# Patient Record
Sex: Female | Born: 1985 | Race: White | Hispanic: No | Marital: Married | Smoking: Never smoker
Health system: Southern US, Community
[De-identification: ages and names within clinical notes are randomized; demographics above are authoritative.]

## PROBLEM LIST (undated history)

## (undated) ENCOUNTER — Ambulatory Visit: Discharge status: Against Medical Advice | Payer: 59

## (undated) ENCOUNTER — Inpatient Hospital Stay: Payer: Self-pay

## (undated) DIAGNOSIS — R112 Nausea with vomiting, unspecified: Secondary | ICD-10-CM

## (undated) DIAGNOSIS — Z9889 Other specified postprocedural states: Secondary | ICD-10-CM

## (undated) DIAGNOSIS — O139 Gestational [pregnancy-induced] hypertension without significant proteinuria, unspecified trimester: Secondary | ICD-10-CM

## (undated) DIAGNOSIS — K219 Gastro-esophageal reflux disease without esophagitis: Secondary | ICD-10-CM

## (undated) DIAGNOSIS — T7840XA Allergy, unspecified, initial encounter: Secondary | ICD-10-CM

## (undated) DIAGNOSIS — R569 Unspecified convulsions: Secondary | ICD-10-CM

## (undated) DIAGNOSIS — R519 Headache, unspecified: Secondary | ICD-10-CM

## (undated) DIAGNOSIS — Z5189 Encounter for other specified aftercare: Secondary | ICD-10-CM

## (undated) DIAGNOSIS — Z789 Other specified health status: Secondary | ICD-10-CM

## (undated) DIAGNOSIS — D696 Thrombocytopenia, unspecified: Secondary | ICD-10-CM

## (undated) DIAGNOSIS — F419 Anxiety disorder, unspecified: Secondary | ICD-10-CM

## (undated) DIAGNOSIS — O99119 Other diseases of the blood and blood-forming organs and certain disorders involving the immune mechanism complicating pregnancy, unspecified trimester: Secondary | ICD-10-CM

## (undated) HISTORY — DX: Other specified postprocedural states: Z98.890

## (undated) HISTORY — DX: Thrombocytopenia, unspecified: D69.6

## (undated) HISTORY — DX: Allergy, unspecified, initial encounter: T78.40XA

## (undated) HISTORY — PX: OTHER SURGICAL HISTORY: SHX169

## (undated) HISTORY — DX: Unspecified convulsions: R56.9

## (undated) HISTORY — DX: Gastro-esophageal reflux disease without esophagitis: K21.9

## (undated) HISTORY — DX: Anxiety disorder, unspecified: F41.9

## (undated) HISTORY — DX: Encounter for other specified aftercare: Z51.89

## (undated) HISTORY — DX: Other diseases of the blood and blood-forming organs and certain disorders involving the immune mechanism complicating pregnancy, unspecified trimester: O99.119

## (undated) HISTORY — PX: MENISCUS REPAIR: SHX5179

## (undated) HISTORY — PX: ANTERIOR CRUCIATE LIGAMENT REPAIR: SHX115

---
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
## 2013-12-05 ENCOUNTER — Ambulatory Visit: Payer: Self-pay | Admitting: Unknown Physician Specialty

## 2015-07-04 HISTORY — PX: WISDOM TOOTH EXTRACTION: SHX21

## 2017-04-12 LAB — OB RESULTS CONSOLE VARICELLA ZOSTER ANTIBODY, IGG: Varicella: NON-IMMUNE/NOT IMMUNE

## 2017-04-12 LAB — OB RESULTS CONSOLE RUBELLA ANTIBODY, IGM: Rubella: IMMUNE

## 2017-04-12 LAB — HM PAP SMEAR: HM Pap smear: NEGATIVE

## 2017-04-12 LAB — OB RESULTS CONSOLE HEPATITIS B SURFACE ANTIGEN: Hepatitis B Surface Ag: NEGATIVE

## 2017-05-07 ENCOUNTER — Inpatient Hospital Stay: Admission: RE | Admit: 2017-05-07 | Payer: Self-pay | Source: Ambulatory Visit

## 2017-05-10 ENCOUNTER — Encounter
Admission: RE | Admit: 2017-05-10 | Discharge: 2017-05-10 | Disposition: A | Discharge status: Home / Self Care | Payer: Self-pay | Source: Ambulatory Visit | Attending: Anesthesiology | Admitting: Anesthesiology

## 2017-05-10 NOTE — Consult Note (Signed)
S: Patient is a 31 yo female who is [redacted] weeks pregnant who is presenting for anesthesia evaluation secondary to a BMI of 47.5.  O:  Patient is a pleasant, morbidly obese female in NAD.  Airway exam is notable for MP1, TM distance of greater than 3 finger breadths, and full neck ROM.  Lungs are CTAB, Pulses are regular and palpable.  A/P:  31 yo female at 2214 weeks gestation. - This [facility], however we discussed that if she were to gain much more weight that this may not likely be the appropriate facility for her in the future.  Will need to see again at approximately 30 weeks to reevaluate.

## 2017-05-21 ENCOUNTER — Ambulatory Visit: Payer: Self-pay | Admitting: Dietician

## 2017-05-28 ENCOUNTER — Ambulatory Visit
Admission: RE | Admit: 2017-05-28 | Discharge: 2017-05-28 | Disposition: A | Discharge status: Home / Self Care | Payer: 59 | Source: Ambulatory Visit | Attending: Obstetrics & Gynecology | Admitting: Obstetrics & Gynecology

## 2017-05-28 ENCOUNTER — Encounter: Payer: Self-pay | Admitting: *Deleted

## 2017-05-28 DIAGNOSIS — R7989 Other specified abnormal findings of blood chemistry: Secondary | ICD-10-CM | POA: Insufficient documentation

## 2017-05-28 DIAGNOSIS — O99210 Obesity complicating pregnancy, unspecified trimester: Secondary | ICD-10-CM

## 2017-05-28 DIAGNOSIS — E229 Hyperfunction of pituitary gland, unspecified: Secondary | ICD-10-CM

## 2017-05-28 HISTORY — DX: Other specified health status: Z78.9

## 2017-05-28 NOTE — Progress Notes (Signed)
Maternal-Fetal Medicine Consultation:  Referring Practice[facility]  Reason for Review: Elevated BMI in pregnancy and review thyroid studies  Danielle Sanchez is a 31 year-old G1 P0 at 2216 3/7 [facility] who presents for MFM consultation [facility] to discuss elevated BMI in pregnancy and to review her thyroid function tests.    She had an EKG that showed NSR but poor R wave progession. She was seen by cardiology and no further workup was recommended.    In addition, in late August she had a prolactin level draw that was mildly increased. It was done early in her pregnancy and due to having clear breast discharge. She denies abnormal cycles or breast discharge prior to this.  She currently is not having breast discharge. She denies headaches or visual field defects  Danielle Sanchez is seen with her mother. She has no complaints.    PMH: Febrile seizure at age 31; none since PSH:  ACL repair, wrist surgery PObH: G1 P0 PGyn: Denies history of abnormal paps or STDs Meds: PNV, Folic acid 1 mg, ASA 81 mg daily All: Azithromycin FH: No FH of babies with genetic disorders or birth defects ROS: No complaints. No breast discharge. No vaginal bleeding. No sick contacts.   Exam: Vitals:   05/28/17 0850  BP: 114/63  Pulse: 68  Temp: 97.8 F (36.6 C)  SpO2: 98%   Filed Weights   05/28/17 0850  Weight: 274 lb 3.2 oz (124.4 kg)   Body mass index is 47.07 kg/m.  Labs: 02/23/17: Prolactin 35.5 (early pregnancy) 04/12/17: pap normal, HPV neg, Blood type O negative, antibody screen neg, RPR non-reactive, HIV non-reactive, Rubella immune, Varicella non-immue, Hep B neg, Hct 39.4, MCV 82, CR 0.7, AST 15, ALT 13, bili 0.3, urine P/C 66 mg.   Three hour GTT 88/174/115/94  05/10/17: TSH 3.08, Free T4 0.81, thyroid peroxidase antibody neg, anti-thyroglobulin ab neg.   Assessment and Recommendations: Danielle Sanchez is a 31 year-old G1 P0 at 4116 3/[redacted] weeks gestation whose  pregnancy is complicated by an elevated BMI.   We discussed the impact of maternal obesity on pregnancy outcomes. We discussed increased risk for need for labor induction, oxytocin augmentation, post-dates pregnancy, abnormal labor and cesarean delivery. Should she need a cesarean delivery there is increased risk for wound complications.  Furhtermore, she is at increased risk for medical complications of pregnancy such as hypertensive disorders and gestational diabetes. Lastly, she is at risk for fetal/neonatal complications such as fetal demise and macrosomia.  Her baseline preeclampsia and diabetes labs are normal.  We discussed weight gain goals and exercise.  Her current free T4 is normal and her TSH is also in the normal range. May consider repeat testing later in pregnancy. It is reassuring that she is also asymptomatic. In addition, a large MFMU trial recently showed that treating subclinical hypothyroidism (elevated TSH, normal free T4) does not improve neonatal outcomes.   -Can consider repeating TSH and Free T4 in the second trimester -Continue PNV, folic acid, and daily baby ASA -Recommend detailed US of fetal anatomy at 18-20 weeks. We will be happy to perform if desired. Follow fetal growth with US. Starting at 28 weeks and then monthly -Recommend weekly fetal testing in the last month of pregnancy and delivery by due date -Repeat diabetes screen at 26-28 weeks. -Has endocrine evaluation scheduled in January. She may need to wait until after she delivers and stops breast feeding before repeat prolactin level can be drawn. It may  have been slightly elevated due to early pregnancy. It is reassuring that she is no longer having breast discharge and is also neurologically asymptomatic -Pt is aware that she is varicella non-immune and will need to call her OB provider should she be exposed to chicken pox. If exposed, we will need to see if she is a candidate for varicella immune globulin. -Rh  neg. Pt knows to [facility] with any vaginal bleeding and that she will need Rhogam at 28 weeks.  Damian Buckles, [place] A, MD

## 2017-07-05 DIAGNOSIS — R7989 Other specified abnormal findings of blood chemistry: Secondary | ICD-10-CM | POA: Diagnosis not present

## 2017-07-20 DIAGNOSIS — E221 Hyperprolactinemia: Secondary | ICD-10-CM | POA: Diagnosis not present

## 2017-07-20 DIAGNOSIS — Z3A24 24 weeks gestation of pregnancy: Secondary | ICD-10-CM | POA: Diagnosis not present

## 2017-08-16 DIAGNOSIS — Z23 Encounter for immunization: Secondary | ICD-10-CM | POA: Diagnosis not present

## 2017-08-16 DIAGNOSIS — R300 Dysuria: Secondary | ICD-10-CM | POA: Diagnosis not present

## 2017-08-16 DIAGNOSIS — Z3400 Encounter for supervision of normal first pregnancy, unspecified trimester: Secondary | ICD-10-CM | POA: Diagnosis not present

## 2017-08-16 DIAGNOSIS — O26893 Other specified pregnancy related conditions, third trimester: Secondary | ICD-10-CM | POA: Diagnosis not present

## 2017-08-16 DIAGNOSIS — N898 Other specified noninflammatory disorders of vagina: Secondary | ICD-10-CM | POA: Diagnosis not present

## 2017-08-16 DIAGNOSIS — O99213 Obesity complicating pregnancy, third trimester: Secondary | ICD-10-CM | POA: Diagnosis not present

## 2017-08-16 DIAGNOSIS — O09893 Supervision of other high risk pregnancies, third trimester: Secondary | ICD-10-CM | POA: Diagnosis not present

## 2017-08-16 DIAGNOSIS — Z6791 Unspecified blood type, Rh negative: Secondary | ICD-10-CM | POA: Diagnosis not present

## 2017-08-16 DIAGNOSIS — O0992 Supervision of high risk pregnancy, unspecified, second trimester: Secondary | ICD-10-CM | POA: Diagnosis not present

## 2017-08-20 DIAGNOSIS — R7302 Impaired glucose tolerance (oral): Secondary | ICD-10-CM | POA: Diagnosis not present

## 2017-08-20 DIAGNOSIS — Z3400 Encounter for supervision of normal first pregnancy, unspecified trimester: Secondary | ICD-10-CM | POA: Diagnosis not present

## 2017-09-14 DIAGNOSIS — Z6841 Body Mass Index (BMI) 40.0 and over, adult: Secondary | ICD-10-CM | POA: Diagnosis not present

## 2017-09-14 DIAGNOSIS — O99213 Obesity complicating pregnancy, third trimester: Secondary | ICD-10-CM | POA: Diagnosis not present

## 2017-09-24 ENCOUNTER — Observation Stay
Admission: EM | Admit: 2017-09-24 | Discharge: 2017-09-24 | Disposition: A | Discharge status: Home / Self Care | Payer: BLUE CROSS/BLUE SHIELD | Attending: Obstetrics and Gynecology | Admitting: Obstetrics and Gynecology

## 2017-09-24 ENCOUNTER — Other Ambulatory Visit: Payer: Self-pay

## 2017-09-24 DIAGNOSIS — O368130 Decreased fetal movements, third trimester, not applicable or unspecified: Principal | ICD-10-CM | POA: Insufficient documentation

## 2017-09-24 DIAGNOSIS — Z3A35 35 weeks gestation of pregnancy: Secondary | ICD-10-CM | POA: Diagnosis not present

## 2017-09-24 DIAGNOSIS — E229 Hyperfunction of pituitary gland, unspecified: Secondary | ICD-10-CM

## 2017-09-24 DIAGNOSIS — Z3490 Encounter for supervision of normal pregnancy, unspecified, unspecified trimester: Secondary | ICD-10-CM

## 2017-09-24 DIAGNOSIS — R7989 Other specified abnormal findings of blood chemistry: Secondary | ICD-10-CM

## 2017-09-24 DIAGNOSIS — O99210 Obesity complicating pregnancy, unspecified trimester: Secondary | ICD-10-CM

## 2017-09-24 NOTE — OB Triage Note (Signed)
Pt sent from office for NST. Denies LOf or bleeding. Reports minimal fetal movement today.vitals WNL, Will continue to monitor.

## 2017-09-24 NOTE — Discharge Summary (Signed)
Notified provider of category 1 reactive strip with no decelerations and an occasional mild contraction. Pt unaware of contraction. Provider said to discharge the patient home. Reviewed discharge instructions with patient. Educated patient on fetal kick counts. Gave patient opportunity for questions. All questions answered. Pt verbalized understanding and discharged home with her husband.

## 2017-10-01 ENCOUNTER — Encounter
Admission: RE | Admit: 2017-10-01 | Discharge: 2017-10-01 | Disposition: A | Discharge status: Home / Self Care | Payer: BLUE CROSS/BLUE SHIELD | Source: Ambulatory Visit | Attending: Certified Nurse Midwife | Admitting: Certified Nurse Midwife

## 2017-10-01 NOTE — Consult Note (Signed)
[facility] Anesthesia Consultation  Danielle OfficerKristie Eisenbeis ZOX:[id number] DOB: 10/28/1985 DOA: 10/01/2017 PCP: Sharee PimpleJones, Caron W, CNM   Requesting physician: Yetta Barrejones Date of consultation: [id number] Reason for consultation: Obesity during pregnancy  CHIEF COMPLAINT:  Obesity during pregnancy  HISTORY OF PRESENT ILLNESS: Danielle OfficerKristie Kessinger  is a 32 y.o. female with a known history of obesity in pregnancy   PAST MEDICAL HISTORY:   Past Medical History:  Diagnosis Date  . Medical history non-contributory     PAST SURGICAL HISTORY:  Past Surgical History:  Procedure Laterality Date  . ANTERIOR CRUCIATE LIGAMENT REPAIR Right   . bone spur Left   . MENISCUS REPAIR Right     SOCIAL HISTORY:  Social History   Tobacco Use  . Smoking status: Never Smoker  . Smokeless tobacco: Never Used  Substance Use Topics  . Alcohol use: No    Frequency: Never    FAMILY HISTORY: No family history on file.  DRUG ALLERGIES:  Allergies  Allergen Reactions  . Azithromycin Swelling    REVIEW OF SYSTEMS:   RESPIRATORY: No cough, shortness of breath, wheezing.  CARDIOVASCULAR: No chest pain, orthopnea, edema.  HEMATOLOGY: No anemia, easy bruising or bleeding SKIN: No rash or lesion. NEUROLOGIC: No tingling, numbness, weakness.  PSYCHIATRY: No anxiety or depression.   MEDICATIONS AT HOME:  Prior to Admission medications   Medication Sig Start Date End Date Taking? Authorizing Provider  aspirin 81 MG chewable tablet Chew by mouth daily.    [provider]  Prenatal Vit-Fe Fumarate-FA (PRENATAL MULTIVITAMIN) TABS tablet Take 1 tablet by mouth daily at 12 noon.    [provider]  ranitidine (ZANTAC) 150 MG tablet Take 150 mg by mouth 2 (two) times daily.    [provider]      PHYSICAL EXAMINATION:   VITAL SIGNS: Last menstrual period 01/30/2017.  GENERAL:  32 y.o.-year-old patient no acute distress.  HEENT: Head atraumatic, normocephalic.  Oropharynx and nasopharynx clear. MP 3, TM distance >3 cm, normal mouth opening. LUNGS: Normal breath sounds bilaterally, no wheezing, rales,rhonchi or crepitation. No use of accessory muscles of respiration.  CARDIOVASCULAR: S1, S2 normal. No murmurs, rubs, or gallops.  EXTREMITIES: No pedal edema, cyanosis, or clubbing.  NEUROLOGIC: normal gait PSYCHIATRIC: The patient is alert and oriented x 3.  SKIN: No obvious rash, lesion, or ulcer.    IMPRESSION AND PLAN:   Danielle Sanchez  is a 32 y.o. female presenting with obesity during pregnancy. BMI is currently 52 at [redacted] weeks gestation.   We discussed analgesic options during labor including epidural analgesia. Discussed that in obesity there can be increased difficulty with epidural placement or even failure of successful epidural. We also discussed that even after successful epidural placement there is increased risk of catheter migration out of the epidural space that would require catheter replacement. Discussed increased risk of difficult intubation during pregnancy should an emergency cesarean delivery be required.   We discussed that should the patient's BMI exceed 49 at [redacted] weeks gestation she will require an evaluation by anesthesia to determine whether there is a high risk of complications of anesthesia that would necessitate transfer of OB care to a facility with a higher maternal level of care designation.

## 2017-10-03 NOTE — Final Progress Note (Signed)
Triage Visit with NST    Delma OfficerKristie Dilmore is a 32 y.o. G1P0. She is at 2650w1d gestation.  Indication: Decreased fetal movement  S: Resting comfortably. no CTX, no VB.  - Patient is now feeling baby  Move well.   :  BP 130/73 (BP Location: Left Arm)   Pulse 75   Temp 98.1 F (36.7 C) (Oral)   Resp 18   Ht 5\' 4"  (1.626 m)   Wt 132.9 kg (293 lb)   LMP 01/30/2017   SpO2 98%   BMI 50.29 kg/m  No results found for this or any previous visit (from the past 48 hour(s)).   Gen: NAD, AAOx3      Abd: FNTTP      Ext: Non-tender, Nonedmeatous    FHT: 130, moderate variability, +accels, no decels TOCO: quiet SVE: deferred   A/P:  32 y.o. G1P0 3450w1d with concerns for decreased fetal movement, now resolved.   Labor: not present.   Fetal Wellbeing: NST is Reassuring reactive tracing   D/c home stable, precautions reviewed, follow-up as scheduled.

## 2017-10-12 DIAGNOSIS — O0993 Supervision of high risk pregnancy, unspecified, third trimester: Secondary | ICD-10-CM | POA: Diagnosis not present

## 2017-10-12 LAB — OB RESULTS CONSOLE GC/CHLAMYDIA
CHLAMYDIA, DNA PROBE: NEGATIVE
Gonorrhea: NEGATIVE

## 2017-10-12 LAB — OB RESULTS CONSOLE GBS: STREP GROUP B AG: POSITIVE

## 2017-10-12 LAB — OB RESULTS CONSOLE HIV ANTIBODY (ROUTINE TESTING): HIV: NONREACTIVE

## 2017-10-12 LAB — HM HIV SCREENING LAB: HM HIV Screening: NEGATIVE

## 2017-10-12 LAB — OB RESULTS CONSOLE RPR: RPR: NONREACTIVE

## 2017-10-17 DIAGNOSIS — O99210 Obesity complicating pregnancy, unspecified trimester: Secondary | ICD-10-CM | POA: Diagnosis not present

## 2017-10-18 ENCOUNTER — Observation Stay
Admission: EM | Admit: 2017-10-18 | Discharge: 2017-10-18 | Disposition: A | Discharge status: Home / Self Care | Payer: BLUE CROSS/BLUE SHIELD | Attending: Obstetrics and Gynecology | Admitting: Obstetrics and Gynecology

## 2017-10-18 DIAGNOSIS — R030 Elevated blood-pressure reading, without diagnosis of hypertension: Secondary | ICD-10-CM | POA: Diagnosis not present

## 2017-10-18 DIAGNOSIS — O99213 Obesity complicating pregnancy, third trimester: Secondary | ICD-10-CM | POA: Insufficient documentation

## 2017-10-18 DIAGNOSIS — Z7982 Long term (current) use of aspirin: Secondary | ICD-10-CM | POA: Insufficient documentation

## 2017-10-18 DIAGNOSIS — Z3490 Encounter for supervision of normal pregnancy, unspecified, unspecified trimester: Secondary | ICD-10-CM

## 2017-10-18 DIAGNOSIS — O26893 Other specified pregnancy related conditions, third trimester: Secondary | ICD-10-CM | POA: Diagnosis not present

## 2017-10-18 DIAGNOSIS — E229 Hyperfunction of pituitary gland, unspecified: Secondary | ICD-10-CM

## 2017-10-18 DIAGNOSIS — Z79899 Other long term (current) drug therapy: Secondary | ICD-10-CM | POA: Insufficient documentation

## 2017-10-18 DIAGNOSIS — Z3A38 38 weeks gestation of pregnancy: Secondary | ICD-10-CM | POA: Diagnosis not present

## 2017-10-18 DIAGNOSIS — O99210 Obesity complicating pregnancy, unspecified trimester: Secondary | ICD-10-CM

## 2017-10-18 DIAGNOSIS — O9989 Other specified diseases and conditions complicating pregnancy, childbirth and the puerperium: Secondary | ICD-10-CM | POA: Diagnosis not present

## 2017-10-18 DIAGNOSIS — R7989 Other specified abnormal findings of blood chemistry: Secondary | ICD-10-CM

## 2017-10-18 LAB — COMPREHENSIVE METABOLIC PANEL
ALBUMIN: 2.5 g/dL — AB (ref 3.5–5.0)
ALK PHOS: 99 U/L (ref 38–126)
ALT: 15 U/L (ref 14–54)
ANION GAP: 4 — AB (ref 5–15)
AST: 23 U/L (ref 15–41)
BILIRUBIN TOTAL: 0.4 mg/dL (ref 0.3–1.2)
BUN: 8 mg/dL (ref 6–20)
CALCIUM: 8.5 mg/dL — AB (ref 8.9–10.3)
CO2: 22 mmol/L (ref 22–32)
CREATININE: 0.67 mg/dL (ref 0.44–1.00)
Chloride: 108 mmol/L (ref 101–111)
GFR calc Af Amer: >60 mL/min (ref >60)
GFR calc non Af Amer: >60 mL/min (ref >60)
GLUCOSE: 136 mg/dL — AB (ref 65–99)
Potassium: 3.6 mmol/L (ref 3.5–5.1)
Sodium: 134 mmol/L — ABNORMAL LOW (ref 135–145)
TOTAL PROTEIN: 5.8 g/dL — AB (ref 6.5–8.1)

## 2017-10-18 LAB — PROTEIN / CREATININE RATIO, URINE
Creatinine, Urine: 197 mg/dL
PROTEIN CREATININE RATIO: 0.16 mg/mg{creat} — AB (ref 0.00–0.15)
Total Protein, Urine: 31 mg/dL

## 2017-10-18 LAB — CBC
HEMATOCRIT: 35.7 % (ref 35.0–47.0)
HEMOGLOBIN: 12.1 g/dL (ref 12.0–16.0)
MCH: 28.7 pg (ref 26.0–34.0)
MCHC: 33.7 g/dL (ref 32.0–36.0)
MCV: 85.2 fL (ref 80.0–100.0)
Platelets: 137 10*3/uL — ABNORMAL LOW (ref 150–440)
RBC: 4.19 MIL/uL (ref 3.80–5.20)
RDW: 14.7 % — ABNORMAL HIGH (ref 11.5–14.5)
WBC: 13.3 10*3/uL — ABNORMAL HIGH (ref 3.6–11.0)

## 2017-10-18 MED ORDER — LABETALOL HCL 5 MG/ML IV SOLN: 20.0000 mg | INTRAVENOUS | Status: DC | PRN

## 2017-10-18 MED ORDER — HYDRALAZINE HCL 20 MG/ML IJ SOLN: 10.0000 mg | Freq: Once | INTRAMUSCULAR | Status: DC | PRN

## 2017-10-18 NOTE — OB Triage Note (Signed)
G1P0 5914w2d [facility]. Denies h/a, vision changes and epigastric pain. Pateller reflex +3, clonus absent. BP 144/79 on triage.

## 2017-10-18 NOTE — Final Progress Note (Signed)
TRIAGE VISIT with NST   Danielle OfficerKristie Sanchez is a 32 y.o. G1P0. She is at 779w2d gestation.  Indication: Elevated Blood Pressure  S: Resting comfortably. no CTX, no VB. Active fetal movement. Denies headache, SOB, new onset swelling, RUQ pain, visual changes. O:  BP (!) 133/91   Pulse 92   Resp 18   Ht 5\' 4"  (1.626 m)   Wt (!) 141.5 kg (312 lb)   LMP 01/30/2017   BMI 53.55 kg/m  Results for orders placed or performed during the hospital encounter of 10/18/17 (from the past 48 hour(s))  Protein / creatinine ratio, urine   Collection Time: 10/18/17 11:32 AM  Result Value Ref Range   Creatinine, Urine 197 mg/dL   Total Protein, Urine 31 mg/dL   Protein Creatinine Ratio 0.16 (H) 0.00 - 0.15 mg/mg[Cre]  CBC   Collection Time: 10/18/17 12:12 PM  Result Value Ref Range   WBC 13.3 (H) 3.6 - 11.0 K/uL   RBC 4.19 3.80 - 5.20 MIL/uL   Hemoglobin 12.1 12.0 - 16.0 g/dL   HCT 16.135.7 09.635.0 - 04.547.0 %   MCV 85.2 80.0 - 100.0 fL   MCH 28.7 26.0 - 34.0 pg   MCHC 33.7 32.0 - 36.0 g/dL   RDW 40.914.7 (H) 81.111.5 - 91.414.5 %   Platelets 137 (L) 150 - 440 K/uL  Comprehensive metabolic panel   Collection Time: 10/18/17 12:12 PM  Result Value Ref Range   Sodium 134 (L) 135 - 145 mmol/L   Potassium 3.6 3.5 - 5.1 mmol/L   Chloride 108 101 - 111 mmol/L   CO2 22 22 - 32 mmol/L   Glucose, Bld 136 (H) 65 - 99 mg/dL   BUN 8 6 - 20 mg/dL   Creatinine, Ser 7.820.67 0.44 - 1.00 mg/dL   Calcium 8.5 (L) 8.9 - 10.3 mg/dL   Total Protein 5.8 (L) 6.5 - 8.1 g/dL   Albumin 2.5 (L) 3.5 - 5.0 g/dL   AST 23 15 - 41 U/L   ALT 15 14 - 54 U/L   Alkaline Phosphatase 99 38 - 126 U/L   Total Bilirubin 0.4 0.3 - 1.2 mg/dL   GFR calc non Af Amer >60 >60 mL/min   GFR calc Af Amer >60 >60 mL/min   Anion gap 4 (L) 5 - 15     Gen: NAD, AAOx3   Patellar reflexes 1+, no clonus Pulm: Lungs CTAB    Abd: FNTTP      Ext: Non-tender, Nonedmeatous   FHT: 140, mod var, +accels no decels TOCO: quiet SVE:  deferred  NST: Category I strip, see  detailed evaluation above  A/P:  32 y.o. G1P0 5779w2d with elevated BP in the office.                        Labor: not present.   R/o PreEclampsia: labs all wnl. P:C 160. BP not in mild or severe range. Full discussion of precautions.  F/u in office now twice weekly for NST/AFI  Anesthesia reviewed with patient's airway, and have cleared her for delivery here.  Plan for iol at 39wks for overnight cervical ripening. On the books for April 30 at 8pm    Fetal Wellbeing: Reassuring Cat 1 tracing.  D/c home stable, precautions reviewed, follow-up as scheduled.

## 2017-10-18 NOTE — Discharge Instructions (Signed)
Notify provider for blood pressures 140s/90s. Come to ED for systolic Blood pressure greater than 160 or diastolic BP rgeater than 110 (I.e. > 160s/110s)

## 2017-10-18 NOTE — Progress Notes (Signed)
[facility] Anesthesia Consultation  Danielle OfficerKristie Sanchez FAO:[id number] DOB: 05/22/1986 DOA: 10/18/2017 PCP: Sharee PimpleJones, Caron W, CNM   Requesting physician: Malena EdmanBeasly` Date of consultation: 10/18/2017 Reason for consultation: Obesity during pregnancy  CHIEF COMPLAINT:  Obesity during pregnancy  HISTORY OF PRESENT ILLNESS: Danielle Sanchez  is a 32 y.o. female with a known history of obesity with pregnancy.    PAST MEDICAL HISTORY:   Past Medical History:  Diagnosis Date  . Medical history non-contributory     PAST SURGICAL HISTORY:  Past Surgical History:  Procedure Laterality Date  . ANTERIOR CRUCIATE LIGAMENT REPAIR Right   . bone spur Left   . MENISCUS REPAIR Right     SOCIAL HISTORY:  Social History   Tobacco Use  . Smoking status: Never Smoker  . Smokeless tobacco: Never Used  Substance Use Topics  . Alcohol use: No    Frequency: Never    FAMILY HISTORY: History reviewed. No pertinent family history.  DRUG ALLERGIES:  Allergies  Allergen Reactions  . Azithromycin Swelling    REVIEW OF SYSTEMS:   RESPIRATORY: No cough, shortness of breath, wheezing.  CARDIOVASCULAR: No chest pain, orthopnea, edema.  HEMATOLOGY: No anemia, easy bruising or bleeding SKIN: No rash or lesion. NEUROLOGIC: No tingling, numbness, weakness.  PSYCHIATRY: No anxiety or depression.   MEDICATIONS AT HOME:  Prior to Admission medications   Medication Sig Start Date End Date Taking? Authorizing Provider  aspirin 81 MG chewable tablet Chew by mouth daily.   Yes [provider]  Prenatal Vit-Fe Fumarate-FA (PRENATAL MULTIVITAMIN) TABS tablet Take 1 tablet by mouth daily at 12 noon.   Yes [provider]  ranitidine (ZANTAC) 150 MG tablet Take 150 mg by mouth 2 (two) times daily.   Yes [provider]      PHYSICAL EXAMINATION:   VITAL SIGNS: Blood pressure (!) 133/91, pulse 92, resp. rate 18, height 5\' 4"  (1.626 m), weight (!) 312 lb  (141.5 kg), last menstrual period 01/30/2017.  GENERAL:  32 y.o.-year-old patient no acute distress.    IMPRESSION AND PLAN:   Danielle Sanchez  is a 32 y.o. female presenting with obesity during pregnancy. BMI is currently 53.5 at [redacted] weeks gestation.   We discussed analgesic options during labor including epidural analgesia. Discussed that in obesity there can be increased difficulty with epidural placement or even failure of successful epidural. We also discussed that even after successful epidural placement there is increased risk of catheter migration out of the epidural space that would require catheter replacement. Discussed increased risk of difficult intubation during pregnancy should an emergency cesarean delivery be required.   Based on today's evaluation she should comply with our criteria for induction as long as her weight remains below a BMI of 55 and her airway exam remains favorable.  Dr. Pernell DupreAdams

## 2017-10-22 ENCOUNTER — Inpatient Hospital Stay
Admission: EM | Admit: 2017-10-22 | Discharge: 2017-10-22 | Disposition: A | Discharge status: Home / Self Care | Payer: BLUE CROSS/BLUE SHIELD | Attending: Obstetrics and Gynecology | Admitting: Obstetrics and Gynecology

## 2017-10-22 DIAGNOSIS — E229 Hyperfunction of pituitary gland, unspecified: Secondary | ICD-10-CM

## 2017-10-22 DIAGNOSIS — Z3A37 37 weeks gestation of pregnancy: Secondary | ICD-10-CM | POA: Diagnosis not present

## 2017-10-22 DIAGNOSIS — O99213 Obesity complicating pregnancy, third trimester: Secondary | ICD-10-CM | POA: Diagnosis not present

## 2017-10-22 DIAGNOSIS — Z881 Allergy status to other antibiotic agents status: Secondary | ICD-10-CM | POA: Insufficient documentation

## 2017-10-22 DIAGNOSIS — Z8759 Personal history of other complications of pregnancy, childbirth and the puerperium: Secondary | ICD-10-CM | POA: Diagnosis not present

## 2017-10-22 DIAGNOSIS — R7989 Other specified abnormal findings of blood chemistry: Secondary | ICD-10-CM

## 2017-10-22 DIAGNOSIS — O133 Gestational [pregnancy-induced] hypertension without significant proteinuria, third trimester: Secondary | ICD-10-CM | POA: Insufficient documentation

## 2017-10-22 DIAGNOSIS — O139 Gestational [pregnancy-induced] hypertension without significant proteinuria, unspecified trimester: Secondary | ICD-10-CM

## 2017-10-22 DIAGNOSIS — Z7982 Long term (current) use of aspirin: Secondary | ICD-10-CM | POA: Diagnosis not present

## 2017-10-22 DIAGNOSIS — O99210 Obesity complicating pregnancy, unspecified trimester: Secondary | ICD-10-CM

## 2017-10-22 DIAGNOSIS — R030 Elevated blood-pressure reading, without diagnosis of hypertension: Secondary | ICD-10-CM | POA: Diagnosis not present

## 2017-10-22 DIAGNOSIS — O26893 Other specified pregnancy related conditions, third trimester: Secondary | ICD-10-CM | POA: Diagnosis not present

## 2017-10-22 LAB — COMPREHENSIVE METABOLIC PANEL
ALBUMIN: 2.7 g/dL — AB (ref 3.5–5.0)
ALK PHOS: 109 U/L (ref 38–126)
ALT: 18 U/L (ref 14–54)
AST: 27 U/L (ref 15–41)
Anion gap: 6 (ref 5–15)
BILIRUBIN TOTAL: 0.3 mg/dL (ref 0.3–1.2)
BUN: 9 mg/dL (ref 6–20)
CALCIUM: 8.7 mg/dL — AB (ref 8.9–10.3)
CO2: 23 mmol/L (ref 22–32)
CREATININE: 0.72 mg/dL (ref 0.44–1.00)
Chloride: 106 mmol/L (ref 101–111)
GFR calc Af Amer: >60 mL/min (ref >60)
GFR calc non Af Amer: >60 mL/min (ref >60)
GLUCOSE: 78 mg/dL (ref 65–99)
Potassium: 4.1 mmol/L (ref 3.5–5.1)
Sodium: 135 mmol/L (ref 135–145)
TOTAL PROTEIN: 6 g/dL — AB (ref 6.5–8.1)

## 2017-10-22 LAB — CBC
HEMATOCRIT: 40.5 % (ref 35.0–47.0)
Hemoglobin: 13.2 g/dL (ref 12.0–16.0)
MCH: 27.9 pg (ref 26.0–34.0)
MCHC: 32.6 g/dL (ref 32.0–36.0)
MCV: 85.7 fL (ref 80.0–100.0)
PLATELETS: 152 10*3/uL (ref 150–440)
RBC: 4.72 MIL/uL (ref 3.80–5.20)
RDW: 15.2 % — AB (ref 11.5–14.5)
WBC: 11.6 10*3/uL — ABNORMAL HIGH (ref 3.6–11.0)

## 2017-10-22 LAB — PROTEIN / CREATININE RATIO, URINE
CREATININE, URINE: 247 mg/dL
PROTEIN CREATININE RATIO: 0.17 mg/mg{creat} — AB (ref 0.00–0.15)
TOTAL PROTEIN, URINE: 42 mg/dL

## 2017-10-22 MED ORDER — HYDRALAZINE HCL 20 MG/ML IJ SOLN: 10.0000 mg | Freq: Once | INTRAMUSCULAR | Status: DC | PRN

## 2017-10-22 MED ORDER — LABETALOL HCL 5 MG/ML IV SOLN: 20.0000 mg | INTRAVENOUS | Status: DC | PRN

## 2017-10-22 NOTE — Discharge Instructions (Signed)
Labor Induction Labor induction is when steps are taken to cause a pregnant woman to begin the labor process. Most women go into labor on their own between 37 weeks and 42 weeks of the pregnancy. When this does not happen or when there is a medical need, methods may be used to induce labor. Labor induction causes a pregnant woman's uterus to contract. It also causes the cervix to soften (ripen), open (dilate), and thin out (efface). Usually, labor is not induced before 39 weeks of the pregnancy unless there is a problem with the baby or mother. Before inducing labor, your health care provider will consider a number of factors, including the following:  The medical condition of you and the baby.  How many weeks along you are.  The status of the baby's lung maturity.  The condition of the cervix.  The position of the baby. What are the reasons for labor induction? Labor may be induced for the following reasons:  The health of the baby or mother is at risk.  The pregnancy is overdue by 1 week or more.  The water breaks but labor does not start on its own.  The mother has a health condition or serious illness, such as high blood pressure, infection, placental abruption, or diabetes.  The amniotic fluid amounts are low around the baby.  The baby is distressed. Convenience or wanting the baby to be born on a certain date is not a reason for inducing labor. What methods are used for labor induction? Several methods of labor induction may be used, such as:  Prostaglandin medicine. This medicine causes the cervix to dilate and ripen. The medicine will also start contractions. It can be taken by mouth or by inserting a suppository into the vagina.  Inserting a thin tube (catheter) with a balloon on the end into the vagina to dilate the cervix. Once inserted, the balloon is expanded with water, which causes the cervix to open.  Stripping the membranes. Your health care provider separates  amniotic sac tissue from the cervix, causing the cervix to be stretched and causing the release of a hormone called progesterone. This may cause the uterus to contract. It is often done during an office visit. You will be sent home to wait for the contractions to begin. You will then come in for an induction.  Breaking the water. Your health care provider makes a hole in the amniotic sac using a small instrument. Once the amniotic sac breaks, contractions should begin. This may still take hours to see an effect.  Medicine to trigger or strengthen contractions. This medicine is given through an IV access tube inserted into a vein in your arm. All of the methods of induction, besides stripping the membranes, will be done in the hospital. Induction is done in the hospital so that you and the baby can be carefully monitored. How long does it take for labor to be induced? Some inductions can take up to 2-3 days. Depending on the cervix, it usually takes less time. It takes longer when you are induced early in the pregnancy or if this is your first pregnancy. If a mother is still pregnant and the induction has been going on for 2-3 days, either the mother will be sent home or a cesarean delivery will be needed. What are the risks associated with labor induction? Some of the risks of induction include:  Changes in fetal heart rate, such as too high, too low, or erratic.  Fetal distress.    Chance of infection for the mother and baby.  Increased chance of having a cesarean delivery.  Breaking off (abruption) of the placenta from the uterus (rare).  Uterine rupture (very rare). When induction is needed for medical reasons, the benefits of induction may outweigh the risks. What are some reasons for not inducing labor? Labor induction should not be done if:  It is shown that your baby does not tolerate labor.  You have had previous surgeries on your uterus, such as a myomectomy or the removal of  fibroids.  Your placenta lies very low in the uterus and blocks the opening of the cervix (placenta previa).  Your baby is not in a head-down position.  The umbilical cord drops down into the birth canal in front of the baby. This could cut off the baby's blood and oxygen supply.  You have had a previous cesarean delivery.  There are unusual circumstances, such as the baby being extremely premature. This information is not intended to replace advice given to you by your health care provider. Make sure you discuss any questions you have with your health care provider. Document Released: 11/08/2006 Document Revised: 11/25/2015 Document Reviewed: 01/16/2013 Elsevier Interactive Patient Education  2017 Elsevier Inc.  

## 2017-10-22 NOTE — OB Triage Note (Signed)
Ms. Danielle Sanchez here with elevated BP in office today, sent over for pre-eclampsia evaluation. Denies headache, vision changes, epigastric pain, reports some swelling in feet, 2+ pitting edema in bilateral lower extremities. Reports positive fetal movement.

## 2017-10-22 NOTE — Discharge Summary (Addendum)
Obstetrical Discharge Summary  Patient Name: Danielle OfficerKristie Sanchez DOB: 01/04/1986 MRN: [id number]  Date of Admission: 10/22/2017 Date of Delivery: 10/22/17 Delivered by: N/A Date of Discharge: 10/22/2017  [facility]  FAO:[id number]:Patient's last menstrual period was 01/30/2017. EDC Estimated Date of Delivery: 11/06/17 Gestational Age at Delivery: 226w6d   Antepartum complications: Gest HTN, Obesity Admitting Diagnosis: IUP at 37 5/7 weeks with Gest HTN Secondary Diagnosis: Patient Active Problem List   Diagnosis Date Noted  . Pregnancy 09/24/2017  . Maternal obesity, antepartum 05/28/2017  . Elevated prolactin level (HCC) 05/28/2017    Augmentation:N/A Complications: BP elevated at office at 140/105 Intrapartum complications/course: N/A Date of Delivery: N/A Delivered By: N/A Delivery Type: N/A Anesthesia: N/A Placenta: N/A Laceration: N/A Episiotomy: none Newborn Data: This patient has no babies on file.   Postpartum Procedures: N/A AP  course:  Patient had normal BP's since being observed in L&D.  By time of discharge, she was ambulating, voiding without difficulty and tolerating regular diet.  She was deemed stable for discharge to home.   No visual changes, no HA's, no RUQ pain or CNS symptoms of pre-ecclampsia.   Discharge Physical Exam:  BP 123/66   Pulse 79   Temp 98.3 F (36.8 C) (Oral)   Resp 18   Ht 5\' 4"  (1.626 m)   Wt (!) 312 lb (141.5 kg)   LMP 01/30/2017   BMI 53.55 kg/m   General: NAD CV: Pulse reg Pulm: Resp reg and non-labored ABD: Gravid DVT Evaluation: LE non-ttp, no evidence of DVT on exam.  Hemoglobin  Date Value Ref Range Status  10/22/2017 13.2 12.0 - 16.0 g/dL Final   HCT  Date Value Ref Range Status  10/22/2017 40.5 35.0 - 47.0 % Final   Labs: P:C ratio is 247, plts 152, AST 27, ALT 18,  Disposition: stable, discharge to home.   Plan:  Danielle Sanchez was discharged to home in good condition. Follow-up appointment in am  for US.   Discharge Medications: Allergies as of 10/22/2017      Reactions   Azithromycin Swelling      Medication List    TAKE these medications   aspirin 81 MG chewable tablet Chew by mouth daily.   prenatal multivitamin Tabs tablet Take 1 tablet by mouth daily at 12 noon.   ranitidine 150 MG tablet Commonly known as:  ZANTAC Take 150 mg by mouth 2 (two) times daily.       Follow-up Information    [facility]. Go to.   Why:  scheduled appointment Contact information: [address] [place] [id number] [phone]          Signed: Myrtie Cruisearon W. Zonnique Norkus,RN, MSN, CNM, FNP Certified Nurse Midwife [facility]

## 2017-10-22 NOTE — Progress Notes (Signed)
Danielle OfficerKristie Sanchez is a 32 y.o. female. She is at 4149w6d gestation.  Indication: Elevated BP's to 141/105, 140/100 in office today  S: Resting comfortably. no CTX, no VB. Active fetal movement.  O:  BP 132/69   Pulse 78   Temp 98.3 F (36.8 C) (Oral)   Resp 18   Ht 5\' 4"  (1.626 m)   Wt (!) 312 lb (141.5 kg)   LMP 01/30/2017   BMI 53.55 kg/m  No results found for this or any previous visit (from the past 48 hour(s)).   Gen: NAD, AAOx3      Abd: FNTTP      Ext: Non-tender, Nonedmeatous    FHT: 135, +accels, no decels, CAt 1,NST reactive with 2 accels 15 x 15 BPM TOCO: quiet SVE: Closed/50%/vtx-3   A/P:    Labor: not present.   Gest HTN now normalizing after serial BP's  Fetal Wellbeing: Reassuring Cat 1 tracing.  D/c home stable, precautions reviewed, follow-up as scheduled.   FU in am for US and BP check  Myrtie Cruisearon W. Baillie Mohammad,RN, MSN, CNM, FNP Certified Nurse Midwife [facility]

## 2017-10-23 DIAGNOSIS — O26893 Other specified pregnancy related conditions, third trimester: Secondary | ICD-10-CM | POA: Diagnosis not present

## 2017-10-23 DIAGNOSIS — R030 Elevated blood-pressure reading, without diagnosis of hypertension: Secondary | ICD-10-CM | POA: Diagnosis not present

## 2017-10-25 ENCOUNTER — Other Ambulatory Visit: Payer: Self-pay

## 2017-10-25 ENCOUNTER — Observation Stay
Admission: EM | Admit: 2017-10-25 | Discharge: 2017-10-25 | Disposition: A | Discharge status: Home / Self Care | Payer: BLUE CROSS/BLUE SHIELD | Attending: Obstetrics and Gynecology | Admitting: Obstetrics and Gynecology

## 2017-10-25 DIAGNOSIS — O99213 Obesity complicating pregnancy, third trimester: Secondary | ICD-10-CM | POA: Diagnosis not present

## 2017-10-25 DIAGNOSIS — Z8759 Personal history of other complications of pregnancy, childbirth and the puerperium: Secondary | ICD-10-CM | POA: Diagnosis present

## 2017-10-25 DIAGNOSIS — Z3A38 38 weeks gestation of pregnancy: Secondary | ICD-10-CM | POA: Diagnosis not present

## 2017-10-25 DIAGNOSIS — O139 Gestational [pregnancy-induced] hypertension without significant proteinuria, unspecified trimester: Secondary | ICD-10-CM | POA: Diagnosis present

## 2017-10-25 DIAGNOSIS — O133 Gestational [pregnancy-induced] hypertension without significant proteinuria, third trimester: Secondary | ICD-10-CM | POA: Diagnosis not present

## 2017-10-25 DIAGNOSIS — Z881 Allergy status to other antibiotic agents status: Secondary | ICD-10-CM | POA: Diagnosis not present

## 2017-10-25 DIAGNOSIS — Z7982 Long term (current) use of aspirin: Secondary | ICD-10-CM | POA: Insufficient documentation

## 2017-10-25 DIAGNOSIS — O0993 Supervision of high risk pregnancy, unspecified, third trimester: Secondary | ICD-10-CM | POA: Diagnosis not present

## 2017-10-25 LAB — COMPREHENSIVE METABOLIC PANEL
ALBUMIN: 2.9 g/dL — AB (ref 3.5–5.0)
ALK PHOS: 105 U/L (ref 38–126)
ALT: 16 U/L (ref 14–54)
AST: 26 U/L (ref 15–41)
Anion gap: 7 (ref 5–15)
BUN: 13 mg/dL (ref 6–20)
CALCIUM: 8.8 mg/dL — AB (ref 8.9–10.3)
CO2: 22 mmol/L (ref 22–32)
CREATININE: 0.75 mg/dL (ref 0.44–1.00)
Chloride: 107 mmol/L (ref 101–111)
GFR calc non Af Amer: >60 mL/min (ref >60)
GLUCOSE: 69 mg/dL (ref 65–99)
Potassium: 3.9 mmol/L (ref 3.5–5.1)
SODIUM: 136 mmol/L (ref 135–145)
Total Bilirubin: 0.4 mg/dL (ref 0.3–1.2)
Total Protein: 6.5 g/dL (ref 6.5–8.1)

## 2017-10-25 LAB — CBC
HCT: 36.6 % (ref 35.0–47.0)
Hemoglobin: 12.1 g/dL (ref 12.0–16.0)
MCH: 28.4 pg (ref 26.0–34.0)
MCHC: 33.1 g/dL (ref 32.0–36.0)
MCV: 85.8 fL (ref 80.0–100.0)
Platelets: 145 10*3/uL — ABNORMAL LOW (ref 150–440)
RBC: 4.27 MIL/uL (ref 3.80–5.20)
RDW: 15.2 % — ABNORMAL HIGH (ref 11.5–14.5)
WBC: 13.2 10*3/uL — ABNORMAL HIGH (ref 3.6–11.0)

## 2017-10-25 LAB — PROTEIN / CREATININE RATIO, URINE
Creatinine, Urine: 62 mg/dL
Protein Creatinine Ratio: 0.23 mg/mg{Cre} — ABNORMAL HIGH (ref 0.00–0.15)
Total Protein, Urine: 14 mg/dL

## 2017-10-25 NOTE — Progress Notes (Signed)
Spoke to lab about 24 hr protein urine collection. Notified that the 24 hour urine would be run "in house" and that it would take 10 min to spin and 30-40 min to run. Notified information to provider. Provider gave verbal order for pt to do a 24 hour urine starting tomorrow morning. CNM gives order for pt to come back Saturday for an NST, PIH labs, and to bring 24 hour urine collection then. Relayed all of this to patient when educating her prior to discharge. Patient verbalized understanding and used teach back method to ensure she understood prior to discharge. NST scheduled for Saturday at 1000.

## 2017-10-25 NOTE — OB Triage Note (Signed)
Pt sent over from MD office due to elevated blood pressures.

## 2017-10-25 NOTE — Discharge Summary (Signed)
Danielle Sanchez is a 32 y.o. female. She is at [redacted]w[redacted]d gestation. Patient's last menstrual period was 01/30/2017. Estimated Date of Delivery: 11/06/17  Prenatal care site: [facility]   Current pregnancy complicated by:  1. GHTN- elevated BPs in office x 1 week; asymptomatic, planned IOL at 39+0 2. Morbid obesity, BMI 47.5; excess wt gain with preg.  3. Varicella Non-immune 4. Rh Negative  Chief complaint: sent from office for elevated BP   S: Resting comfortably. no CTX, no VB.no LOF,  Active fetal movement.  Denies: HA, visual changes, SOB, or RUQ/epigastric pain  Maternal Medical History:   Past Medical History:  Diagnosis Date  . Medical history non-contributory     Past Surgical History:  Procedure Laterality Date  . ANTERIOR CRUCIATE LIGAMENT REPAIR Right   . bone spur Left   . MENISCUS REPAIR Right     Allergies  Allergen Reactions  . Azithromycin Swelling    Prior to Admission medications   Medication Sig Start Date End Date Taking? Authorizing Provider  aspirin 81 MG chewable tablet Chew by mouth daily.    [provider]  Prenatal Vit-Fe Fumarate-FA (PRENATAL MULTIVITAMIN) TABS tablet Take 1 tablet by mouth daily at 12 noon.    [provider]  ranitidine (ZANTAC) 150 MG tablet Take 150 mg by mouth 2 (two) times daily.    [provider]      Social History: She  reports that she has never smoked. She has never used smokeless tobacco. She reports that she does not drink alcohol or use drugs.  Family History: family history is not on file.  no history of gyn cancers  Review of Systems: A full review of systems was performed and negative except as noted in the HPI.     O:  BP 120/70   Pulse 88   LMP 01/30/2017  Results for orders placed or performed during the hospital encounter of 10/25/17 (from the past 48 hour(s))  Protein / creatinine ratio, urine   Collection Time: 10/25/17  1:53 PM  Result Value Ref Range   Creatinine, Urine 62 mg/dL   Total Protein, Urine 14 mg/dL   Protein Creatinine Ratio 0.23 (H) 0.00 - 0.15 mg/mg[Cre]  CBC   Collection Time: 10/25/17  2:10 PM  Result Value Ref Range   WBC 13.2 (H) 3.6 - 11.0 K/uL   RBC 4.27 3.80 - 5.20 MIL/uL   Hemoglobin 12.1 12.0 - 16.0 g/dL   HCT 16.1 09.6 - 04.5 %   MCV 85.8 80.0 - 100.0 fL   MCH 28.4 26.0 - 34.0 pg   MCHC 33.1 32.0 - 36.0 g/dL   RDW 40.9 (H) 81.1 - 91.4 %   Platelets 145 (L) 150 - 440 K/uL  Comprehensive metabolic panel   Collection Time: 10/25/17  2:10 PM  Result Value Ref Range   Sodium 136 135 - 145 mmol/L   Potassium 3.9 3.5 - 5.1 mmol/L   Chloride 107 101 - 111 mmol/L   CO2 22 22 - 32 mmol/L   Glucose, Bld 69 65 - 99 mg/dL   BUN 13 6 - 20 mg/dL   Creatinine, Ser 7.82 0.44 - 1.00 mg/dL   Calcium 8.8 (L) 8.9 - 10.3 mg/dL   Total Protein 6.5 6.5 - 8.1 g/dL   Albumin 2.9 (L) 3.5 - 5.0 g/dL   AST 26 15 - 41 U/L   ALT 16 14 - 54 U/L   Alkaline Phosphatase 105 38 - 126 U/L   Total  Bilirubin 0.4 0.3 - 1.2 mg/dL   GFR calc non Af Amer >60 >60 mL/min   GFR calc Af Amer >60 >60 mL/min   Anion gap 7 5 - 15     Constitutional: NAD, AAOx3  HE/ENT: extraocular movements grossly intact, moist mucous membranes CV: RRR PULM: nl respiratory effort, CTABL     Abd: gravid, non-tender, non-distended, soft      Ext: Non-tender, Nonedematous   Psych: mood appropriate, speech normal Pelvic: deferred  Fetal  monitoring: Cat I  Appropriate for GA Baseline: 125bpm Variability: moderate Accelerations: present x >2 Decelerations absent Time 20mins    A/P: 32 y.o. 6865w2d here for antenatal surveillance for GHTN  Normotensive during triage visit, denies s/sx Pre-e. P/C ratio increasing, needs to complete 24hr urine protein.   Platelets stable: 137 on 4/18; 150 on 4/22; 145 on 4/25  discussed with Dr Feliberto GottronSchermerhorn, Pt to complete 24h urine protein, return in 48hrs for labs, NST and serial BPs on Saturday.    Fetal Wellbeing:  Reassuring Cat 1 tracing.  Reactive NST   D/c home stable, labor and pre-e precautions reviewed, follow-up as scheduled.    [name] A, CNM 10/25/2017  3:19 PM

## 2017-10-27 ENCOUNTER — Other Ambulatory Visit: Payer: Self-pay

## 2017-10-27 ENCOUNTER — Observation Stay
Admission: EM | Admit: 2017-10-27 | Discharge: 2017-10-27 | Disposition: A | Discharge status: Home / Self Care | Payer: BLUE CROSS/BLUE SHIELD | Attending: Certified Nurse Midwife | Admitting: Certified Nurse Midwife

## 2017-10-27 DIAGNOSIS — Z3A38 38 weeks gestation of pregnancy: Secondary | ICD-10-CM | POA: Diagnosis not present

## 2017-10-27 DIAGNOSIS — Z8759 Personal history of other complications of pregnancy, childbirth and the puerperium: Secondary | ICD-10-CM | POA: Diagnosis present

## 2017-10-27 DIAGNOSIS — O133 Gestational [pregnancy-induced] hypertension without significant proteinuria, third trimester: Secondary | ICD-10-CM | POA: Diagnosis not present

## 2017-10-27 DIAGNOSIS — O99210 Obesity complicating pregnancy, unspecified trimester: Secondary | ICD-10-CM

## 2017-10-27 DIAGNOSIS — O139 Gestational [pregnancy-induced] hypertension without significant proteinuria, unspecified trimester: Secondary | ICD-10-CM | POA: Diagnosis present

## 2017-10-27 DIAGNOSIS — R7989 Other specified abnormal findings of blood chemistry: Secondary | ICD-10-CM

## 2017-10-27 DIAGNOSIS — E229 Hyperfunction of pituitary gland, unspecified: Secondary | ICD-10-CM

## 2017-10-27 HISTORY — DX: Gestational (pregnancy-induced) hypertension without significant proteinuria, unspecified trimester: O13.9

## 2017-10-27 LAB — COMPREHENSIVE METABOLIC PANEL
ALK PHOS: 100 U/L (ref 38–126)
ALT: 18 U/L (ref 14–54)
AST: 27 U/L (ref 15–41)
Albumin: 2.7 g/dL — ABNORMAL LOW (ref 3.5–5.0)
Anion gap: 6 (ref 5–15)
BILIRUBIN TOTAL: 0.3 mg/dL (ref 0.3–1.2)
BUN: 9 mg/dL (ref 6–20)
CALCIUM: 8.9 mg/dL (ref 8.9–10.3)
CO2: 24 mmol/L (ref 22–32)
CREATININE: 0.72 mg/dL (ref 0.44–1.00)
Chloride: 105 mmol/L (ref 101–111)
GFR calc Af Amer: >60 mL/min (ref >60)
Glucose, Bld: 93 mg/dL (ref 65–99)
Potassium: 4.2 mmol/L (ref 3.5–5.1)
Sodium: 135 mmol/L (ref 135–145)
TOTAL PROTEIN: 6.2 g/dL — AB (ref 6.5–8.1)

## 2017-10-27 LAB — PROTEIN / CREATININE RATIO, URINE
CREATININE, URINE: 146 mg/dL
Protein Creatinine Ratio: 0.21 mg/mg{Cre} — ABNORMAL HIGH (ref 0.00–0.15)
TOTAL PROTEIN, URINE: 30 mg/dL

## 2017-10-27 LAB — PROTEIN, URINE, 24 HOUR
Collection Interval-UPROT: 24 hours
Protein, 24H Urine: 270 mg/d — ABNORMAL HIGH (ref 50–100)
Protein, Urine: 30 mg/dL
URINE TOTAL VOLUME-UPROT: 900 mL

## 2017-10-27 LAB — CBC
HCT: 36.9 % (ref 35.0–47.0)
Hemoglobin: 12.3 g/dL (ref 12.0–16.0)
MCH: 28.6 pg (ref 26.0–34.0)
MCHC: 33.4 g/dL (ref 32.0–36.0)
MCV: 85.7 fL (ref 80.0–100.0)
PLATELETS: 150 10*3/uL (ref 150–440)
RBC: 4.31 MIL/uL (ref 3.80–5.20)
RDW: 15.5 % — ABNORMAL HIGH (ref 11.5–14.5)
WBC: 12.7 10*3/uL — AB (ref 3.6–11.0)

## 2017-10-27 NOTE — OB Triage Note (Signed)
Pt presents for NST.  Pt reports no pain, ctx, or LOF.

## 2017-10-27 NOTE — Discharge Summary (Signed)
Triage visit for NST   Danielle Sanchez is a 32 y.o. G1P0. She is at [redacted]w[redacted]d gestation. She presents for a scheduled NST and labwork.  Indication: Gestational hypertension  S: Resting comfortably. No contractions/cramping, vaginal bleeding, or leakage of fluid. Active fetal movement. No headaches, vision changes, nausea or vomiting, shortness of breath, chest pain, or epigastric or RUQ pain. No concerns.   O: BPs: 0901 139/80          1001 116/70          1031 119/76          1101 130/93          1131 148/88           1201 143/85  BP 119/76   Pulse 70   Temp 98.3 F (36.8 C) (Oral)   Resp 20   LMP 01/30/2017  Results for orders placed or performed during the hospital encounter of 10/27/17 (from the past 48 hour(s))  Protein / creatinine ratio, urine   Collection Time: 10/27/17 10:19 AM  Result Value Ref Range   Creatinine, Urine 146 mg/dL   Total Protein, Urine 30 mg/dL   Protein Creatinine Ratio 0.21 (H) 0.00 - 0.15 mg/mg[Cre]  Comprehensive metabolic panel   Collection Time: 10/27/17 10:23 AM  Result Value Ref Range   Sodium 135 135 - 145 mmol/L   Potassium 4.2 3.5 - 5.1 mmol/L   Chloride 105 101 - 111 mmol/L   CO2 24 22 - 32 mmol/L   Glucose, Bld 93 65 - 99 mg/dL   BUN 9 6 - 20 mg/dL   Creatinine, Ser 1.61 0.44 - 1.00 mg/dL   Calcium 8.9 8.9 - 09.6 mg/dL   Total Protein 6.2 (L) 6.5 - 8.1 g/dL   Albumin 2.7 (L) 3.5 - 5.0 g/dL   AST 27 15 - 41 U/L   ALT 18 14 - 54 U/L   Alkaline Phosphatase 100 38 - 126 U/L   Total Bilirubin 0.3 0.3 - 1.2 mg/dL   GFR calc non Af Amer >60 >60 mL/min   GFR calc Af Amer >60 >60 mL/min   Anion gap 6 5 - 15  CBC on admission   Collection Time: 10/27/17 10:23 AM  Result Value Ref Range   WBC 12.7 (H) 3.6 - 11.0 K/uL   RBC 4.31 3.80 - 5.20 MIL/uL   Hemoglobin 12.3 12.0 - 16.0 g/dL   HCT 04.5 40.9 - 81.1 %   MCV 85.7 80.0 - 100.0 fL   MCH 28.6 26.0 - 34.0 pg   MCHC 33.4 32.0 - 36.0 g/dL   RDW 91.4 (H) 78.2 - 95.6 %   Platelets 150 150 -  440 K/uL  Results for orders placed or performed during the hospital encounter of 10/25/17 (from the past 48 hour(s))  Protein / creatinine ratio, urine   Collection Time: 10/25/17  1:53 PM  Result Value Ref Range   Creatinine, Urine 62 mg/dL   Total Protein, Urine 14 mg/dL   Protein Creatinine Ratio 0.23 (H) 0.00 - 0.15 mg/mg[Cre]  CBC   Collection Time: 10/25/17  2:10 PM  Result Value Ref Range   WBC 13.2 (H) 3.6 - 11.0 K/uL   RBC 4.27 3.80 - 5.20 MIL/uL   Hemoglobin 12.1 12.0 - 16.0 g/dL   HCT 21.3 08.6 - 57.8 %   MCV 85.8 80.0 - 100.0 fL   MCH 28.4 26.0 - 34.0 pg   MCHC 33.1 32.0 - 36.0 g/dL   RDW 46.9 (H)  11.5 - 14.5 %   Platelets 145 (L) 150 - 440 K/uL  Comprehensive metabolic panel   Collection Time: 10/25/17  2:10 PM  Result Value Ref Range   Sodium 136 135 - 145 mmol/L   Potassium 3.9 3.5 - 5.1 mmol/L   Chloride 107 101 - 111 mmol/L   CO2 22 22 - 32 mmol/L   Glucose, Bld 69 65 - 99 mg/dL   BUN 13 6 - 20 mg/dL   Creatinine, Ser 5.40 0.44 - 1.00 mg/dL   Calcium 8.8 (L) 8.9 - 10.3 mg/dL   Total Protein 6.5 6.5 - 8.1 g/dL   Albumin 2.9 (L) 3.5 - 5.0 g/dL   AST 26 15 - 41 U/L   ALT 16 14 - 54 U/L   Alkaline Phosphatase 105 38 - 126 U/L   Total Bilirubin 0.4 0.3 - 1.2 mg/dL   GFR calc non Af Amer >60 >60 mL/min   GFR calc Af Amer >60 >60 mL/min   Anion gap 7 5 - 15     Gen: NAD, AAOx3      Abd: FNTTP      Ext: Non-tender, +1 B/L LE edema  Pelvic: deferred   NST Baseline: 140bpm Variability: moderate Accelerations: present x >2 Decelerations: absent Toco: contractions q6-10 minutes, patient reports not feeling any Time  Interpretation: reactive NST, category 1 tracing  A/P:  32 y.o. G1P0 [redacted]w[redacted]d with gestational hypertension.    Fetal Wellbeing: Reassuring with reactive NST  GHTN: CBC, CMP, and P/C ratio WNL; 24 hour urine protein collected and pending.  D/c home stable, precautions reviewed, follow-up as scheduled.   Discussed with Dr. Jennell Corner, who is in agreement with this plan.   Danielle Sanchez 10/27/2017 12:21 PM  ----- Danielle Sanchez, CNM, WHNP-BC [facility], Department of OB/GYN [facility]

## 2017-10-30 ENCOUNTER — Inpatient Hospital Stay
Admission: EM | Admit: 2017-10-30 | Discharge: 2017-11-04 | DRG: 787 | Disposition: A | Discharge status: Home / Self Care | Payer: BLUE CROSS/BLUE SHIELD | Attending: Obstetrics & Gynecology | Admitting: Obstetrics & Gynecology

## 2017-10-30 ENCOUNTER — Other Ambulatory Visit: Payer: Self-pay

## 2017-10-30 DIAGNOSIS — O26893 Other specified pregnancy related conditions, third trimester: Secondary | ICD-10-CM | POA: Diagnosis not present

## 2017-10-30 DIAGNOSIS — R7989 Other specified abnormal findings of blood chemistry: Secondary | ICD-10-CM

## 2017-10-30 DIAGNOSIS — Z6791 Unspecified blood type, Rh negative: Secondary | ICD-10-CM

## 2017-10-30 DIAGNOSIS — R1084 Generalized abdominal pain: Secondary | ICD-10-CM | POA: Diagnosis not present

## 2017-10-30 DIAGNOSIS — O9081 Anemia of the puerperium: Secondary | ICD-10-CM | POA: Diagnosis not present

## 2017-10-30 DIAGNOSIS — O133 Gestational [pregnancy-induced] hypertension without significant proteinuria, third trimester: Secondary | ICD-10-CM | POA: Diagnosis not present

## 2017-10-30 DIAGNOSIS — O99214 Obesity complicating childbirth: Secondary | ICD-10-CM | POA: Diagnosis present

## 2017-10-30 DIAGNOSIS — D62 Acute posthemorrhagic anemia: Secondary | ICD-10-CM | POA: Diagnosis not present

## 2017-10-30 DIAGNOSIS — O99210 Obesity complicating pregnancy, unspecified trimester: Secondary | ICD-10-CM

## 2017-10-30 DIAGNOSIS — O134 Gestational [pregnancy-induced] hypertension without significant proteinuria, complicating childbirth: Principal | ICD-10-CM | POA: Diagnosis present

## 2017-10-30 DIAGNOSIS — I10 Essential (primary) hypertension: Secondary | ICD-10-CM | POA: Diagnosis not present

## 2017-10-30 DIAGNOSIS — E229 Hyperfunction of pituitary gland, unspecified: Secondary | ICD-10-CM

## 2017-10-30 DIAGNOSIS — Z3A39 39 weeks gestation of pregnancy: Secondary | ICD-10-CM | POA: Diagnosis not present

## 2017-10-30 DIAGNOSIS — G8918 Other acute postprocedural pain: Secondary | ICD-10-CM | POA: Diagnosis not present

## 2017-10-30 LAB — COMPREHENSIVE METABOLIC PANEL
ALT: 17 U/L (ref 14–54)
ANION GAP: 6 (ref 5–15)
AST: 29 U/L (ref 15–41)
Albumin: 3 g/dL — ABNORMAL LOW (ref 3.5–5.0)
Alkaline Phosphatase: 117 U/L (ref 38–126)
BUN: 11 mg/dL (ref 6–20)
CHLORIDE: 107 mmol/L (ref 101–111)
CO2: 24 mmol/L (ref 22–32)
CREATININE: 0.74 mg/dL (ref 0.44–1.00)
Calcium: 9.4 mg/dL (ref 8.9–10.3)
Glucose, Bld: 105 mg/dL — ABNORMAL HIGH (ref 65–99)
POTASSIUM: 3.9 mmol/L (ref 3.5–5.1)
SODIUM: 137 mmol/L (ref 135–145)
Total Bilirubin: 0.1 mg/dL — ABNORMAL LOW (ref 0.3–1.2)
Total Protein: 6.6 g/dL (ref 6.5–8.1)

## 2017-10-30 LAB — CBC
HCT: 36.5 % (ref 35.0–47.0)
Hemoglobin: 12.1 g/dL (ref 12.0–16.0)
MCH: 28.5 pg (ref 26.0–34.0)
MCHC: 33.1 g/dL (ref 32.0–36.0)
MCV: 86.1 fL (ref 80.0–100.0)
PLATELETS: 141 10*3/uL — AB (ref 150–440)
RBC: 4.24 MIL/uL (ref 3.80–5.20)
RDW: 15.2 % — AB (ref 11.5–14.5)
WBC: 13.6 10*3/uL — AB (ref 3.6–11.0)

## 2017-10-30 LAB — PROTEIN / CREATININE RATIO, URINE
Creatinine, Urine: 110 mg/dL
PROTEIN CREATININE RATIO: 0.29 mg/mg{creat} — AB (ref 0.00–0.15)
TOTAL PROTEIN, URINE: 32 mg/dL

## 2017-10-30 LAB — TYPE AND SCREEN
ABO/RH(D): O NEG
ANTIBODY SCREEN: NEGATIVE

## 2017-10-30 LAB — RAPID HIV SCREEN (HIV 1/2 AB+AG)
HIV 1/2 ANTIBODIES: NONREACTIVE
HIV-1 P24 Antigen - HIV24: NONREACTIVE

## 2017-10-30 LAB — LACTATE DEHYDROGENASE: LDH: 139 U/L (ref 98–192)

## 2017-10-30 MED ORDER — OXYTOCIN 10 UNIT/ML IJ SOLN
INTRAMUSCULAR | Status: AC
  Filled 2017-10-30: qty 2

## 2017-10-30 MED ORDER — SODIUM CHLORIDE 0.9 % IV SOLN
2.0000 g | Freq: Once | INTRAVENOUS | Status: AC
Start: 2017-10-30 — End: 2017-10-31
  Administered 2017-10-31: 2 g via INTRAVENOUS
  Filled 2017-10-30: qty 2000

## 2017-10-30 MED ORDER — SODIUM CHLORIDE 0.9 % IV SOLN: 2.0000 g | INTRAVENOUS | Status: DC

## 2017-10-30 MED ORDER — LIDOCAINE HCL (PF) 1 % IJ SOLN: 30.0000 mL | INTRAMUSCULAR | Status: DC | PRN

## 2017-10-30 MED ORDER — LACTATED RINGERS IV SOLN
INTRAVENOUS | Status: DC
  Administered 2017-10-30 – 2017-11-01 (×4): via INTRAVENOUS

## 2017-10-30 MED ORDER — SOD CITRATE-CITRIC ACID 500-334 MG/5ML PO SOLN
30.0000 mL | ORAL | Status: DC | PRN
  Administered 2017-11-01: 30 mL via ORAL
  Filled 2017-10-30: qty 15

## 2017-10-30 MED ORDER — MISOPROSTOL 25 MCG QUARTER TABLET
25.0000 ug | ORAL_TABLET | ORAL | Status: DC
  Administered 2017-10-30 – 2017-11-02 (×5): 25 ug via ORAL
  Filled 2017-10-30 (×5): qty 1

## 2017-10-30 MED ORDER — AMMONIA AROMATIC IN INHA
RESPIRATORY_TRACT | Status: AC
  Filled 2017-10-30: qty 10

## 2017-10-30 MED ORDER — HYDRALAZINE HCL 20 MG/ML IJ SOLN: 10.0000 mg | Freq: Once | INTRAMUSCULAR | Status: DC | PRN

## 2017-10-30 MED ORDER — BUTORPHANOL TARTRATE 2 MG/ML IJ SOLN: 1.0000 mg | INTRAMUSCULAR | Status: DC | PRN

## 2017-10-30 MED ORDER — OXYTOCIN 40 UNITS IN LACTATED RINGERS INFUSION - SIMPLE MED
2.5000 [IU]/h | INTRAVENOUS | Status: DC
  Filled 2017-10-30: qty 1000

## 2017-10-30 MED ORDER — LIDOCAINE HCL (PF) 1 % IJ SOLN
INTRAMUSCULAR | Status: AC
  Filled 2017-10-30: qty 30

## 2017-10-30 MED ORDER — OXYTOCIN BOLUS FROM INFUSION: 500.0000 mL | Freq: Once | INTRAVENOUS | Status: DC

## 2017-10-30 MED ORDER — ONDANSETRON HCL 4 MG/2ML IJ SOLN: 4.0000 mg | Freq: Four times a day (QID) | INTRAMUSCULAR | Status: DC | PRN

## 2017-10-30 MED ORDER — SODIUM CHLORIDE 0.9 % IV SOLN
1.0000 g | INTRAVENOUS | Status: DC
  Administered 2017-10-31 – 2017-11-01 (×7): 1 g via INTRAVENOUS
  Filled 2017-10-30 (×10): qty 1000

## 2017-10-30 MED ORDER — MISOPROSTOL 25 MCG QUARTER TABLET
ORAL_TABLET | ORAL | Status: AC
  Administered 2017-10-30: 25 ug via ORAL
  Filled 2017-10-30: qty 1

## 2017-10-30 MED ORDER — MISOPROSTOL 200 MCG PO TABS
ORAL_TABLET | ORAL | Status: AC
  Administered 2017-10-31: 25 ug via ORAL
  Filled 2017-10-30: qty 4

## 2017-10-30 MED ORDER — LABETALOL HCL 5 MG/ML IV SOLN
20.0000 mg | INTRAVENOUS | Status: DC | PRN
  Administered 2017-11-01: 20 mg via INTRAVENOUS
  Administered 2017-11-01: 40 mg via INTRAVENOUS
  Filled 2017-10-30: qty 16
  Filled 2017-10-30: qty 4
  Filled 2017-10-30 (×2): qty 16

## 2017-10-30 MED ORDER — LACTATED RINGERS IV SOLN
500.0000 mL | INTRAVENOUS | Status: DC | PRN
  Administered 2017-11-01: 1000 mL via INTRAVENOUS

## 2017-10-30 MED ORDER — ACETAMINOPHEN 325 MG PO TABS
650.0000 mg | ORAL_TABLET | ORAL | Status: DC | PRN
  Administered 2017-10-31 – 2017-11-01 (×3): 650 mg via ORAL
  Filled 2017-10-30 (×3): qty 2

## 2017-10-30 NOTE — H&P (Signed)
OB ADMISSION/ HISTORY & PHYSICAL:  Admission Date: 10/30/2017  8:14 PM  Admit Diagnosis: planned iol for gHTN  Danielle Sanchez is a 32 y.o. female G1P0 at 39+0wks  Prenatal History: G1P0   EDC : 11/06/2017, by Last Menstrual Period  Prenatal care at [facility] Prenatal course complicated by  1. gTHN 2. Obesity: s/p anesthesia consult x2, last at 36wks cleared by Dr. Pernell Dupre 3. Varicella non immune 4. Rh neg    Medical / Surgical History :  Past medical history:  Past Medical History:  Diagnosis Date  . Medical history non-contributory   . Pregnancy induced hypertension      Past surgical history:  Past Surgical History:  Procedure Laterality Date  . ANTERIOR CRUCIATE LIGAMENT REPAIR Right   . bone spur Left   . MENISCUS REPAIR Right   . WISDOM TOOTH EXTRACTION  2017    Family History:  Family History  Problem Relation Age of Onset  . Diabetes Maternal Grandmother   . Hypertension Maternal Grandmother      Social History:  reports that she has never smoked. She has never used smokeless tobacco. She reports that she does not drink alcohol or use drugs.   Allergies: Azithromycin    Current Medications at time of admission:  Prior to Admission medications   Medication Sig Start Date End Date Taking? Authorizing Provider  aspirin 81 MG chewable tablet Chew by mouth daily.   Yes [provider]  Prenatal Vit-Fe Fumarate-FA (PRENATAL MULTIVITAMIN) TABS tablet Take 1 tablet by mouth daily at 12 noon.   Yes [provider]  ranitidine (ZANTAC) 150 MG tablet Take 150 mg by mouth 2 (two) times daily.   Yes [provider]     Review of Systems: No headache, no visual changes, no SOB.  Physical Exam:  VS: Blood pressure (!) 156/93, pulse 92, height  (1.626 m), weight (!) 142.4 kg (314 lb), last menstrual period 01/30/2017.  General: alert and oriented, appears NAD Heart: RRR Lungs: Clear lung fields Abdomen: Gravid, soft and non-tender,  non-distended / uterus: non-tender Extremities: 2+ pitting edema Reflexes: +1 pedal No clonus.  FHT: 130, moderate variability, +accels, no decels TOCO: none SVE:  Dilation: 1 1/40/ballotable   Cephalic by leopolds  Prenatal Labs: Blood type/Rh  O neg  Antibody screen neg  Rubella Immune  Varicella Immune  RPR NR  HBsAg Neg  HIV NR  GC neg  Chlamydia neg  Genetic screening negative  1 hour GTT 168  3 hour GTT 136/139/179  GBS negative   No results found.  Assessment: 39+[redacted] weeks gestation FHR category 2   Plan:  Admit for induction of labor Labs pending Epidural when desired Continuous fetal monitoring   1. Fetal Well being  - Fetal Tracing: cat i - Ultrasound:  reviewed, as above - Group B Streptococcus: neg - Presentation: vtx confirmed by Leopolds   2. Routine OB: - Prenatal labs reviewed, as above - Rh neg  3. Induction of Labor:  -  Contractions external toco in place -  Plan for induction with cytotec  4. Post Partum Planning: - Infant feeding: breast - Contraception: Nexplanon vs pop

## 2017-10-31 MED ORDER — OXYTOCIN 40 UNITS IN LACTATED RINGERS INFUSION - SIMPLE MED
1.0000 m[IU]/min | INTRAVENOUS | Status: DC
  Administered 2017-10-31 – 2017-11-01 (×2): 2 m[IU]/min via INTRAVENOUS
  Administered 2017-11-01: 800 mL via INTRAVENOUS
  Administered 2017-11-01: 11 m[IU]/min via INTRAVENOUS
  Administered 2017-11-01: 8 m[IU]/min via INTRAVENOUS
  Administered 2017-11-02: 200 mL via INTRAVENOUS
  Filled 2017-10-31 (×2): qty 1000

## 2017-10-31 MED ORDER — TERBUTALINE SULFATE 1 MG/ML IJ SOLN: 0.2500 mg | Freq: Once | INTRAMUSCULAR | Status: DC | PRN

## 2017-10-31 NOTE — Progress Notes (Signed)
Danielle Sanchez is a 32 y.o. G1P0 at [redacted]w[redacted]d Subjective: Mild CTX  SROM Isolated elevated BP , repeat not in severe range   Objective: BP (!) 141/82   Pulse 83   Temp 98.3 F (36.8 C) (Oral)   Resp 20   Ht  (1.626 m)   Wt (!) 142.4 kg (314 lb)   LMP 01/30/2017   BMI 53.90 kg/m  I/O last 3 completed shifts: In: 1010.4 [P.O.:250; I.V.:760.4] Out: -  Total I/O In: 1125 [I.V.:1125] Out: -   FHT:  Reassuring fetal monitoring SVE:  CTX irregular 1-3 min  Dilation: 1.5 Effacement (%): 70 Station: -3 Exam by:: Arta Silence, RN  Labs: Lab Results  Component Value Date   WBC 13.6 (H) 10/30/2017   HGB 12.1 10/30/2017   HCT 36.5 10/30/2017   MCV 86.1 10/30/2017   PLT 141 (L) 10/30/2017    Assessment / Plan: srom  Add pitocin , possible iupc   Danielle Sanchez 10/31/2017, 2:01 PM

## 2017-10-31 NOTE — Progress Notes (Signed)
Patient ID: Danielle Sanchez, female   DOB: 29-Nov-1985, 32 y.o.   MRN: [id number] Pitocin at 90mu/min   BP ok  Cx 1cm / 70 OOP  IUPC placed given ctx not being picked up   Reassuring fetal monitoring  Cont to increase pitocin to gain adequate ctx pattern

## 2017-10-31 NOTE — Progress Notes (Signed)
Lauran Romanski is a 32 y.o. G1P0 at [redacted]w[redacted]d Induction from last pm for gestational hypertension  Undergoing cytotec   Objective: BP (!) 147/87   Pulse 77   Temp 97.9 F (36.6 C) (Oral)   Resp 20   Ht  (1.626 m)   Wt (!) 142.4 kg (314 lb)   LMP 01/30/2017   BMI 53.90 kg/m  I/O last 3 completed shifts: In: 1010.4 [P.O.:250; I.V.:760.4] Out: -  No intake/output data recorded.  FHT:  FHR: 150 bpm, variability: minimal ,  accelerations:  Present,  decelerations:  Absent UC:   irregular, every 2-5 minutes SVE:   Dilation: 1 Effacement (%): 70 Station: -3 Exam by:: TJS, MD  Labs: Lab Results  Component Value Date   WBC 13.6 (H) 10/30/2017   HGB 12.1 10/30/2017   HCT 36.5 10/30/2017   MCV 86.1 10/30/2017   PLT 141 (L) 10/30/2017    Assessment / Plan: Gestational HTN . No sign / symptoms of preeclampsia  Cont cytotec induction  Ihor Austin Nishat Livingston 10/31/2017, 10:47 AM

## 2017-11-01 ENCOUNTER — Inpatient Hospital Stay: Payer: BLUE CROSS/BLUE SHIELD | Admitting: Anesthesiology

## 2017-11-01 ENCOUNTER — Encounter
Admission: EM | Disposition: A | Discharge status: Home / Self Care | Payer: Self-pay | Source: Home / Self Care | Attending: Obstetrics & Gynecology

## 2017-11-01 ENCOUNTER — Encounter: Payer: Self-pay | Admitting: Anesthesiology

## 2017-11-01 LAB — RPR: RPR Ser Ql: NONREACTIVE

## 2017-11-01 LAB — CBC
HCT: 39.7 % (ref 35.0–47.0)
Hemoglobin: 13.2 g/dL (ref 12.0–16.0)
MCH: 28.4 pg (ref 26.0–34.0)
MCHC: 33.4 g/dL (ref 32.0–36.0)
MCV: 85 fL (ref 80.0–100.0)
PLATELETS: 151 10*3/uL (ref 150–440)
RBC: 4.67 MIL/uL (ref 3.80–5.20)
RDW: 15.4 % — AB (ref 11.5–14.5)
WBC: 17.2 10*3/uL — ABNORMAL HIGH (ref 3.6–11.0)

## 2017-11-01 SURGERY — Surgical Case
Anesthesia: Epidural

## 2017-11-01 MED ORDER — LABETALOL HCL 5 MG/ML IV SOLN
20.0000 mg | INTRAVENOUS | Status: DC | PRN
  Filled 2017-11-01: qty 16

## 2017-11-01 MED ORDER — FENTANYL CITRATE (PF) 100 MCG/2ML IJ SOLN
INTRAMUSCULAR | Status: DC | PRN
  Administered 2017-11-01: 100 ug via EPIDURAL

## 2017-11-01 MED ORDER — LIDOCAINE HCL (PF) 1 % IJ SOLN
INTRAMUSCULAR | Status: DC | PRN
  Administered 2017-11-01: 3 mL via SUBCUTANEOUS

## 2017-11-01 MED ORDER — DEXAMETHASONE SODIUM PHOSPHATE 4 MG/ML IJ SOLN
INTRAMUSCULAR | Status: DC | PRN
  Administered 2017-11-01: 10 mg via INTRAVENOUS

## 2017-11-01 MED ORDER — EPHEDRINE 5 MG/ML INJ: 10.0000 mg | INTRAVENOUS | Status: DC | PRN

## 2017-11-01 MED ORDER — CARBOPROST TROMETHAMINE 250 MCG/ML IM SOLN
INTRAMUSCULAR | Status: AC
  Filled 2017-11-01: qty 1

## 2017-11-01 MED ORDER — MORPHINE SULFATE (PF) 0.5 MG/ML IJ SOLN
INTRAMUSCULAR | Status: AC
  Filled 2017-11-01: qty 10

## 2017-11-01 MED ORDER — FENTANYL 2.5 MCG/ML W/ROPIVACAINE 0.15% IN NS 100 ML EPIDURAL (ARMC)
EPIDURAL | Status: DC | PRN
  Administered 2017-11-01: 12 mL/h via EPIDURAL

## 2017-11-01 MED ORDER — MORPHINE SULFATE (PF) 0.5 MG/ML IJ SOLN
INTRAMUSCULAR | Status: DC | PRN
  Administered 2017-11-01 – 2017-11-02 (×2): 2 mg via EPIDURAL

## 2017-11-01 MED ORDER — MIDAZOLAM HCL 2 MG/2ML IJ SOLN
INTRAMUSCULAR | Status: AC
  Filled 2017-11-01: qty 2

## 2017-11-01 MED ORDER — BUPIVACAINE HCL (PF) 0.5 % IJ SOLN
INTRAMUSCULAR | Status: AC
  Filled 2017-11-01: qty 30

## 2017-11-01 MED ORDER — DIPHENHYDRAMINE HCL 50 MG/ML IJ SOLN: 12.5000 mg | INTRAMUSCULAR | Status: DC | PRN

## 2017-11-01 MED ORDER — LACTATED RINGERS IV SOLN: 500.0000 mL | Freq: Once | INTRAVENOUS | Status: DC

## 2017-11-01 MED ORDER — BUPIVACAINE HCL (PF) 0.25 % IJ SOLN
INTRAMUSCULAR | Status: DC | PRN
  Administered 2017-11-01 (×2): 4 mL via EPIDURAL

## 2017-11-01 MED ORDER — KETAMINE HCL 50 MG/ML IJ SOLN
INTRAMUSCULAR | Status: AC
  Filled 2017-11-01: qty 10

## 2017-11-01 MED ORDER — PHENYLEPHRINE 40 MCG/ML (10ML) SYRINGE FOR IV PUSH (FOR BLOOD PRESSURE SUPPORT): 80.0000 ug | PREFILLED_SYRINGE | INTRAVENOUS | Status: DC | PRN

## 2017-11-01 MED ORDER — FENTANYL CITRATE (PF) 100 MCG/2ML IJ SOLN
INTRAMUSCULAR | Status: AC
  Filled 2017-11-01: qty 2

## 2017-11-01 MED ORDER — BUPIVACAINE HCL (PF) 0.5 % IJ SOLN: 30.0000 mL | Freq: Once | INTRAMUSCULAR | Status: DC

## 2017-11-01 MED ORDER — LIDOCAINE-EPINEPHRINE (PF) 1.5 %-1:200000 IJ SOLN
INTRAMUSCULAR | Status: DC | PRN
  Administered 2017-11-01: 3 mL via EPIDURAL

## 2017-11-01 MED ORDER — BUPIVACAINE LIPOSOME 1.3 % IJ SUSP: 20.0000 mL | Freq: Once | INTRAMUSCULAR | Status: DC

## 2017-11-01 MED ORDER — KETAMINE HCL 50 MG/ML IJ SOLN
INTRAMUSCULAR | Status: DC | PRN
  Administered 2017-11-01 – 2017-11-02 (×4): 25 mg via INTRAMUSCULAR

## 2017-11-01 MED ORDER — METHYLERGONOVINE MALEATE 0.2 MG/ML IJ SOLN
INTRAMUSCULAR | Status: AC
  Filled 2017-11-01: qty 1

## 2017-11-01 MED ORDER — FENTANYL 2.5 MCG/ML W/ROPIVACAINE 0.15% IN NS 100 ML EPIDURAL (ARMC)
EPIDURAL | Status: AC
  Filled 2017-11-01: qty 100

## 2017-11-01 MED ORDER — METHYLERGONOVINE MALEATE 0.2 MG/ML IJ SOLN
INTRAMUSCULAR | Status: DC | PRN
  Administered 2017-11-01: 0.2 mg via INTRAMUSCULAR

## 2017-11-01 MED ORDER — ACETAMINOPHEN 10 MG/ML IV SOLN
INTRAVENOUS | Status: AC
  Filled 2017-11-01: qty 100

## 2017-11-01 MED ORDER — FENTANYL 2.5 MCG/ML W/ROPIVACAINE 0.15% IN NS 100 ML EPIDURAL (ARMC)
12.0000 mL/h | EPIDURAL | Status: DC
  Administered 2017-11-01: 12 mL/h via EPIDURAL
  Filled 2017-11-01: qty 100

## 2017-11-01 MED ORDER — CEFAZOLIN SODIUM 10 G IJ SOLR
3.0000 g | INTRAMUSCULAR | Status: AC
  Administered 2017-11-01: 3 g via INTRAVENOUS
  Filled 2017-11-01 (×2): qty 3000

## 2017-11-01 MED ORDER — ONDANSETRON HCL 4 MG/2ML IJ SOLN
INTRAMUSCULAR | Status: DC | PRN
  Administered 2017-11-01: 4 mg via INTRAVENOUS

## 2017-11-01 MED ORDER — LIDOCAINE HCL (PF) 2 % IJ SOLN
INTRAMUSCULAR | Status: DC | PRN
  Administered 2017-11-01 (×3): 100 mg via EPIDURAL
  Administered 2017-11-01: 100 mg via INTRADERMAL
  Administered 2017-11-01 (×2): 100 mg via EPIDURAL

## 2017-11-01 MED ORDER — LIDOCAINE HCL (PF) 2 % IJ SOLN
INTRAMUSCULAR | Status: AC
  Filled 2017-11-01: qty 50

## 2017-11-01 SURGICAL SUPPLY — 37 items
CANISTER SUCT 3000ML PPV (MISCELLANEOUS) ×2 IMPLANT
DERMABOND ADVANCED (GAUZE/BANDAGES/DRESSINGS) ×1
DERMABOND ADVANCED .7 DNX12 (GAUZE/BANDAGES/DRESSINGS) ×1 IMPLANT
DRSG TELFA 3X8 NADH (GAUZE/BANDAGES/DRESSINGS) IMPLANT
ELECT CAUTERY BLADE 6.4 (BLADE) ×2 IMPLANT
ELECT REM PT RETURN 9FT ADLT (ELECTROSURGICAL) ×2
ELECTRODE REM PT RTRN 9FT ADLT (ELECTROSURGICAL) ×1 IMPLANT
GAUZE SPONGE 4X4 12PLY STRL (GAUZE/BANDAGES/DRESSINGS) IMPLANT
GLOVE PI ORTHOPRO 6.5 (GLOVE) ×3
GLOVE PI ORTHOPRO STRL 6.5 (GLOVE) ×3 IMPLANT
GLOVE SURG SYN 6.5 ES PF (GLOVE) ×6 IMPLANT
GOWN STRL REUS W/ TWL LRG LVL3 (GOWN DISPOSABLE) ×3 IMPLANT
GOWN STRL REUS W/TWL LRG LVL3 (GOWN DISPOSABLE) ×3
NS IRRIG 1000ML POUR BTL (IV SOLUTION) ×2 IMPLANT
PACK C SECTION AR (MISCELLANEOUS) ×2 IMPLANT
PAD ABD DERMACEA PRESS 5X9 (GAUZE/BANDAGES/DRESSINGS) ×4 IMPLANT
PAD OB MATERNITY 4.3X12.25 (PERSONAL CARE ITEMS) ×2 IMPLANT
PAD PREP 24X41 OB/GYN DISP (PERSONAL CARE ITEMS) ×2 IMPLANT
RETRACTOR TRAXI PANNICULUS (MISCELLANEOUS) ×1 IMPLANT
RTRCTR C-SECT PINK 34CM XLRG (MISCELLANEOUS) ×2 IMPLANT
SPONGE LAP 18X18 5 PK (GAUZE/BANDAGES/DRESSINGS) ×2 IMPLANT
SPONGE XRAY 4X4 16PLY STRL (MISCELLANEOUS) ×2 IMPLANT
STAPLER INSORB 30 2030 C-SECTI (MISCELLANEOUS) ×2 IMPLANT
STRAP SAFETY 5IN WIDE (MISCELLANEOUS) ×2 IMPLANT
STRIP CLOSURE SKIN 1/2X4 (GAUZE/BANDAGES/DRESSINGS) IMPLANT
SUT MNCRL 4-0 (SUTURE)
SUT MNCRL 4-0 27XMFL (SUTURE)
SUT PDS AB 1 TP1 96 (SUTURE) ×4 IMPLANT
SUT PLAIN 2 0 XLH (SUTURE) ×2 IMPLANT
SUT VIC AB 0 CT1 36 (SUTURE) ×4 IMPLANT
SUT VIC AB 2-0 CT1 27 (SUTURE) ×1
SUT VIC AB 2-0 CT1 TAPERPNT 27 (SUTURE) ×1 IMPLANT
SUT VIC AB 3-0 SH 27 (SUTURE) ×1
SUT VIC AB 3-0 SH 27X BRD (SUTURE) ×1 IMPLANT
SUTURE MNCRL 4-0 27XMF (SUTURE) IMPLANT
SWABSTK COMLB BENZOIN TINCTURE (MISCELLANEOUS) IMPLANT
TRAXI PANNICULUS RETRACTOR (MISCELLANEOUS) ×1

## 2017-11-01 NOTE — Anesthesia Preprocedure Evaluation (Addendum)
Anesthesia Evaluation  Patient identified by MRN, date of birth, ID band Patient awake    Reviewed: Allergy & Precautions, H&P , NPO status , Patient's Chart, lab work & pertinent test results  History of Anesthesia Complications (+) PONV and history of anesthetic complications  Airway Mallampati: III  TM Distance: <3 FB Neck ROM: full    Dental no notable dental hx. (+) Chipped, Poor Dentition   Pulmonary neg pulmonary ROS,           Cardiovascular Exercise Tolerance: Good hypertension, (-) angina(-) Past MI and (-) DOE      Neuro/Psych    GI/Hepatic negative GI ROS,   Endo/Other  Morbid obesity  Renal/GU   negative genitourinary   Musculoskeletal   Abdominal   Peds  Hematology negative hematology ROS (+)   Anesthesia Other Findings Past Medical History: No date: Medical history non-contributory No date: Pregnancy induced hypertension  Past Surgical History: No date: ANTERIOR CRUCIATE LIGAMENT REPAIR; Right No date: bone spur; Left No date: MENISCUS REPAIR; Right 2017: WISDOM TOOTH EXTRACTION  BMI    Body Mass Index:  53.90 kg/m      Reproductive/Obstetrics (+) Pregnancy                            Anesthesia Physical Anesthesia Plan  ASA: III  Anesthesia Plan: Epidural   Post-op Pain Management:    Induction:   PONV Risk Score and Plan:   Airway Management Planned:   Additional Equipment:   Intra-op Plan:   Post-operative Plan:   Informed Consent: I have reviewed the patients History and Physical, chart, labs and discussed the procedure including the risks, benefits and alternatives for the proposed anesthesia with the patient or authorized representative who has indicated his/her understanding and acceptance.     Plan Discussed with: Anesthesiologist  Anesthesia Plan Comments:         Anesthesia Quick Evaluation

## 2017-11-01 NOTE — Progress Notes (Signed)
Decision for cesarean  At 21:40 patient was rechecked with no change.   Pitocin at with q2-39min contractions, and 120-140MV units q . Fetal hear tracing: 140 min variability, no spontaneous accelerations but + with scalp stim.  No decelerations  Discussed with Patient, husband and doula at length the risks and benefits for decision of cesarean as well as the risks and benefits for continuing with attempts for vaginal delivery.  Questions were answered, to perceived satisfaction. The medical staff exited the room to leave the parents to discuss and upon this, they decided to proceed with cesarean. Team was notified/called.  Consents reviewed with patient and signed. Pt allergic to azithromycin so will not use, just ancef.  Anesthesia bolused epidural  Will proceed when ready.  ----- Ranae Plumber, MD Attending Obstetrician and Gynecologist [facility], Department of OB/GYN [facility]

## 2017-11-01 NOTE — Progress Notes (Signed)
Spoke with the provider about patient situation. MD instructed RN to give the patient a pitocin break, let her take a shower, and eat some breakfast and then restart induction. Patient removed from monitors, pitocin stopped, patient to order breakfast and take a shower. Instructed patient to hit her nurse bell when she was done showering to hook her back up. Will continue to monitor.

## 2017-11-01 NOTE — Anesthesia Procedure Notes (Signed)
Epidural Patient location during procedure: OB Start time: 11/01/2017 3:53 PM End time: 11/01/2017 4:22 PM  Staffing Anesthesiologist: Piscitello, Cleda Mccreedy, MD Resident/CRNA: Irving Burton, CRNA Performed: resident/CRNA   Preanesthetic Checklist Completed: patient identified, site marked, surgical consent, pre-op evaluation, IV checked, risks and benefits discussed and monitors and equipment checked  Epidural Patient position: sitting Prep: ChloraPrep Patient monitoring: heart rate, continuous pulse ox and blood pressure Approach: midline Location: L2-L3 Injection technique: LOR air  Needle:  Needle type: Tuohy  Needle gauge: 17 G Needle length: 9 cm Needle insertion depth: 7 cm Catheter type: closed end flexible Catheter size: 19 Gauge Catheter at skin depth: 12 cm Test dose: negative and 1.5% lidocaine with Epi 1:200 K  Assessment Events: blood not aspirated, injection not painful, no injection resistance, negative IV test and no paresthesia  Additional Notes Reason for block:procedure for pain

## 2017-11-02 LAB — CBC
HEMATOCRIT: 33.7 % — AB (ref 35.0–47.0)
Hemoglobin: 11.2 g/dL — ABNORMAL LOW (ref 12.0–16.0)
MCH: 28.6 pg (ref 26.0–34.0)
MCHC: 33.2 g/dL (ref 32.0–36.0)
MCV: 86.1 fL (ref 80.0–100.0)
PLATELETS: 154 10*3/uL (ref 150–440)
RBC: 3.91 MIL/uL (ref 3.80–5.20)
RDW: 15.5 % — AB (ref 11.5–14.5)
WBC: 26.8 10*3/uL — AB (ref 3.6–11.0)

## 2017-11-02 MED ORDER — OXYTOCIN 40 UNITS IN LACTATED RINGERS INFUSION - SIMPLE MED
INTRAVENOUS | Status: DC | PRN
  Administered 2017-11-02: 200 mL via INTRAVENOUS

## 2017-11-02 MED ORDER — NALBUPHINE HCL 10 MG/ML IJ SOLN: 5.0000 mg | Freq: Once | INTRAMUSCULAR | Status: DC | PRN

## 2017-11-02 MED ORDER — MENTHOL 3 MG MT LOZG
1.0000 | LOZENGE | OROMUCOSAL | Status: DC | PRN
  Filled 2017-11-02: qty 9

## 2017-11-02 MED ORDER — MEPERIDINE HCL 25 MG/ML IJ SOLN: 6.2500 mg | INTRAMUSCULAR | Status: DC | PRN

## 2017-11-02 MED ORDER — SODIUM CHLORIDE 0.9% FLUSH: 3.0000 mL | Freq: Two times a day (BID) | INTRAVENOUS | Status: DC

## 2017-11-02 MED ORDER — SODIUM CHLORIDE 0.9 % IV SOLN
INTRAVENOUS | Status: DC | PRN
  Administered 2017-11-02: 70 mL

## 2017-11-02 MED ORDER — COCONUT OIL OIL
1.0000 "application " | TOPICAL_OIL | Status: DC | PRN
  Administered 2017-11-02: 1 via TOPICAL
  Filled 2017-11-02: qty 120

## 2017-11-02 MED ORDER — NALBUPHINE HCL 10 MG/ML IJ SOLN: 5.0000 mg | INTRAMUSCULAR | Status: DC | PRN

## 2017-11-02 MED ORDER — OXYCODONE HCL 5 MG PO TABS
10.0000 mg | ORAL_TABLET | ORAL | Status: DC | PRN
  Administered 2017-11-03: 10 mg via ORAL
  Filled 2017-11-02 (×2): qty 2

## 2017-11-02 MED ORDER — BUPIVACAINE HCL (PF) 0.5 % IJ SOLN
INTRAMUSCULAR | Status: DC | PRN
  Administered 2017-11-02: 30 mL

## 2017-11-02 MED ORDER — SODIUM CHLORIDE 0.9% FLUSH: 3.0000 mL | INTRAVENOUS | Status: DC | PRN

## 2017-11-02 MED ORDER — ACETAMINOPHEN 325 MG PO TABS: 650.0000 mg | ORAL_TABLET | Freq: Four times a day (QID) | ORAL | Status: DC

## 2017-11-02 MED ORDER — OXYCODONE HCL 5 MG PO TABS: 5.0000 mg | ORAL_TABLET | ORAL | Status: AC | PRN

## 2017-11-02 MED ORDER — ACETAMINOPHEN 500 MG PO TABS
1000.0000 mg | ORAL_TABLET | Freq: Four times a day (QID) | ORAL | Status: DC
  Administered 2017-11-02 – 2017-11-04 (×9): 1000 mg via ORAL
  Filled 2017-11-02 (×10): qty 2

## 2017-11-02 MED ORDER — KETOROLAC TROMETHAMINE 30 MG/ML IJ SOLN: 30.0000 mg | Freq: Four times a day (QID) | INTRAMUSCULAR | Status: AC

## 2017-11-02 MED ORDER — ENOXAPARIN SODIUM 40 MG/0.4ML ~~LOC~~ SOLN
40.0000 mg | SUBCUTANEOUS | Status: DC
  Administered 2017-11-03 – 2017-11-04 (×2): 40 mg via SUBCUTANEOUS
  Filled 2017-11-02 (×2): qty 0.4

## 2017-11-02 MED ORDER — MIDAZOLAM HCL 2 MG/2ML IJ SOLN
INTRAMUSCULAR | Status: DC | PRN
  Administered 2017-11-02: 2 mg via INTRAVENOUS

## 2017-11-02 MED ORDER — KETOROLAC TROMETHAMINE 30 MG/ML IJ SOLN
30.0000 mg | Freq: Four times a day (QID) | INTRAMUSCULAR | Status: AC
  Administered 2017-11-02 (×2): 30 mg via INTRAVENOUS
  Filled 2017-11-02 (×2): qty 1

## 2017-11-02 MED ORDER — SODIUM CHLORIDE 0.9 % IV SOLN: 250.0000 mL | INTRAVENOUS | Status: DC

## 2017-11-02 MED ORDER — OXYTOCIN 40 UNITS IN LACTATED RINGERS INFUSION - SIMPLE MED
2.5000 [IU]/h | INTRAVENOUS | Status: AC
  Administered 2017-11-02: 2.5 [IU]/h via INTRAVENOUS

## 2017-11-02 MED ORDER — PRENATAL MULTIVITAMIN CH
1.0000 | ORAL_TABLET | Freq: Every day | ORAL | Status: DC
  Administered 2017-11-02 – 2017-11-04 (×3): 1 via ORAL
  Filled 2017-11-02 (×3): qty 1

## 2017-11-02 MED ORDER — IBUPROFEN 600 MG PO TABS
600.0000 mg | ORAL_TABLET | Freq: Four times a day (QID) | ORAL | Status: DC
  Administered 2017-11-02: 600 mg via ORAL
  Filled 2017-11-02: qty 1

## 2017-11-02 MED ORDER — LACTATED RINGERS IV SOLN: INTRAVENOUS | Status: DC

## 2017-11-02 MED ORDER — DIPHENHYDRAMINE HCL 50 MG/ML IJ SOLN: 12.5000 mg | INTRAMUSCULAR | Status: DC | PRN

## 2017-11-02 MED ORDER — WITCH HAZEL-GLYCERIN EX PADS: 1.0000 "application " | MEDICATED_PAD | CUTANEOUS | Status: DC | PRN

## 2017-11-02 MED ORDER — SIMETHICONE 80 MG PO CHEW
160.0000 mg | CHEWABLE_TABLET | Freq: Four times a day (QID) | ORAL | Status: DC | PRN
  Administered 2017-11-02 – 2017-11-03 (×4): 160 mg via ORAL
  Filled 2017-11-02 (×4): qty 2

## 2017-11-02 MED ORDER — OXYTOCIN 40 UNITS IN LACTATED RINGERS INFUSION - SIMPLE MED
INTRAVENOUS | Status: AC
  Filled 2017-11-02: qty 1000

## 2017-11-02 MED ORDER — DIBUCAINE 1 % RE OINT: 1.0000 "application " | TOPICAL_OINTMENT | RECTAL | Status: DC | PRN

## 2017-11-02 MED ORDER — DIPHENHYDRAMINE HCL 25 MG PO CAPS: 25.0000 mg | ORAL_CAPSULE | Freq: Four times a day (QID) | ORAL | Status: DC | PRN

## 2017-11-02 MED ORDER — NALOXONE HCL 0.4 MG/ML IJ SOLN: 0.4000 mg | INTRAMUSCULAR | Status: DC | PRN

## 2017-11-02 MED ORDER — ONDANSETRON HCL 4 MG/2ML IJ SOLN: 4.0000 mg | Freq: Three times a day (TID) | INTRAMUSCULAR | Status: DC | PRN

## 2017-11-02 MED ORDER — DIPHENHYDRAMINE HCL 25 MG PO CAPS: 25.0000 mg | ORAL_CAPSULE | ORAL | Status: DC | PRN

## 2017-11-02 MED ORDER — IBUPROFEN 600 MG PO TABS
600.0000 mg | ORAL_TABLET | Freq: Four times a day (QID) | ORAL | Status: DC
  Administered 2017-11-02 – 2017-11-04 (×7): 600 mg via ORAL
  Filled 2017-11-02 (×7): qty 1

## 2017-11-02 MED ORDER — VARICELLA VIRUS VACCINE LIVE 1350 PFU/0.5ML IJ SUSR: 0.5000 mL | Freq: Once | INTRAMUSCULAR | Status: DC

## 2017-11-02 MED ORDER — OXYCODONE HCL 5 MG PO TABS
5.0000 mg | ORAL_TABLET | ORAL | Status: DC | PRN
  Administered 2017-11-03 – 2017-11-04 (×2): 5 mg via ORAL
  Filled 2017-11-02 (×2): qty 1

## 2017-11-02 NOTE — Lactation Note (Signed)
This note was copied from a baby's chart. Lactation Consultation Note  Patient Name: Danielle Sanchez [id number] Date: 11/02/2017 Reason for consult: Initial assessment   Maternal Data Has patient been taught Hand Expression?: Yes Does the patient have breastfeeding experience prior to this delivery?: No  Feeding Feeding Type: Breast Fed Length of feed: 8 min  LATCH Score Latch: Repeated attempts needed to sustain latch, nipple held in mouth throughout feeding, stimulation needed to elicit sucking reflex.  Audible Swallowing: A few with stimulation  Type of Nipple: Everted at rest and after stimulation  Comfort (Breast/Nipple): Soft / non-tender  Hold (Positioning): Assistance needed to correctly position infant at breast and maintain latch.  LATCH Score: 7  Interventions Interventions: Breast feeding basics reviewed;Skin to skin;Assisted with latch;Support pillows;Position options  Lactation Tools Discussed/Used     Consult Status Consult Status: Follow-up Date: 11/02/17 Follow-up type: In-patient  Baby "[name]" breastfeeds very well. Mom needed slight assistance with positioning and latching baby on. Discussed what a quality feeding should look and sound like. What mom's nipple should look like post feeding. Clusterfeeding and milk supply, and the use of coconut oil/comfort gels if needed.   Burnadette Peter 11/02/2017, 10:12 AM

## 2017-11-02 NOTE — Progress Notes (Signed)
Post Partum Day 1 Subjective: Doing well, no complaints.  Tolerating regular diet, has not been OOB yet. Foley remains in place. Pain has been controlled with PO meds.  No CP SOB Fever,Chills, N/V or leg pain; denies nipple or breast pain no HA change of vision, RUQ/epigastric pain  Objective: BP 131/86 (BP Location: Left Arm)   Pulse 84   Temp 98.6 F (37 C) (Oral)   Resp (!) 24   Ht  (1.626 m)   Wt (!) 320 lb 12.3 oz (145.5 kg)   LMP 01/30/2017   SpO2 96%   Breastfeeding? Unknown   BMI 55.06 kg/m    Physical Exam:  General: NAD Breasts: soft/nontender CV: RRR Pulm: nl effort, CTABL Abdomen: soft, NT, BS x 4 Incision:  telfa dressing held in place with abdominal binder; removed binder, scant dark red drainage noted on telfa. NO active drainage, minimal erythema. absorbable staples and medical glue used for closure, incision intact. Lochia: small Uterine Fundus: fundus firm and 1 fb below umbilicus DVT Evaluation: no cords, ttp LEs   Recent Labs    11/01/17 1502 11/02/17 0607  HGB 13.2 11.2*  HCT 39.7 33.7*  WBC 17.2* 26.8*  PLT 151 154    Assessment/Plan: 32 y.o. G1P1001 postpartum day # 1  - Continue routine PP care- Foley cath out today, encouraged ambulation. Discussed timing of DC tomorrow or Sunday.  - Lactation consult.  - Acute blood loss anemia - hemodynamically stable and asymptomatic - Immunization status: Needs varicella prior to DC   Disposition: Does not desire Dc home today.     Vivion Romano A, CNM 11/02/2017  12:27 PM

## 2017-11-02 NOTE — Anesthesia Post-op Follow-up Note (Signed)
Anesthesia QCDR form completed.        

## 2017-11-02 NOTE — Progress Notes (Addendum)
LATE ENTRY  Notified of severe range blood pressures with admin of Labetalol IV Since then have been normal to mild range. Denies: HA, visual changes, SOB, or RUQ/epigastric pain  Will initiate magnesium sulfate if severe range is persistent or if returns >4h from initial value (14:00). Or if has symptoms.  ----- Ranae Plumber, MD Attending Obstetrician and Gynecologist [facility], Department of OB/GYN [facility]

## 2017-11-02 NOTE — Anesthesia Post-op Follow-up Note (Signed)
  Anesthesia Pain Follow-up Note  Patient: Danielle Sanchez  Day #: 1  Date of Follow-up: 11/02/2017 Time: 7:12 AM  Last Vitals:  Vitals:   11/02/17 0422 11/02/17 0537  BP: 126/75 122/78  Pulse: 84 86  Resp: 18 18  Temp: 36.9 C 36.9 C  SpO2: 98% 98%    Level of Consciousness: alert  Pain: mild   Side Effects:None  Catheter Site Exam:clean, dry  Anti-Coag Meds (From admission, onward)   Start     Dose/Rate Route Frequency Ordered Stop   11/03/17 1200  enoxaparin (LOVENOX) injection 40 mg     40 mg Subcutaneous Every 24 hours 11/02/17 0344         Plan: D/C from anesthesia care at surgeon's request  Clydene Pugh

## 2017-11-02 NOTE — Op Note (Addendum)
Cesarean Section Procedure Note  11/01/2017 - 11/02/2017   Patient:  Danielle Sanchez  32 y.o. female at [redacted]w[redacted]d.  Patient's last menstrual period was 01/30/2017. Preoperative diagnosis:  fetal intolerance to labor Postoperative diagnosis:  same as preop  PROCEDURE:  Procedure(s): CESAREAN SECTION (N/A) Surgeon:  Surgeon(s) and Role:    * Bert Ptacek, Elenora Fender, MD - Primary Anesthesia:  spinal I/O: Total I/O In: -  Out: 1000 [Urine:100; Blood:900] Specimens:  Cord Blood, placenta Complications: None Apparent Disposition:  VS stable to PACU  Findings: normal uterus, tubes and ovaries bilaterally, nuchal cord x2,    Danielle Sanchez, Danielle Sanchez [[id number]]  Live born female  Birth Weight: 6 lb 15.5 oz (3160 g) APGAR: 6, 8  Newborn Delivery   Birth date/time:  11/01/2017 23:30:00 Delivery type:  C-Section, Low Transverse C-section categorization:  Primary     Indication for procedure: 32 y.o. female at [redacted]w[redacted]d who was being induced at term for gestational hypertension.  She had been induced for 48 hours and had made slow progress, but with persistent category 2 tracing due to minimal variability.  Patient was exhausted, had inadequate contraction pattern, and did not wish to continue labor efforts; she elected for primary cesarean.  Procedure Details   The risks, benefits, complications, treatment options, and expected outcomes were discussed with the patient. Informed consent was obtained. The patient was taken to Operating Room, identified as Danielle Sanchez and the procedure verified as a cesarean delivery.   After administration of anesthesia, the patient was prepped and draped in the usual sterile manner, including a vaginal prep and TRAXI pannus retractor. A surgical time out was performed, with the pediatric team present. After confirming adequate anesthesia, a Pfannenstiel incision was made and carried down through the subcutaneous tissue to the fascia. Fascial incision was made and extended  transversely. The fascia was separated from the underlying rectus tissue superiorly and inferiorly. The rectus muscles were divided in the midline. The peritoneum was identified and entered. Peritoneal incision was extended longitudinally. Alexis retractor was inserted.  A low transverse uterine incision was made. Delivered from cephalic presentation was a live born female. Delayed cord clamping was performed for 60 seconds, with pediatric observation and approval, during which time we sang happy birthday to baby Danielle Sanchez. The umbilical cord was doubly clamped and cut, and the baby was handed off to the awaitng pediatrician.  Cord blood was obtained for evaluation. The placenta was removed intact and appeared normal. The uterus was delivered from the abdominal cavity and cleared of clots, membranes, and debris. The uterus was boggy and there was heavy bleeding, so Methergine was injected IM by anesthesia. The uterus, tubes and ovaries appeared normal. The uterine incision was closed with running locking sutures of 0 Vicryl, and then a second, imbricating stitch was placed. Hemostasis was observed after an additional figure of eight was placed in the midline. The abdominal cavity was evacuated of extraneous fluid. The uterus was returned to the abdominal cavity and again the incision was inspected for hemostasis, which was confirmed.  The paracolic gutters were cleaned. The fascia was then reapproximated with running suture of vicryl. 80cc of Long- and short-acting bupivicaine was injected circumferentially into the fascia.  After a change of gloves, the subcutaneous tissue was irrigated and reapproximated with 2-0 plain gut. The skin was closed with absorbable staples, and 20cc of long- and short-acting bupivacaine injected into the skin and subcutaneous tissues.  The incision was covered with surgical glue.     Instrument, sponge,  and needle counts were correct prior the abdominal closure and at the conclusion of the  case.   I was present and performed this procedure in its entirety.  VTE: SCDs Perioperative antibiotics: Ancef 3g  ----- Ranae Plumber, MD Attending Obstetrician and Gynecologist [facility], Department of OB/GYN [facility]

## 2017-11-02 NOTE — Transfer of Care (Signed)
Immediate Anesthesia Transfer of Care Note  Patient: Danielle Sanchez  Procedure(s) Performed: CESAREAN SECTION (N/A )  Patient Location: PACU  Anesthesia Type:Epidural  Level of Consciousness: awake, alert  and oriented  Airway & Oxygen Therapy: Patient Spontanous Breathing  Post-op Assessment: Post -op Vital signs reviewed and stable  Post vital signs: stable  Last Vitals:  Vitals Value Taken Time  BP 130/91 11/02/2017  1:07 AM  Temp 36.9 C 11/02/2017  1:07 AM  Pulse 85 11/02/2017  1:07 AM  Resp 12 11/02/2017  1:07 AM  SpO2 100 % 11/02/2017  1:07 AM    Last Pain:  Vitals:   11/02/17 0107  TempSrc: Oral  PainSc:          Complications: No apparent anesthesia complications

## 2017-11-02 NOTE — Progress Notes (Signed)
Intrapartum progress note  S: patient frustrated by lack of progress, tearful and exhausted IUPC had been placed for internal contraction monitoring overnight but was displaced and removed/fell out.  Last report of cervix check was 3am  Pitocin was discontinued this morning so patent could eat breakfast and shower.  Denies: HA, visual changes, SOB, or RUQ/epigastric pain   O: 144/81 97.5  90  16    FHT: 140 mod + accels no decels TOCO: difficult to monitor SVE: 4/70/-2 forebag AROM'd and IUPC replaced  A/P: 31yo G1P0 with IOL forGHTN  1. HTN: no s/sx preeclampsia 2. IOL: s/p misoprostol x 4, pitocin, SROM, now AROM - restart pitocin 3. Will check for progress and base decisions on this.  ----- Ranae Plumber, MD Attending Obstetrician and Gynecologist [facility], Department of OB/GYN [facility]

## 2017-11-02 NOTE — Anesthesia Postprocedure Evaluation (Signed)
Anesthesia Post Note  Patient: Danielle Sanchez  Procedure(s) Performed: CESAREAN SECTION (N/A )  Patient location during evaluation: Mother Baby Anesthesia Type: Epidural Level of consciousness: awake and alert and oriented Pain management: satisfactory to patient Vital Signs Assessment: post-procedure vital signs reviewed and stable Respiratory status: respiratory function stable Cardiovascular status: stable Postop Assessment: no backache, no headache, epidural receding, no apparent nausea or vomiting, patient able to bend at knees, adequate PO intake and able to ambulate Anesthetic complications: no     Last Vitals:  Vitals:   11/02/17 0422 11/02/17 0537  BP: 126/75 122/78  Pulse: 84 86  Resp: 18 18  Temp: 36.9 C 36.9 C  SpO2: 98% 98%    Last Pain:  Vitals:   11/02/17 0553  TempSrc:   PainSc: 1                  Clydene Pugh

## 2017-11-03 LAB — CREATININE, SERUM
Creatinine, Ser: 0.98 mg/dL (ref 0.44–1.00)
GFR calc Af Amer: >60 mL/min (ref >60)

## 2017-11-03 LAB — FETAL SCREEN: FETAL SCREEN: NEGATIVE

## 2017-11-03 MED ORDER — RHO D IMMUNE GLOBULIN 1500 UNIT/2ML IJ SOSY
300.0000 ug | PREFILLED_SYRINGE | Freq: Once | INTRAMUSCULAR | Status: AC
  Administered 2017-11-03: 300 ug via INTRAMUSCULAR
  Filled 2017-11-03: qty 2

## 2017-11-03 MED ORDER — CYCLOBENZAPRINE HCL 10 MG PO TABS
5.0000 mg | ORAL_TABLET | Freq: Three times a day (TID) | ORAL | Status: DC | PRN
  Administered 2017-11-03 – 2017-11-04 (×3): 5 mg via ORAL
  Filled 2017-11-03 (×3): qty 0.5

## 2017-11-03 MED ORDER — DOCUSATE SODIUM 100 MG PO CAPS
100.0000 mg | ORAL_CAPSULE | Freq: Two times a day (BID) | ORAL | Status: DC
  Administered 2017-11-03 – 2017-11-04 (×3): 100 mg via ORAL
  Filled 2017-11-03 (×3): qty 1

## 2017-11-03 NOTE — Discharge Summary (Signed)
Obstetrical Discharge Summary  Patient Name: Danielle Sanchez DOB: 1985-10-10 MRN: [id number]  Date of Admission: 10/30/2017 Date of Delivery: 11/01/17 Delivered by: Ranae Plumber, MD Date of Discharge: 11/04/2017  Primary OB: [facility]  BJY:[id number] last menstrual period was 01/30/2017. EDC Estimated Date of Delivery: 11/06/17 Gestational Age at Delivery: [redacted]w[redacted]d   Antepartum complications:  1. gTHN 2. Obesity: s/p anesthesia consult x2, last at 36wks cleared by Dr. Pernell Dupre 3. Varicella non immune 4. Rh neg   Admitting Diagnosis: induction of labor for gestational hypertension at term.   Secondary Diagnosis: Patient Active Problem List   Diagnosis Date Noted  . Gestational hypertension w/o significant proteinuria in 3rd trimester 10/30/2017  . Gestational hypertension 10/25/2017  . Pregnancy 09/24/2017  . Maternal obesity, antepartum 05/28/2017  . Elevated prolactin level (HCC) 05/28/2017    Augmentation: Pitocin and Cytotec Complications: None Intrapartum complications/course: [redacted]w[redacted]d who was being induced at term for gestational hypertension.  She had been induced via Cytotec, Pitocin, (SROM) for 48 hours and had made slow progress, but developed persistent category 2 tracing due to minimal variability.  Patient was exhausted, had inadequate contraction pattern, and did not wish to continue labor efforts; she elected for primary cesarean.  Date of Delivery: 11/01/17 Delivered By: Leeroy Bock Ward Delivery Type: primary cesarean section, low transverse incision Anesthesia: epidural Placenta: expressed Laceration: n/a Episiotomy: none Newborn Data: Live born female "Danielle Sanchez" Birth Weight: 6 lb 15.5 oz (3160 g) APGAR: 6, 8  Newborn Delivery   Birth date/time:  11/01/2017 23:30:00 Delivery type:  C-Section, Low Transverse C-section categorization:  Primary     Postpartum Procedures: none  Post partum course: none Patient had an uncomplicated postpartum course.  By time of  discharge on POD#3, her pain was controlled on oral pain medications; she had appropriate lochia and was ambulating, voiding without difficulty, tolerating regular diet and passing flatus.   She was deemed stable for discharge to home.    Discharge Physical Exam:  BP 129/75 (BP Location: Left Arm)   Pulse 87   Temp 98.1 F (36.7 C) (Oral)   Resp 20   Ht  (1.626 m)   Wt (!) 320 lb 3.2 oz (145.2 kg)   LMP 01/30/2017   SpO2 99%   Breastfeeding? Unknown   BMI 54.96 kg/m   General: NAD CV: RRR Pulm: CTABL, nl effort ABD: s/nd/nt, fundus firm and below the umbilicus Lochia: small Incision: c/d/i DVT Evaluation: LE non-ttp, no evidence of DVT on exam.  Hemoglobin  Date Value Ref Range Status  11/04/2017 8.7 (L) 12.0 - 16.0 g/dL Final   HCT  Date Value Ref Range Status  11/04/2017 26.0 (L) 35.0 - 47.0 % Final     Disposition: stable, discharge to home. Baby Feeding: breastmilk Baby Disposition: home with mom  Rh Immune globulin given: 11/03/17 Rubella vaccine given: n/a Varivax:  given Tdap vaccine given in AP or PP setting: AP Flu vaccine given in AP or PP setting: AP  Contraception: POP or Nexplanon  Prenatal Labs:  Blood type/Rh  O neg  Antibody screen neg  Rubella Immune  Varicella Immune  RPR NR  HBsAg Neg  HIV NR  GC neg  Chlamydia neg  Genetic screening negative  1 hour GTT 168  3 hour GTT 136/139/179  GBS neg    Plan:  Danielle Sanchez was discharged to home in good condition. Follow-up appointment with Dr. Elesa Massed in 2 weeks.  Discharge Medications: Allergies as of 11/04/2017      Reactions  Azithromycin Swelling      Medication List    STOP taking these medications   aspirin 81 MG chewable tablet     TAKE these medications   acetaminophen 500 MG tablet Commonly known as:  TYLENOL Take 2 tablets (1,000 mg total) by mouth every 6 (six) hours.   cyclobenzaprine 5 MG tablet Commonly known as:  FLEXERIL Take 1 tablet (5 mg total) by mouth  3 (three) times daily as needed for muscle spasms.   docusate sodium 100 MG capsule Commonly known as:  COLACE Take 1 capsule (100 mg total) by mouth 2 (two) times daily.   enoxaparin 40 MG/0.4ML injection Commonly known as:  LOVENOX Inject 0.4 mLs (40 mg total) into the skin daily for 14 days.   ibuprofen 600 MG tablet Commonly known as:  ADVIL,MOTRIN Take 1 tablet (600 mg total) by mouth every 6 (six) hours.   oxyCODONE 5 MG immediate release tablet Commonly known as:  Oxy IR/ROXICODONE Take 1 tablet (5 mg total) by mouth every 4 (four) hours as needed (pain scale 4-7).   prenatal multivitamin Tabs tablet Take 1 tablet by mouth daily at 12 noon.   ranitidine 150 MG tablet Commonly known as:  ZANTAC Take 150 mg by mouth 2 (two) times daily.   simethicone 80 MG chewable tablet Commonly known as:  MYLICON Chew 2 tablets (160 mg total) by mouth 4 (four) times daily as needed for flatulence.       Follow-up Information    Ward, Elenora Fender, MD Follow up in 2 week(s).   Specialty:  Obstetrics and Gynecology Contact information: [address] [place] [address] [phone]           Signed: Randa Ngo, CNM 11/04/2017 9:37 AM

## 2017-11-03 NOTE — Progress Notes (Addendum)
Post Partum Day 2  Subjective: Doing well, no complaints.  Tolerating regular diet, pain with PO meds, voiding and ambulating without difficulty. No CP SOB Fever,Chills, N/V or leg pain; denies nipple or breast pain, no HA change of vision, RUQ/epigastric pain. Having neck pain with movement, passing gas without difficulty, no BM yet.   Objective: BP 119/64 (BP Location: Right Arm)   Pulse 75   Temp 98.4 F (36.9 C) (Oral)   Resp 20   Ht  (1.626 m)   Wt (!) 317 lb 12.8 oz (144.2 kg)   LMP 01/30/2017   SpO2 100%   Breastfeeding? Unknown   BMI 54.55 kg/m    Physical Exam:  General: NAD Breasts: soft/nontender  CV: RRR Pulm: nl effort, CTABL Abdomen: soft, NT, BS x 4 Incision: Incision with absorbable staples and medical glue intact/no erythema or drainage Lochia: small Uterine Fundus: fundus firm and 1 fb below umbilicus DVT Evaluation: no cords, ttp LEs   Recent Labs    11/01/17 1502 11/02/17 0607  HGB 13.2 11.2*  HCT 39.7 33.7*  WBC 17.2* 26.8*  PLT 151 154    Assessment/Plan: 31 y.o. G1P1002 postpartum day # 2  - Continue routine PP care; encouraged neck stretching, alternating heat/ice packs for comfort. Will add Flexeril prn.  - Lactation consult prn - Discussed contraceptive options: undecided between POPs and nexplanon.  - Immunization status: Needs varicella  prior to DC    Disposition: Does not desire Dc home today.     Akram Kissick A, CNM 11/03/2017  8:13 AM

## 2017-11-04 LAB — RHOGAM INJECTION: Unit division: 0

## 2017-11-04 LAB — CBC WITH DIFFERENTIAL/PLATELET
Basophils Absolute: 0 10*3/uL (ref 0–0.1)
Basophils Relative: 0 %
EOS ABS: 0.1 10*3/uL (ref 0–0.7)
EOS PCT: 1 %
HCT: 26 % — ABNORMAL LOW (ref 35.0–47.0)
HEMOGLOBIN: 8.7 g/dL — AB (ref 12.0–16.0)
LYMPHS ABS: 1.5 10*3/uL (ref 1.0–3.6)
Lymphocytes Relative: 16 %
MCH: 29.4 pg (ref 26.0–34.0)
MCHC: 33.7 g/dL (ref 32.0–36.0)
MCV: 87.4 fL (ref 80.0–100.0)
MONOS PCT: 5 %
Monocytes Absolute: 0.5 10*3/uL (ref 0.2–0.9)
Neutro Abs: 7.7 10*3/uL — ABNORMAL HIGH (ref 1.4–6.5)
Neutrophils Relative %: 78 %
PLATELETS: 154 10*3/uL (ref 150–440)
RBC: 2.97 MIL/uL — AB (ref 3.80–5.20)
RDW: 16.2 % — ABNORMAL HIGH (ref 11.5–14.5)
WBC: 9.8 10*3/uL (ref 3.6–11.0)

## 2017-11-04 MED ORDER — CYCLOBENZAPRINE HCL 5 MG PO TABS: 5.0000 mg | ORAL_TABLET | Freq: Three times a day (TID) | ORAL | 0 refills | Status: DC | PRN

## 2017-11-04 MED ORDER — DOCUSATE SODIUM 100 MG PO CAPS: 100.0000 mg | ORAL_CAPSULE | Freq: Two times a day (BID) | ORAL | 0 refills | Status: DC

## 2017-11-04 MED ORDER — ENOXAPARIN SODIUM 40 MG/0.4ML ~~LOC~~ SOLN: 40.0000 mg | SUBCUTANEOUS | 0 refills | Status: DC

## 2017-11-04 MED ORDER — FERROUS SULFATE 325 (65 FE) MG PO TABS: 325.0000 mg | ORAL_TABLET | Freq: Two times a day (BID) | ORAL | 3 refills | Status: DC

## 2017-11-04 MED ORDER — OXYCODONE HCL 5 MG PO TABS: 5.0000 mg | ORAL_TABLET | ORAL | 0 refills | Status: DC | PRN

## 2017-11-04 MED ORDER — ACETAMINOPHEN 500 MG PO TABS: 1000.0000 mg | ORAL_TABLET | Freq: Four times a day (QID) | ORAL | 0 refills | Status: DC

## 2017-11-04 MED ORDER — SIMETHICONE 80 MG PO CHEW: 160.0000 mg | CHEWABLE_TABLET | Freq: Four times a day (QID) | ORAL | 0 refills | Status: DC | PRN

## 2017-11-04 MED ORDER — IBUPROFEN 600 MG PO TABS: 600.0000 mg | ORAL_TABLET | Freq: Four times a day (QID) | ORAL | 0 refills | Status: DC

## 2017-11-04 NOTE — Progress Notes (Signed)
Pt states that she received her TDaP vaccine during pregnancy.  Education provided on need for Varicella vaccine.  Pt declines all vaccines at this time. Reynold Bowen, RN 11/04/2017 11:33 AM

## 2017-11-04 NOTE — Progress Notes (Addendum)
Post Partum Day 3 Subjective: Doing well, no complaints.  Tolerating regular diet, pain with PO meds, voiding and ambulating without difficulty.  No CP SOB Fever,Chills, N/V or leg pain; denies nipple or breast pain no HA change of vision, RUQ/epigastric pain  Objective: BP 129/75 (BP Location: Left Arm)   Pulse 87   Temp 98.1 F (36.7 C) (Oral)   Resp 20   Ht  (1.626 m)   Wt (!) 320 lb 3.2 oz (145.2 kg)   LMP 01/30/2017   SpO2 99%   Breastfeeding? Unknown   BMI 54.96 kg/m  continues to have occasional mild range BP.    Physical Exam:  General: NAD Breasts: soft/nontender CV: RRR Pulm: nl effort, CTABL Abdomen: soft, NT, BS x 4 Incision: dermabond and insorb staples intact, no e/o dehiscence. no erythema or drainage Lochia: small Uterine Fundus: fundus firm and 1 fb below umbilicus DVT Evaluation: no cords, ttp LEs   Recent Labs    11/02/17 0607 11/04/17 0814  HGB 11.2* 8.7*  HCT 33.7* 26.0*  WBC 26.8* 9.8  PLT 154 154    Assessment/Plan: 32 y.o. G1P1002 postpartum day # 3  - Continue routine PP care; WBC normal now, to take FeSO4 BID - Lactation consult - reviewed dc instructions and followup. - Immunization status: Needs varicella prior to DC    Disposition: Does desire Dc home today.     Aritha Huckeba A, CNM 11/04/2017  9:38 AM

## 2017-11-04 NOTE — Progress Notes (Signed)
Discharge instructions provided.  Pt and sig other verbalize understanding of all instructions and follow-up care.  Pt discharged to home with infant at 1343 on 11/04/17 via wheelchair by volunteer. Reynold Bowen, RN. 11/04/2017  3:58 PM

## 2017-11-04 NOTE — Discharge Instructions (Signed)
Call ypur doctor for increased pain or vaginal bleeding, temperature above 100.4, depression, or concerns.  Increase calories and fluids while breastfeeding.  Continue prenatal vitamin and iron.  No strenuous activity or heavy lifting for 6 week.  No intercourse, tampons, douching, or enemas for 6 weeks.  No tub baths- showers only.  No driving for 6 weeks or while taking pain medications.

## 2017-12-18 DIAGNOSIS — Z3200 Encounter for pregnancy test, result unknown: Secondary | ICD-10-CM | POA: Diagnosis not present

## 2017-12-18 DIAGNOSIS — Z30017 Encounter for initial prescription of implantable subdermal contraceptive: Secondary | ICD-10-CM | POA: Diagnosis not present

## 2018-01-23 DIAGNOSIS — M5418 Radiculopathy, sacral and sacrococcygeal region: Secondary | ICD-10-CM | POA: Diagnosis not present

## 2018-01-23 DIAGNOSIS — M545 Low back pain: Secondary | ICD-10-CM | POA: Diagnosis not present

## 2018-01-23 DIAGNOSIS — M9905 Segmental and somatic dysfunction of pelvic region: Secondary | ICD-10-CM | POA: Diagnosis not present

## 2018-01-23 DIAGNOSIS — M9904 Segmental and somatic dysfunction of sacral region: Secondary | ICD-10-CM | POA: Diagnosis not present

## 2018-01-24 DIAGNOSIS — M9904 Segmental and somatic dysfunction of sacral region: Secondary | ICD-10-CM | POA: Diagnosis not present

## 2018-01-24 DIAGNOSIS — M9905 Segmental and somatic dysfunction of pelvic region: Secondary | ICD-10-CM | POA: Diagnosis not present

## 2018-01-24 DIAGNOSIS — M5418 Radiculopathy, sacral and sacrococcygeal region: Secondary | ICD-10-CM | POA: Diagnosis not present

## 2018-01-24 DIAGNOSIS — M545 Low back pain: Secondary | ICD-10-CM | POA: Diagnosis not present

## 2018-01-28 DIAGNOSIS — M9904 Segmental and somatic dysfunction of sacral region: Secondary | ICD-10-CM | POA: Diagnosis not present

## 2018-01-28 DIAGNOSIS — M545 Low back pain: Secondary | ICD-10-CM | POA: Diagnosis not present

## 2018-01-28 DIAGNOSIS — M9905 Segmental and somatic dysfunction of pelvic region: Secondary | ICD-10-CM | POA: Diagnosis not present

## 2018-01-28 DIAGNOSIS — M5418 Radiculopathy, sacral and sacrococcygeal region: Secondary | ICD-10-CM | POA: Diagnosis not present

## 2018-02-01 DIAGNOSIS — M9905 Segmental and somatic dysfunction of pelvic region: Secondary | ICD-10-CM | POA: Diagnosis not present

## 2018-02-01 DIAGNOSIS — M5418 Radiculopathy, sacral and sacrococcygeal region: Secondary | ICD-10-CM | POA: Diagnosis not present

## 2018-02-01 DIAGNOSIS — M9904 Segmental and somatic dysfunction of sacral region: Secondary | ICD-10-CM | POA: Diagnosis not present

## 2018-02-01 DIAGNOSIS — M545 Low back pain: Secondary | ICD-10-CM | POA: Diagnosis not present

## 2018-02-04 DIAGNOSIS — M545 Low back pain: Secondary | ICD-10-CM | POA: Diagnosis not present

## 2018-02-04 DIAGNOSIS — M9905 Segmental and somatic dysfunction of pelvic region: Secondary | ICD-10-CM | POA: Diagnosis not present

## 2018-02-04 DIAGNOSIS — M9904 Segmental and somatic dysfunction of sacral region: Secondary | ICD-10-CM | POA: Diagnosis not present

## 2018-02-04 DIAGNOSIS — M5418 Radiculopathy, sacral and sacrococcygeal region: Secondary | ICD-10-CM | POA: Diagnosis not present

## 2018-02-07 DIAGNOSIS — M5418 Radiculopathy, sacral and sacrococcygeal region: Secondary | ICD-10-CM | POA: Diagnosis not present

## 2018-02-07 DIAGNOSIS — M545 Low back pain: Secondary | ICD-10-CM | POA: Diagnosis not present

## 2018-02-07 DIAGNOSIS — M9905 Segmental and somatic dysfunction of pelvic region: Secondary | ICD-10-CM | POA: Diagnosis not present

## 2018-02-07 DIAGNOSIS — M9904 Segmental and somatic dysfunction of sacral region: Secondary | ICD-10-CM | POA: Diagnosis not present

## 2018-02-08 DIAGNOSIS — M5418 Radiculopathy, sacral and sacrococcygeal region: Secondary | ICD-10-CM | POA: Diagnosis not present

## 2018-02-08 DIAGNOSIS — M9904 Segmental and somatic dysfunction of sacral region: Secondary | ICD-10-CM | POA: Diagnosis not present

## 2018-02-08 DIAGNOSIS — M545 Low back pain: Secondary | ICD-10-CM | POA: Diagnosis not present

## 2018-02-08 DIAGNOSIS — M9905 Segmental and somatic dysfunction of pelvic region: Secondary | ICD-10-CM | POA: Diagnosis not present

## 2018-04-11 ENCOUNTER — Encounter (HOSPITAL_COMMUNITY): Payer: Self-pay | Admitting: Obstetrics and Gynecology

## 2018-04-28 DIAGNOSIS — Z23 Encounter for immunization: Secondary | ICD-10-CM | POA: Diagnosis not present

## 2018-05-26 DIAGNOSIS — Y33XXXA Other specified events, undetermined intent, initial encounter: Secondary | ICD-10-CM | POA: Diagnosis not present

## 2018-05-26 DIAGNOSIS — S99922A Unspecified injury of left foot, initial encounter: Secondary | ICD-10-CM | POA: Diagnosis not present

## 2018-05-26 DIAGNOSIS — S99912A Unspecified injury of left ankle, initial encounter: Secondary | ICD-10-CM | POA: Diagnosis not present

## 2018-05-26 DIAGNOSIS — M7989 Other specified soft tissue disorders: Secondary | ICD-10-CM | POA: Diagnosis not present

## 2018-06-14 ENCOUNTER — Encounter: Payer: Self-pay | Admitting: Physician Assistant

## 2018-06-14 ENCOUNTER — Ambulatory Visit (INDEPENDENT_AMBULATORY_CARE_PROVIDER_SITE_OTHER): Payer: BLUE CROSS/BLUE SHIELD | Admitting: Physician Assistant

## 2018-06-14 VITALS — BP 116/79 | HR 87 | Temp 97.7°F | Resp 16 | Ht 64.0 in | Wt 289.0 lb

## 2018-06-14 DIAGNOSIS — R7303 Prediabetes: Secondary | ICD-10-CM | POA: Diagnosis not present

## 2018-06-14 DIAGNOSIS — Z13220 Encounter for screening for lipoid disorders: Secondary | ICD-10-CM | POA: Diagnosis not present

## 2018-06-14 DIAGNOSIS — D72829 Elevated white blood cell count, unspecified: Secondary | ICD-10-CM | POA: Diagnosis not present

## 2018-06-14 DIAGNOSIS — R739 Hyperglycemia, unspecified: Secondary | ICD-10-CM | POA: Diagnosis not present

## 2018-06-14 NOTE — Progress Notes (Signed)
Patient: Danielle OfficerKristie Sanchez, Female    DOB: 06/01/1986, 32 y.o.   MRN: [id number] Visit Date: 06/14/2018  Today's Provider: Trey SailorsAdriana M Pollak, PA-C   Chief Complaint  Patient presents with  . Establish Care   Subjective:    Establish Care: Danielle Sanchez is a 32 y.o. female who presents today to establish care. She feels fairly well. She reports exercising none. She reports she is sleeping fairly well. Per patient she never had a PCP. Lived [place] in [place]. Living in [place] with husband of for 3 years and son born in May 2019. Currently breastfeeding. Nexplanon in left arm placed in July 2019. Works as a Teacher, adult educationlogistics specialist.  PAP smear: 04/12/2017 normal and negative HPV with [facility]  Flu shot: 04/18/2018 [facility]   Patient was given Amox-Clav (augmentin) yesterday by Esperanza Sheetsana Chambers - virtual visit for Sinus Infection.  Reports she previously had an A1c reading indicating prediabetes. Has struggled with weight loss. Struggles to exercise, drives an hour each way to work.  -----------------------------------------------------------------   Review of Systems  Constitutional: Negative.   HENT: Positive for congestion and sinus pressure.   Eyes: Negative.   Respiratory: Positive for cough.   Cardiovascular: Negative.   Gastrointestinal: Negative.   Endocrine: Negative.   Genitourinary: Negative.   Musculoskeletal: Negative.   Skin: Negative.   Allergic/Immunologic: Negative.   Neurological: Negative.   Hematological: Negative.   Psychiatric/Behavioral: Negative.     Social History      She  reports that she has never smoked. She has never used smokeless tobacco. She reports that she does not drink alcohol or use drugs.       Social History   Socioeconomic History  . Marital status: Married    Spouse name: Not on file  . Number of children: Not on file  . Years of education: Not on file  . Highest education level: Not on file  Occupational History    . Not on file  Social Needs  . Financial resource strain: Not on file  . Food insecurity:    Worry: Not on file    Inability: Not on file  . Transportation needs:    Medical: Not on file    Non-medical: Not on file  Tobacco Use  . Smoking status: Never Smoker  . Smokeless tobacco: Never Used  Substance and Sexual Activity  . Alcohol use: No    Frequency: Never  . Drug use: No  . Sexual activity: Yes    Birth control/protection: Pill  Lifestyle  . Physical activity:    Days per week: Not on file    Minutes per session: Not on file  . Stress: Not on file  Relationships  . Social connections:    Talks on phone: Not on file    Gets together: Not on file    Attends religious service: Not on file    Active member of club or organization: Not on file    Attends meetings of clubs or organizations: Not on file    Relationship status: Not on file  Other Topics Concern  . Not on file  Social History Narrative  . Not on file    Past Medical History:  Diagnosis Date  . Allergy   . Anxiety   . Medical history non-contributory   . Pregnancy induced hypertension      Patient Active Problem List   Diagnosis Date Noted  . Gestational hypertension w/o significant proteinuria in 3rd trimester 10/30/2017  .  Gestational hypertension 10/25/2017  . Pregnancy 09/24/2017  . Maternal obesity, antepartum 05/28/2017  . Elevated prolactin level (HCC) 05/28/2017    Past Surgical History:  Procedure Laterality Date  . ANTERIOR CRUCIATE LIGAMENT REPAIR Right   . bone spur Left   . CESAREAN SECTION N/A 11/01/2017   Procedure: CESAREAN SECTION;  Surgeon: Ward, Elenora Fender, MD;  Location: [facility] ORS;  Service: Obstetrics;  Laterality: N/A;  . MENISCUS REPAIR Right   . WISDOM TOOTH EXTRACTION  2017    Family History        Family Status  Relation Name Status  . MGM  (Not Specified)  . Mother  (Not Specified)        Her family history includes Diabetes in her maternal grandmother;  Hypertension in her maternal grandmother; Hypotension in her mother.      Allergies  Allergen Reactions  . Azithromycin Swelling     Current Outpatient Medications:  .  acetaminophen (TYLENOL) 500 MG tablet, Take 2 tablets (1,000 mg total) by mouth every 6 (six) hours., Disp: 30 tablet, Rfl: 0 .  etonogestrel (NEXPLANON) 68 MG IMPL implant, Inject into the skin., Disp: , Rfl:    Patient Care Team: Maryella Shivers as PCP - General (Physician Assistant)      Objective:   Vitals: BP 116/79 (BP Location: Right Arm, Patient Position: Sitting, Cuff Size: Large)   Pulse 87   Temp 97.7 F (36.5 C) (Oral)   Resp 16   Ht 5\' 4"  (1.626 m)   Wt 289 lb (131.1 kg)   BMI 49.61 kg/m    Vitals:   06/14/18 0859  BP: 116/79  Pulse: 87  Resp: 16  Temp: 97.7 F (36.5 C)  TempSrc: Oral  Weight: 289 lb (131.1 kg)  Height: 5\' 4"  (1.626 m)     Physical Exam Constitutional:      Appearance: Normal appearance. She is obese.  Cardiovascular:     Rate and Rhythm: Normal rate and regular rhythm.  Pulmonary:     Effort: Pulmonary effort is normal.     Breath sounds: Normal breath sounds.  Abdominal:     General: Abdomen is flat.     Palpations: Abdomen is soft.     Tenderness: There is no abdominal tenderness.  Neurological:     Mental Status: She is alert and oriented to person, place, and time. Mental status is at baseline.  Psychiatric:        Mood and Affect: Mood normal.        Behavior: Behavior normal.      Depression Screen PHQ 2/9 Scores 06/14/2018  PHQ - 2 Score 0  PHQ- 9 Score 5      Assessment & Plan:     Routine Health Maintenance and Physical Exam  Exercise Activities and Dietary recommendations Goals   None     There is no immunization history for the selected administration types on file for this patient.  Health Maintenance  Topic Date Due  . HIV Screening  02/01/2003  . TETANUS/TDAP  02/01/2007  . PAP SMEAR-Modifier  01/31/2009  .  INFLUENZA VACCINE  01/31/2018     Discussed health benefits of physical activity, and encouraged her to engage in regular exercise appropriate for her age and condition.   1. Prediabetes  Will recheck labs as below. Recent CMET and TSH in care everywhere normal. Instructed on losing weight and reducing sugar intake to help with this. She is still breastfeeding. Could likely start metformin depending  on lab results. Bernie Covey would not be option until she is done breast feeding.   - HgB A1c  2. Screening cholesterol level  - Lipid Profile  3. Leukocytosis, unspecified type  - CBC with Differential  4. Morbid obesity (HCC)  See #1. Follow up pending labwork.   The entirety of the information documented in the History of Present Illness, Review of Systems and Physical Exam were personally obtained by me. Portions of this information were initially documented by Hetty Ely, CMA and reviewed by me for thoroughness and accuracy.    --------------------------------------------------------------------    Trey Sailors, PA-C  [facility]

## 2018-06-14 NOTE — Patient Instructions (Signed)

## 2018-06-15 LAB — CBC WITH DIFFERENTIAL/PLATELET
Basophils Absolute: 0 10*3/uL (ref 0.0–0.2)
Basos: 0 %
EOS (ABSOLUTE): 0.1 10*3/uL (ref 0.0–0.4)
Eos: 1 %
Hematocrit: 43 % (ref 34.0–46.6)
Hemoglobin: 14.2 g/dL (ref 11.1–15.9)
Immature Grans (Abs): 0 10*3/uL (ref 0.0–0.1)
Immature Granulocytes: 0 %
Lymphocytes Absolute: 1.9 10*3/uL (ref 0.7–3.1)
Lymphs: 18 %
MCH: 26.6 pg (ref 26.6–33.0)
MCHC: 33 g/dL (ref 31.5–35.7)
MCV: 81 fL (ref 79–97)
Monocytes Absolute: 0.4 10*3/uL (ref 0.1–0.9)
Monocytes: 4 %
Neutrophils Absolute: 8.2 10*3/uL — ABNORMAL HIGH (ref 1.4–7.0)
Neutrophils: 77 %
Platelets: 217 10*3/uL (ref 150–450)
RBC: 5.33 x10E6/uL — ABNORMAL HIGH (ref 3.77–5.28)
RDW: 13.1 % (ref 12.3–15.4)
WBC: 10.6 10*3/uL (ref 3.4–10.8)

## 2018-06-15 LAB — HEMOGLOBIN A1C
Est. average glucose Bld gHb Est-mCnc: 120 mg/dL
Hgb A1c MFr Bld: 5.8 % — ABNORMAL HIGH (ref 4.8–5.6)

## 2018-06-15 LAB — LIPID PANEL
Chol/HDL Ratio: 4.1 ratio (ref 0.0–4.4)
Cholesterol, Total: 120 mg/dL (ref 100–199)
HDL: 29 mg/dL — ABNORMAL LOW (ref >39)
LDL Calculated: 61 mg/dL (ref 0–99)
Triglycerides: 152 mg/dL — ABNORMAL HIGH (ref 0–149)
VLDL Cholesterol Cal: 30 mg/dL (ref 5–40)

## 2018-06-18 ENCOUNTER — Telehealth: Payer: Self-pay

## 2018-06-18 NOTE — Telephone Encounter (Signed)
Spoke with patient and she scheduled for three month follow-up A1C

## 2018-06-18 NOTE — Telephone Encounter (Signed)
LMTCB

## 2018-06-18 NOTE — Telephone Encounter (Signed)
-----   Message from Trey SailorsAdriana M Pollak, [place]-C sent at 06/17/2018  2:08 PM EST ----- A1c came back in prediabetic range. Cholesterol well controlled. CBC normal. Suggest making visit in 3-6 months for follow up or when shes breast feeding to talk about management strategies.

## 2018-06-18 NOTE — Telephone Encounter (Signed)
Patient is returning your call and requesting a call back. °

## 2018-09-18 ENCOUNTER — Encounter: Payer: Self-pay | Admitting: Physician Assistant

## 2018-09-18 ENCOUNTER — Ambulatory Visit (INDEPENDENT_AMBULATORY_CARE_PROVIDER_SITE_OTHER): Payer: BLUE CROSS/BLUE SHIELD | Admitting: Physician Assistant

## 2018-09-18 ENCOUNTER — Other Ambulatory Visit: Payer: Self-pay

## 2018-09-18 DIAGNOSIS — R7303 Prediabetes: Secondary | ICD-10-CM

## 2018-09-18 LAB — POCT GLYCOSYLATED HEMOGLOBIN (HGB A1C): Hemoglobin A1C: 5.4 % (ref 4.0–5.6)

## 2018-09-18 MED ORDER — LIRAGLUTIDE -WEIGHT MANAGEMENT 18 MG/3ML ~~LOC~~ SOPN
0.6000 mg | PEN_INJECTOR | Freq: Every morning | SUBCUTANEOUS | 0 refills | Status: DC
Start: 2018-09-18 — End: 2018-11-22

## 2018-09-18 MED ORDER — INSULIN PEN NEEDLE 32G X 6 MM MISC: 1 refills | Status: DC

## 2018-09-18 NOTE — Progress Notes (Signed)
Patient: Danielle Sanchez Female    DOB: [birth year]   32 y.o.   MRN: [id number] Visit Date: 09/18/2018  Today's Provider: Trey Sailors, PA-C   Chief Complaint  Patient presents with   Hyperglycemia   Subjective:     HPI  Prediabetes, Follow-up:   Lab Results  Component Value Date   HGBA1C 5.4 09/18/2018   HGBA1C 5.8 (H) 06/14/2018   GLUCOSE 105 (H) 10/30/2017   GLUCOSE 93 10/27/2017   GLUCOSE 69 10/25/2017    Last seen for for this 3 months ago.  Management since that visit includes none. Current symptoms include none and have been unchanged.  Weight trend: increasing steadily Prior visit with dietician: yes -  Current diet: well balanced Current exercise: walking and cardio. Joined [employer]. Has joined prediabetes program as part of insurance. Working out twice per week at least and going on walks   Eating habits: tracking food and recording food in log. Reducing calories, checking in with prediabetes manager through insurance.   Pertinent Labs:    Component Value Date/Time   CHOL 120 06/14/2018 1007   TRIG 152 (H) 06/14/2018 1007   CHOLHDL 4.1 06/14/2018 1007   CREATININE 0.98 11/03/2017 0520    Wt Readings from Last 3 Encounters:  09/18/18 292 lb 12.8 oz (132.8 kg)  06/14/18 289 lb (131.1 kg)  11/04/17 (!) 320 lb 3.2 oz (145.2 kg)    Allergies  Allergen Reactions   Azithromycin Swelling     Current Outpatient Medications:    acetaminophen (TYLENOL) 500 MG tablet, Take 2 tablets (1,000 mg total) by mouth every 6 (six) hours., Disp: 30 tablet, Rfl: 0   etonogestrel (NEXPLANON) 68 MG IMPL implant, Inject into the skin., Disp: , Rfl:   Review of Systems  ROS negative except for HPI.   Social History   Tobacco Use   Smoking status: Never Smoker   Smokeless tobacco: Never Used  Substance Use Topics   Alcohol use: No    Frequency: Never      Objective:   BP 121/87    Pulse 76    Temp 98.8 F (37.1 C) (Oral)    Resp 18    Wt 292 lb  12.8 oz (132.8 kg)    BMI 50.26 kg/m  Vitals:   09/18/18 0814  BP: 121/87  Pulse: 76  Resp: 18  Temp: 98.8 F (37.1 C)  TempSrc: Oral  Weight: 292 lb 12.8 oz (132.8 kg)     Physical Exam Constitutional:      Appearance: Normal appearance.  Cardiovascular:     Rate and Rhythm: Normal rate and regular rhythm.     Heart sounds: Normal heart sounds.  Pulmonary:     Effort: Pulmonary effort is normal.     Breath sounds: Normal breath sounds.  Skin:    General: Skin is warm and dry.  Neurological:     Mental Status: She is alert and oriented to person, place, and time. Mental status is at baseline.  Psychiatric:        Mood and Affect: Mood normal.        Behavior: Behavior normal.         Assessment & Plan    1. Morbid obesity (HCC)  A1c has decreased, history of prediabetes. Will start at 0.6 mg subQ daily. If not accepted by insurance, will do topamax. Counseled on side effects of this injection. She is using Nexplanon for birth control.   - Liraglutide -Weight  Management (SAXENDA) 18 MG/3ML SOPN; Inject 0.6 mg into the skin every morning.  Dispense: 3 mL; Refill: 0 - Insulin Pen Needle (NOVOFINE) 32G X 6 MM MISC; Use one needle daily for saxenda injection.  Dispense: 100 each; Refill: 1  2. Prediabetes  - POCT glycosylated hemoglobin (Hb A1C) - Liraglutide -Weight Management (SAXENDA) 18 MG/3ML SOPN; Inject 0.6 mg into the skin every morning.  Dispense: 3 mL; Refill: 0 - Insulin Pen Needle (NOVOFINE) 32G X 6 MM MISC; Use one needle daily for saxenda injection.  Dispense: 100 each; Refill: 1  The entirety of the information documented in the History of Present Illness, Review of Systems and Physical Exam were personally obtained by me. Portions of this information were initially documented by April Miller, CMA and reviewed by me for thoroughness and accuracy.         Trey Sailors, PA-C  [facility]

## 2018-09-18 NOTE — Patient Instructions (Signed)
Obesity, Adult Obesity is having too much body fat. If you have a BMI of 30 or more, you are obese. BMI is a number that explains how much body fat you have. Obesity is often caused by taking in (consuming) more calories than your body uses. Obesity can cause serious health problems. Changing your lifestyle can help to treat obesity. Follow these instructions at home: Eating and drinking   Follow advice from your doctor about what to eat and drink. Your doctor may tell you to: ? Cut down on (limit) fast foods, sweets, and processed snack foods. ? Choose low-fat options. For example, choose low-fat milk instead of whole milk. ? Eat 5 or more servings of fruits or vegetables every day. ? Eat at home more often. This gives you more control over what you eat. ? Choose healthy foods when you eat out. ? Learn what a healthy portion size is. A portion size is the amount of a certain food that is healthy for you to eat at one time. This is different for each person. ? Keep low-fat snacks available. ? Avoid sugary drinks. These include soda, fruit juice, iced tea that is sweetened with sugar, and flavored milk. ? Eat a healthy breakfast.  Drink enough water to keep your pee (urine) clear or pale yellow.  Do not go without eating for long periods of time (do not fast).  Do not go on popular or trendy diets (fad diets). Physical Activity  Exercise often, as told by your doctor. Ask your doctor: ? What types of exercise are safe for you. ? How often you should exercise.  Warm up and stretch before being active.  Do slow stretching after being active (cool down).  Rest between times of being active. Lifestyle  Limit how much time you spend in front of your TV, computer, or video game system (be less sedentary).  Find ways to reward yourself that do not involve food.  Limit alcohol intake to no more than 1 drink a day for nonpregnant women and 2 drinks a day for men. One drink equals 12 oz  of beer, 5 oz of wine, or 1 oz of hard liquor. General instructions  Keep a weight loss journal. This can help you keep track of: ? The food that you eat. ? The exercise that you do.  Take over-the-counter and prescription medicines only as told by your doctor.  Take vitamins and supplements only as told by your doctor.  Think about joining a support group. Your doctor may be able to help with this.  Keep all follow-up visits as told by your doctor. This is important. Contact a doctor if:  You cannot meet your weight loss goal after you have changed your diet and lifestyle for 6 weeks. This information is not intended to replace advice given to you by your health care provider. Make sure you discuss any questions you have with your health care provider. Document Released: 09/11/2011 Document Revised: 11/25/2015 Document Reviewed: 04/07/2015 Elsevier Interactive Patient Education  2019 Elsevier Inc.  

## 2018-09-24 ENCOUNTER — Telehealth: Payer: Self-pay

## 2018-09-24 NOTE — Telephone Encounter (Signed)
LM that Bernie Covey was DENIED.

## 2018-09-24 NOTE — Telephone Encounter (Signed)
Patient wants to try the Topomax to [employer]

## 2018-09-25 MED ORDER — TOPIRAMATE 25 MG PO TABS: ORAL_TABLET | ORAL | 0 refills | Status: DC

## 2018-09-25 NOTE — Telephone Encounter (Signed)
When I spoke to patient yesterday she stated that you discussed the Topomax with her.

## 2018-09-25 NOTE — Telephone Encounter (Signed)
Yes I received denial from insurance, looks like it requires failure of Orlistat. Will send in topamax with directions on how to take. Can cause numbness and tingling in hands and feet as well as drowsiness.

## 2018-11-05 ENCOUNTER — Ambulatory Visit (INDEPENDENT_AMBULATORY_CARE_PROVIDER_SITE_OTHER): Payer: BLUE CROSS/BLUE SHIELD | Admitting: Physician Assistant

## 2018-11-05 MED ORDER — ORLISTAT 120 MG PO CAPS: 120.0000 mg | ORAL_CAPSULE | Freq: Three times a day (TID) | ORAL | 0 refills | Status: DC

## 2018-11-05 NOTE — Patient Instructions (Signed)
Obesity, Adult Obesity is having too much body fat. If you have a BMI of 30 or more, you are obese. BMI is a number that explains how much body fat you have. Obesity is often caused by taking in (consuming) more calories than your body uses. Obesity can cause serious health problems. Changing your lifestyle can help to treat obesity. Follow these instructions at home: Eating and drinking   Follow advice from your doctor about what to eat and drink. Your doctor may tell you to: ? Cut down on (limit) fast foods, sweets, and processed snack foods. ? Choose low-fat options. For example, choose low-fat milk instead of whole milk. ? Eat 5 or more servings of fruits or vegetables every day. ? Eat at home more often. This gives you more control over what you eat. ? Choose healthy foods when you eat out. ? Learn what a healthy portion size is. A portion size is the amount of a certain food that is healthy for you to eat at one time. This is different for each person. ? Keep low-fat snacks available. ? Avoid sugary drinks. These include soda, fruit juice, iced tea that is sweetened with sugar, and flavored milk. ? Eat a healthy breakfast.  Drink enough water to keep your pee (urine) clear or pale yellow.  Do not go without eating for long periods of time (do not fast).  Do not go on popular or trendy diets (fad diets). Physical Activity  Exercise often, as told by your doctor. Ask your doctor: ? What types of exercise are safe for you. ? How often you should exercise.  Warm up and stretch before being active.  Do slow stretching after being active (cool down).  Rest between times of being active. Lifestyle  Limit how much time you spend in front of your TV, computer, or video game system (be less sedentary).  Find ways to reward yourself that do not involve food.  Limit alcohol intake to no more than 1 drink a day for nonpregnant women and 2 drinks a day for men. One drink equals 12 oz  of beer, 5 oz of wine, or 1 oz of hard liquor. General instructions  Keep a weight loss journal. This can help you keep track of: ? The food that you eat. ? The exercise that you do.  Take over-the-counter and prescription medicines only as told by your doctor.  Take vitamins and supplements only as told by your doctor.  Think about joining a support group. Your doctor may be able to help with this.  Keep all follow-up visits as told by your doctor. This is important. Contact a doctor if:  You cannot meet your weight loss goal after you have changed your diet and lifestyle for 6 weeks. This information is not intended to replace advice given to you by your health care provider. Make sure you discuss any questions you have with your health care provider. Document Released: 09/11/2011 Document Revised: 11/25/2015 Document Reviewed: 04/07/2015 Elsevier Interactive Patient Education  2019 Elsevier Inc.  

## 2018-11-05 NOTE — Progress Notes (Signed)
   Subjective:    Patient ID: Danielle Sanchez, female    DOB: 06-25-1986, 33 y.o.   MRN: [id number]  Danielle Sanchez is a 33 y.o. female presenting on 11/05/2018 for Obesity  Virtual Visit via Video Note  I connected with Deangela Bogucki on 11/05/18 at  8:00 AM EDT by a video enabled telemedicine application and verified that I am speaking with the correct person using two identifiers.   I discussed the limitations of evaluation and management by telemedicine and the availability of in person appointments. The patient expressed understanding and agreed to proceed.   Patient location: home Provider location: [facility]/home office  Persons involved in the visit: patient, provider    HPI   Presenting today for follow up of obesity. Last visit patient was started on Topamax 25 mg nightly increasing to 100 mg nightly over four weeks for obesity as Bernie Covey was denied by insurance. Patient did take this and noticed a decrease in her appetite as well as weight loss. Her current weight today is 288 pounds. 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 She wishes to change the medication.  Wt Readings from Last 3 Encounters:  09/18/18 292 lb 12.8 oz (132.8 kg)  06/14/18 289 lb (131.1 kg)  11/04/17 (!) 320 lb 3.2 oz (145.2 kg)   Current weight 288 lbs  Friday June 5th at 8:40 AM for obesity follow up    Social History   Tobacco Use  . Smoking status: Never Smoker  . Smokeless tobacco: Never Used  Substance Use Topics  . Alcohol use: No    Frequency: Never  . Drug use: No    Review of Systems Per HPI unless specifically indicated above     Objective:    There were no vitals taken for this visit.  Wt Readings from Last 3 Encounters:  09/18/18 292 lb 12.8 oz (132.8 kg)  06/14/18 289 lb (131.1 kg)  11/04/17 (!) 320 lb 3.2 oz (145.2 kg)    Physical Exam Constitutional:      Appearance: Normal appearance.  Neurological:     Mental  Status: She is alert.  Psychiatric:        Mood and Affect: Mood normal.        Behavior: Behavior normal.    Results for orders placed or performed in visit on 09/18/18  POCT glycosylated hemoglobin (Hb A1C)  Result Value Ref Range   Hemoglobin A1C 5.4 4.0 - 5.6 %   HbA1c POC (<> result, manual entry)     HbA1c, POC (prediabetic range)     HbA1c, POC (controlled diabetic range)        Assessment & Plan:  1. Morbid obesity (HCC)  Topamax did bring about modest weight loss but resulted in intolerable peripheral neuropathy. Her insurance requires failure of Xenical prior to [place] so will try this next. Patient willing to try this. Counseled on side effects including abdominal pain, diarrhea, steatorrhea, headache. If intolerable or not effective, we will see for e-visit and plan to start Saxenda. Counseled to still continue reduced calorie eating plan and increase exercise.   - orlistat (XENICAL) 120 MG capsule; Take 1 capsule (120 mg total) by mouth 3 (three) times daily with meals for 30 days.  Dispense: 90 capsule; Refill: 0   Follow up plan: Return in about 1 month (around 12/06/2018) for obesity.  Osvaldo Angst, PA-C [facility] 11/05/2018, 9:03 AM

## 2018-11-18 ENCOUNTER — Telehealth: Payer: Self-pay | Admitting: Physician Assistant

## 2018-11-18 NOTE — Telephone Encounter (Signed)
PA for orlistat denied because this medication is apparently available over the counter and she would have to purchase it that way and fail it in order to have it paid for by insurance or to consider saxenda. Looks like she can purchase it at [facility] or [facility] store.

## 2018-11-18 NOTE — Telephone Encounter (Signed)
-----   Message from Barrie Lyme sent at 11/18/2018  1:56 PM EDT ----- Review incoming fax

## 2018-11-19 NOTE — Telephone Encounter (Signed)
Patient advised as directed below. 

## 2018-11-22 ENCOUNTER — Telehealth: Payer: Self-pay | Admitting: Physician Assistant

## 2018-11-22 MED ORDER — ORLISTAT 120 MG PO CAPS: 120.0000 mg | ORAL_CAPSULE | Freq: Three times a day (TID) | ORAL | 0 refills | Status: DC

## 2018-11-22 NOTE — Telephone Encounter (Signed)
[facility] faxed refill request for the following medications:  orlistat (XENICAL) 120 MG capsule    Please advise.

## 2018-12-06 ENCOUNTER — Ambulatory Visit: Payer: Self-pay | Admitting: Physician Assistant

## 2018-12-27 DIAGNOSIS — Z1159 Encounter for screening for other viral diseases: Secondary | ICD-10-CM | POA: Diagnosis not present

## 2019-04-10 ENCOUNTER — Telehealth (INDEPENDENT_AMBULATORY_CARE_PROVIDER_SITE_OTHER): Payer: BC Managed Care – PPO | Admitting: Physician Assistant

## 2019-04-10 ENCOUNTER — Encounter: Payer: Self-pay | Admitting: Physician Assistant

## 2019-04-10 VITALS — Temp 96.9°F | Wt 290.0 lb

## 2019-04-10 DIAGNOSIS — B084 Enteroviral vesicular stomatitis with exanthem: Secondary | ICD-10-CM

## 2019-04-10 NOTE — Patient Instructions (Signed)
Hand, Foot, and Mouth Disease, Adult Hand, foot, and mouth disease is a common viral illness. It happens mainly in children who are younger than 31 years old, but adolescents and adults may also get it. The illness often causes:  Sore throat.  Sores in the mouth.  Fever.  Rash on the hands and feet. Usually, this condition is not serious. Most people get better within 1-2 weeks. What are the causes? This condition is usually caused by a group of viruses called enteroviruses. The disease can spread from person to person (is contagious). A person is most contagious during the first week of the illness. The infection spreads through direct contact with:  Nose discharge of an infected person.  Throat discharge of an infected person.  Stool (feces) of an infected person. What are the signs or symptoms? Symptoms of this condition include:  Small sores in the mouth. These may cause pain.  A rash on the hands and feet, and sometimes on the buttocks. The rash may also occur on the arms, legs, or other areas of the body. The rash may look like small red bumps or sores and may have blisters.  Fever.  Body aches or headaches.  Irritability.  Decreased appetite. How is this diagnosed? This condition can usually be diagnosed with a physical exam in which your health care provider will look at your rash and mouth sores. Tests are usually not needed. In some cases, a stool (feces) sample or a throat swab may be taken to check for the virus or for other infections. How is this treated? In most cases, no treatment is needed. People usually get better within 2 weeks without treatment. To help relieve pain or fever, your health care provider may recommend over-the-counter medicines such as ibuprofen or acetaminophen. To help relieve discomfort from mouth sores, your health care provider may recommend using:  Solutions that are rinsed in the mouth.  Pain-relieving gel that is applied to the sores  (topical gel).  Antacid medicine. Follow these instructions at home: Managing pain and discomfort  Rinse your mouth with a salt-water mixture 3-4 times a day or as needed. To make a salt-water mixture, completely dissolve -1 tsp of salt in 1 cup of warm water. This can help to reduce pain from the mouth sores. Your health care provider may also recommend other rinse solutions to treat mouth sores.  To relieve discomfort when you are eating: ? Try combinations of foods to see what you can tolerate. Aim for a balanced diet. ? Eat soft foods. These may be easier to swallow. ? Avoid foods and drinks that are salty, spicy, or acidic. ? Avoid alcohol. ? Try cold food and drinks, such as water, milk, milkshakes, frozen ice pops, slushies, and sherbets. Low-calorie sport drinks are good choices for staying hydrated. General instructions   Return to your normal activities as told by your health care provider. Ask your health care provider what activities are safe for you.  Take or apply over-the-counter and prescription medicines only as told by your health care provider.  Wash your hands often with soap and water. If soap and water are not available, use hand sanitizer.  Stay away from work, schools, or other group settings during the first few days of the illness, or until your fever is gone.  Keep all follow-up visits as told by your health care provider. This is important. Contact a health care provider if:  Your symptoms get worse or do not improve within 2   weeks.  You have pain that does not get better with medicine.  You feel very irritable.  You have trouble swallowing.  You develop sores or blisters on your lips or outside of your mouth.  You have a fever for more than 3 days. Get help right away if:  You develop signs of severe dehydration, such as: ? Decreased urination. This means urinating only very small amounts or urinating fewer than 3 times in a 24-hour period. ?  Urine that is very dark. ? Dry mouth, tongue, or lips. ? Decreased tears or sunken eyes. ? Dry skin. ? Rapid breathing. ? Decreased activity or being very sleepy. ? Pale skin. ? Fingertips taking longer than 2 seconds to turn pink after a gentle squeeze. ? Weight loss.  You have a severe headache.  You have a stiff neck.  You experience changes in your behavior.  You have chest pain.  You have trouble breathing. Summary  Hand, foot, and mouth disease is a common viral illness.  This disease can spread from person to person (is contagious).  The illness often causes a sore throat, sores in the mouth, fever, and a rash on the hands and feet.  Typically, no treatment is needed for this condition. People usually get better within 2 weeks without treatment.  Get help right away if you develop signs of severe dehydration. This information is not intended to replace advice given to you by your health care provider. Make sure you discuss any questions you have with your health care provider. Document Released: 08/07/2016 Document Revised: 10/10/2018 Document Reviewed: 08/07/2016 Elsevier Patient Education  2020 Elsevier Inc.  

## 2019-04-10 NOTE — Progress Notes (Signed)
Patient: Danielle Sanchez Female    DOB: May 04, 1986   33 y.o.   MRN: [id number] Visit Date: 04/10/2019  Today's Provider: Margaretann Loveless, PA-C   Chief Complaint  Patient presents with  . Rash   Subjective:    I,Joseline E. Rosas,RMA am acting as a Neurosurgeon for [employer], PA-C.  Virtual Visit via Video Note  I connected with Delma Officer on 04/10/19 at  3:20 PM EDT by a video enabled telemedicine application and verified that I am speaking with the correct person using two identifiers.  Location: Patient: home Provider: home office   I discussed the limitations of evaluation and management by telemedicine and the availability of in person appointments. The patient expressed understanding and agreed to proceed.   Rash This is a new problem. The current episode started in the past 7 days (Reports that the first symptoms was the sore throat that started on Sunday and noticd the rash Tuesday night.). The problem is unchanged. The affected locations include the left hand, right hand, right toes, left toes, right fingers, left fingers and neck (palm of her hands and finger and feels sores inside her mouth.). The rash is characterized by itchiness and redness. Associated symptoms include a sore throat. Pertinent negatives include no fatigue or fever. Treatments tried: Tylenol Cold Severe. The treatment provided no relief.   Of note last Thursday her son became ill with fever and was taken to [facility]. It was felt he had an ear infection and possible eye infection. Was started on an antibiotic and developed a "rash and sores". Was felt to be a reaction to the antibiotic and was changed. She reports he is improving. Her rash started on Tuesday on hands and feet. Hands and feet feel like pins and needles. Has sores in her mouth. No fever.   Allergies  Allergen Reactions  . Azithromycin Swelling     Current Outpatient Medications:  .  etonogestrel (NEXPLANON) 68 MG  IMPL implant, Inject into the skin., Disp: , Rfl:   Review of Systems  Constitutional: Negative for chills, fatigue and fever.  HENT: Positive for mouth sores and sore throat.   Respiratory: Negative.   Cardiovascular: Negative.   Gastrointestinal: Negative.   Skin: Positive for rash.    Social History   Tobacco Use  . Smoking status: Never Smoker  . Smokeless tobacco: Never Used  Substance Use Topics  . Alcohol use: No    Frequency: Never      Objective:   Temp (!) 96.9 F (36.1 C) (Oral)   Wt 290 lb (131.5 kg)   BMI 49.78 kg/m  Vitals:   04/10/19 1455  Temp: (!) 96.9 F (36.1 C)  TempSrc: Oral  Weight: 290 lb (131.5 kg)  Body mass index is 49.78 kg/m.   Physical Exam Vitals signs reviewed.  Constitutional:      General: She is not in acute distress.    Appearance: Normal appearance. She is well-developed. She is not ill-appearing.  HENT:     Head: Normocephalic and atraumatic.     Mouth/Throat:     Mouth: Mucous membranes are moist. Oral lesions present.  Neck:     Musculoskeletal: Normal range of motion and neck supple.  Pulmonary:     Effort: Pulmonary effort is normal. No respiratory distress.  Skin:    Findings: Erythema and rash present. Rash is papular (on palms and soles of feet).  Neurological:     Mental Status: She  is alert.  Psychiatric:        Behavior: Behavior normal.        Thought Content: Thought content normal.        Judgment: Judgment normal.      No results found for any visits on 04/10/19.     Assessment & Plan     1. Hand, foot and mouth disease Discussed conservative management for viral illness using tylenol or IBU prn, salt water gargles for sores in mouth, expectant timeline for self resolution. Patient voiced understanding. Discussed highly contagious and need to wash hands frequently and clean surfaces. Her son does go to daycare so she was advised to let the daycare know of the diagnosis. Call if worsening.    I  discussed the assessment and treatment plan with the patient. The patient was provided an opportunity to ask questions and all were answered. The patient agreed with the plan and demonstrated an understanding of the instructions.   The patient was advised to call back or seek an in-person evaluation if the symptoms worsen or if the condition fails to improve as anticipated.  I provided 14 minutes of non-face-to-face time during this encounter.     Mar Daring, PA-C  [facility]

## 2019-11-17 DIAGNOSIS — M79671 Pain in right foot: Secondary | ICD-10-CM | POA: Diagnosis not present

## 2019-11-17 DIAGNOSIS — Q66211 Congenital metatarsus primus varus, right foot: Secondary | ICD-10-CM | POA: Diagnosis not present

## 2019-11-17 DIAGNOSIS — Q66229 Congenital metatarsus adductus, unspecified foot: Secondary | ICD-10-CM | POA: Diagnosis not present

## 2019-11-17 DIAGNOSIS — S93311A Subluxation of tarsal joint of right foot, initial encounter: Secondary | ICD-10-CM | POA: Diagnosis not present

## 2020-02-19 DIAGNOSIS — Z6841 Body Mass Index (BMI) 40.0 and over, adult: Secondary | ICD-10-CM | POA: Diagnosis not present

## 2020-02-19 DIAGNOSIS — Z3046 Encounter for surveillance of implantable subdermal contraceptive: Secondary | ICD-10-CM | POA: Diagnosis not present

## 2020-02-19 DIAGNOSIS — Z309 Encounter for contraceptive management, unspecified: Secondary | ICD-10-CM | POA: Diagnosis not present

## 2020-03-09 ENCOUNTER — Telehealth (INDEPENDENT_AMBULATORY_CARE_PROVIDER_SITE_OTHER): Payer: BC Managed Care – PPO | Admitting: Physician Assistant

## 2020-03-09 DIAGNOSIS — H1032 Unspecified acute conjunctivitis, left eye: Secondary | ICD-10-CM | POA: Diagnosis not present

## 2020-03-09 DIAGNOSIS — J069 Acute upper respiratory infection, unspecified: Secondary | ICD-10-CM

## 2020-03-09 MED ORDER — OFLOXACIN 0.3 % OP SOLN: 1.0000 [drp] | Freq: Four times a day (QID) | OPHTHALMIC | 0 refills | Status: AC

## 2020-03-09 NOTE — Progress Notes (Signed)
MyChart Video Visit    Virtual Visit via Video Note   This visit type was conducted due to national recommendations for restrictions regarding the COVID-19 Pandemic (e.g. social distancing) in an effort to limit this patient's exposure and mitigate transmission in our community. This patient is at least at moderate risk for complications without adequate follow up. This format is felt to be most appropriate for this patient at this time. Physical exam was limited by quality of the video and audio technology used for the visit.   Patient location: Home Provider location: office    I discussed the limitations of evaluation and management by telemedicine and the availability of in person appointments. The patient expressed understanding and agreed to proceed.   Patient: Danielle Sanchez   DOB: 01-19-1986   34 y.o. Female  MRN: [id number] Visit Date: 03/09/2020  Today's healthcare provider: Trey Sailors, PA-C   Chief Complaint  Patient presents with  . Conjunctivitis   Subjective    HPI   Patient presents with redness, swelling and discharge of her left eye worse than her right. This has happened for about several days. She reports her son has similar symptoms. She reports she had crusting of her left eye in the morning. She has gotten tested for COVID this morning and is vaccinated. 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 She denies fevers, chills, SOB. She is having some nasal congestion. She denies loss of vision.     Medications: Outpatient Medications Prior to Visit  Medication Sig  . etonogestrel (NEXPLANON) 68 MG IMPL implant Inject into the skin.   No facility-administered medications prior to visit.    Review of Systems  HENT: Positive for postnasal drip and sinus pain.   Respiratory: Negative for chest tightness and shortness of breath.       Objective    There were no vitals taken for this visit.   Physical Exam Constitutional:      Appearance:  Normal appearance.  Eyes:     Conjunctiva/sclera:     Right eye: Right conjunctiva is injected. No exudate or hemorrhage.    Left eye: Left conjunctiva is injected. No exudate or hemorrhage. Pulmonary:     Effort: Pulmonary effort is normal. No respiratory distress.  Neurological:     Mental Status: She is alert and oriented to person, place, and time. Mental status is at baseline.  Psychiatric:        Mood and Affect: Mood normal.        Behavior: Behavior normal.        Assessment & Plan    1. Acute conjunctivitis of left eye, unspecified acute conjunctivitis type  Advised this is likely viral. Agree with COVID test. If symptoms not improving, can use drops.   - ofloxacin (OCUFLOX) 0.3 % ophthalmic solution; Place 1 drop into both eyes 4 (four) times daily for 7 days.  Dispense: 1.4 mL; Refill: 0  2. Viral URI  Counseled regarding signs and symptoms of viral and bacterial respiratory infections. Advised to call or return for additional evaluation if she develops any sign of bacterial infection, or if current symptoms last longer than 10 days. May mychart message back for antibiotic. Can treat symptomatically for now with mucinex, delsym, sudafed.    No follow-ups on file.     I discussed the assessment and treatment plan with the patient. The patient was provided an opportunity to ask questions and all were answered. The patient agreed with the plan  and demonstrated an understanding of the instructions.   The patient was advised to call back or seek an in-person evaluation if the symptoms worsen or if the condition fails to improve as anticipated.  ITrey Sailors, PA-C, have reviewed all documentation for this visit. The documentation on 03/09/20 for the exam, diagnosis, procedures, and orders are all accurate and complete.   Maryella Shivers [facility] [phone] (phone) [phone] (fax)  [facility]

## 2020-03-19 ENCOUNTER — Encounter: Payer: Self-pay | Admitting: Physician Assistant

## 2020-03-19 NOTE — Telephone Encounter (Signed)
Needs re-evaluation. Virtual or in person OV.

## 2020-06-30 ENCOUNTER — Encounter: Payer: Self-pay | Admitting: Physician Assistant

## 2020-07-01 NOTE — Telephone Encounter (Signed)
CPE. However, she is 19 and unless there is a strong family history she likely wouldn't qualify for a mammogram at this age.

## 2020-07-14 ENCOUNTER — Ambulatory Visit
Admission: EM | Admit: 2020-07-14 | Discharge: 2020-07-14 | Disposition: A | Discharge status: Home / Self Care | Payer: 59 | Attending: Family Medicine | Admitting: Family Medicine

## 2020-07-14 ENCOUNTER — Encounter: Payer: Self-pay | Admitting: Family Medicine

## 2020-07-14 ENCOUNTER — Ambulatory Visit (INDEPENDENT_AMBULATORY_CARE_PROVIDER_SITE_OTHER): Payer: 59

## 2020-07-14 DIAGNOSIS — R0781 Pleurodynia: Secondary | ICD-10-CM

## 2020-07-14 MED ORDER — KETOROLAC TROMETHAMINE 30 MG/ML IJ SOLN
30.0000 mg | Freq: Once | INTRAMUSCULAR | Status: AC
  Administered 2020-07-14: 30 mg via INTRAMUSCULAR

## 2020-07-14 MED ORDER — CYCLOBENZAPRINE HCL 5 MG PO TABS: 5.0000 mg | ORAL_TABLET | Freq: Three times a day (TID) | ORAL | 0 refills | Status: DC | PRN

## 2020-07-14 NOTE — ED Triage Notes (Signed)
Pt presents with sudden onset right sided rib pain after taking a quick breath today. Pt is breathing rapidly and in obvious pain. Dahlia Byes at bedside.

## 2020-07-14 NOTE — Discharge Instructions (Addendum)
Your x ray was normal I believe that this is a muscle strain/spasm or ligament strain.  Use compression You can try ice the area or alternate with heat.  Ibuprofen and tylenol as needed Flexeril as needed for muscle relaxant.  Follow up as needed for continued or worsening symptoms

## 2020-07-15 NOTE — ED Provider Notes (Signed)
Danielle Sanchez    CSN: [id number] Arrival date & time: 07/14/20  1510      History   Chief Complaint Chief Complaint  Patient presents with  . Chest Pain    ribs    HPI Danielle Sanchez is a 35 y.o. female.   Pt is a 35 year old female that presents with rib pain. This started today. Heard and felt a pop in the right rib area. Severe sharp pain. Trouble taking a deep breath. URI with cough prior to this starting. No fever. No falls or injuries.      Past Medical History:  Diagnosis Date  . Allergy   . Anxiety   . Medical history non-contributory   . Pregnancy induced hypertension     Patient Active Problem List   Diagnosis Date Noted  . Prediabetes 06/14/2018  . Morbid obesity (HCC) 06/14/2018  . Gestational hypertension w/o significant proteinuria in 3rd trimester 10/30/2017  . Gestational hypertension 10/25/2017  . Pregnancy 09/24/2017  . Maternal obesity, antepartum 05/28/2017  . Elevated prolactin level 05/28/2017    Past Surgical History:  Procedure Laterality Date  . ANTERIOR CRUCIATE LIGAMENT REPAIR Right   . bone spur Left   . CESAREAN SECTION N/A 11/01/2017   Procedure: CESAREAN SECTION;  Surgeon: Ward, Elenora Fender, MD;  Location: [facility] ORS;  Service: Obstetrics;  Laterality: N/A;  . MENISCUS REPAIR Right   . WISDOM TOOTH EXTRACTION  2017    OB History    Gravida  1   Para  1   Term  1   Preterm      AB      Living  1     SAB      IAB      Ectopic      Multiple  0   Live Births  1            Home Medications    Prior to Admission medications   Medication Sig Start Date End Date Taking? Authorizing Provider  cyclobenzaprine (FLEXERIL) 5 MG tablet Take 1 tablet (5 mg total) by mouth 3 (three) times daily as needed for muscle spasms. 07/14/20  Yes Aniceto Kyser A, NP  etonogestrel (NEXPLANON) 68 MG IMPL implant Inject into the skin.    [provider]    Family History Family History  Problem Relation Age of  Onset  . Diabetes Maternal Grandmother   . Hypertension Maternal Grandmother   . Hypotension Mother     Social History Social History   Tobacco Use  . Smoking status: Never Smoker  . Smokeless tobacco: Never Used  Substance Use Topics  . Alcohol use: No  . Drug use: No     Allergies   Azithromycin   Review of Systems Review of Systems   Physical Exam Triage Vital Signs ED Triage Vitals  Enc Vitals Group     BP 07/14/20 1520 (!) 137/91     Pulse Rate 07/14/20 1520 80     Resp 07/14/20 1520 (!) 28     Temp 07/14/20 1520 98.2 F (36.8 C)     Temp Source 07/14/20 1520 Oral     SpO2 07/14/20 1520 97 %     Weight --      Height --      Head Circumference --      Peak Flow --      Pain Score 07/14/20 1522 9     Pain Loc --      Pain  Edu? --      Excl. in GC? --    No data found.  Updated Vital Signs BP (!) 137/91 (BP Location: Left Arm)   Pulse 80   Temp 98.2 F (36.8 C) (Oral)   Resp (!) 28   SpO2 97%   Visual Acuity Right Eye Distance:   Left Eye Distance:   Bilateral Distance:    Right Eye Near:   Left Eye Near:    Bilateral Near:     Physical Exam Vitals and nursing note reviewed.  Constitutional:      General: She is not in acute distress.    Appearance: Normal appearance. She is not ill-appearing, toxic-appearing or diaphoretic.     Comments: Appears in pain  HENT:     Head: Normocephalic.     Nose: Nose normal.     Mouth/Throat:     Pharynx: Oropharynx is clear.  Eyes:     Conjunctiva/sclera: Conjunctivae normal.  Cardiovascular:     Rate and Rhythm: Normal rate and regular rhythm.  Pulmonary:     Effort: Pulmonary effort is normal.     Breath sounds: Normal breath sounds.     Comments: Shallow breaths.  Musculoskeletal:        General: Normal range of motion.     Cervical back: Normal range of motion.  Skin:    General: Skin is warm and dry.     Findings: No rash.  Neurological:     Mental Status: She is alert.  Psychiatric:         Mood and Affect: Mood normal.      UC Treatments / Results  Labs (all labs ordered are listed, but only abnormal results are displayed) Labs Reviewed - No data to display  EKG   Radiology DG Ribs Unilateral W/Chest Right  Result Date: 07/14/2020 CLINICAL DATA:  Pain and shortness of breath EXAM: RIGHT RIBS AND CHEST - 3+ VIEW COMPARISON:  None. FINDINGS: Frontal chest as well as oblique and cone-down rib images were obtained. The lungs are clear. The heart size and pulmonary vascularity are normal. No adenopathy. No evident pneumothorax or pleural effusion.  No rib fracture. IMPRESSION: No evident rib fracture.  Lungs clear. Electronically Signed   By: Bretta Bang III M.D.   On: 07/14/2020 15:38    Procedures Procedures (including critical care time)  Medications Ordered in UC Medications  ketorolac (TORADOL) 30 MG/ML injection 30 mg (30 mg Intramuscular Given 07/14/20 1603)    Initial Impression / Assessment and Plan / UC Course  I have reviewed the triage vital signs and the nursing notes.  Pertinent labs & imaging results that were available during my care of the patient were reviewed by me and considered in my medical decision making (see chart for details).     Right rib pain No concerns on exam.  Most likely muscle strain/spasm Use compression, rest, ice Toradol given here for pain Flexeril as needed for muscle relaxant.  Follow up as needed for continued or worsening symptoms   Final Clinical Impressions(s) / UC Diagnoses   Final diagnoses:  Rib pain on right side     Discharge Instructions     Your x ray was normal I believe that this is a muscle strain/spasm or ligament strain.  Use compression You can try ice the area or alternate with heat.  Ibuprofen and tylenol as needed Flexeril as needed for muscle relaxant.  Follow up as needed for continued or worsening symptoms  ED Prescriptions    Medication Sig Dispense Auth. Provider    cyclobenzaprine (FLEXERIL) 5 MG tablet Take 1 tablet (5 mg total) by mouth 3 (three) times daily as needed for muscle spasms. 30 tablet Dahlia Byes A, NP     PDMP not reviewed this encounter.   Janace Aris, NP 07/15/20 [phone]

## 2020-07-19 ENCOUNTER — Ambulatory Visit: Payer: BC Managed Care – PPO | Admitting: Physician Assistant

## 2020-07-22 ENCOUNTER — Telehealth: Payer: Self-pay

## 2020-07-22 DIAGNOSIS — G43909 Migraine, unspecified, not intractable, without status migrainosus: Secondary | ICD-10-CM | POA: Insufficient documentation

## 2020-07-22 NOTE — Telephone Encounter (Signed)
Please review. Thanks!  

## 2020-07-22 NOTE — Telephone Encounter (Signed)
Copied from CRM [id number]. Topic: General - Other >> Jul 22, 2020  2:44 PM Mcneil, Ja-Kwan wrote: Reason for CRM: Pt stated she had chest x-ray done through [facility] and she would like the imaging results to be released to her so she can forward them to another provider. Pt requests call back. Cb# [phone]

## 2020-07-22 NOTE — Telephone Encounter (Signed)
I can't release a test another provider ordered. It should be available through her MyChart.

## 2020-07-23 ENCOUNTER — Ambulatory Visit: Payer: BC Managed Care – PPO | Admitting: Physician Assistant

## 2020-07-23 NOTE — Telephone Encounter (Signed)
Patient advised as below.  

## 2020-07-30 ENCOUNTER — Encounter: Payer: Self-pay | Admitting: Physician Assistant

## 2020-07-30 ENCOUNTER — Ambulatory Visit (INDEPENDENT_AMBULATORY_CARE_PROVIDER_SITE_OTHER): Payer: 59 | Admitting: Physician Assistant

## 2020-07-30 ENCOUNTER — Other Ambulatory Visit (HOSPITAL_COMMUNITY)
Admission: RE | Admit: 2020-07-30 | Discharge: 2020-07-30 | Disposition: A | Discharge status: Home / Self Care | Payer: 59 | Source: Ambulatory Visit | Attending: Physician Assistant | Admitting: Physician Assistant

## 2020-07-30 ENCOUNTER — Other Ambulatory Visit: Payer: Self-pay

## 2020-07-30 VITALS — BP 124/78 | HR 77 | Temp 98.3°F | Ht 64.0 in | Wt 322.0 lb

## 2020-07-30 DIAGNOSIS — R7303 Prediabetes: Secondary | ICD-10-CM

## 2020-07-30 DIAGNOSIS — R7989 Other specified abnormal findings of blood chemistry: Secondary | ICD-10-CM

## 2020-07-30 DIAGNOSIS — Z0000 Encounter for general adult medical examination without abnormal findings: Secondary | ICD-10-CM | POA: Diagnosis not present

## 2020-07-30 DIAGNOSIS — N644 Mastodynia: Secondary | ICD-10-CM

## 2020-07-30 DIAGNOSIS — Z124 Encounter for screening for malignant neoplasm of cervix: Secondary | ICD-10-CM

## 2020-07-30 DIAGNOSIS — Z1159 Encounter for screening for other viral diseases: Secondary | ICD-10-CM | POA: Diagnosis not present

## 2020-07-30 DIAGNOSIS — N6452 Nipple discharge: Secondary | ICD-10-CM

## 2020-07-30 NOTE — Progress Notes (Signed)
Complete physical exam   Patient: Danielle Sanchez   DOB: 1986/03/04   34 y.o. Female  MRN: [id number] Visit Date: 07/30/2020  Today's healthcare provider: Trey Sailors, PA-C   Chief Complaint  Patient presents with  . Annual Exam  I,Danielle Sanchez,acting as a scribe for Trey Sailors, PA-C.,have documented all relevant documentation on the behalf of Trey Sailors, PA-C,as directed by  Trey Sailors, PA-C while in the presence of Trey Sailors, PA-C.  Subjective    Danielle Sanchez is a 35 y.o. female who presents today for a complete physical exam.  She reports consuming a general diet. Gym/ health club routine includes low impact aerobics and walking. She generally feels fairly well. She reports sleeping poorly. She does have additional problems to discuss today.  HPI  Patient report right breast pain for a few months. She has been having nipple discharge for several years. She had nipple discharge and elevated prolactin dating back to 07/2017. She was referred to endocrinology for further workup of prolactinoma. However, at that same time she became pregnant and further workup was delayed due to this. She continues to have nipple discharge mainly from the left breast. She also has breast pain in the center of both breasts.   She has had recent labwork as part of bariatric evaluation with [facility]. She intends to undergo duodenal switch.   She also would like to undergo allergy testing.   She is due for a PAP smear.  Past Medical History:  Diagnosis Date  . Allergy   . Anxiety   . Medical history non-contributory   . Pregnancy induced hypertension    Past Surgical History:  Procedure Laterality Date  . ANTERIOR CRUCIATE LIGAMENT REPAIR Right   . bone spur Left   . CESAREAN SECTION N/A 11/01/2017   Procedure: CESAREAN SECTION;  Surgeon: Ward, Elenora Fender, MD;  Location: [facility] ORS;  Service: Obstetrics;  Laterality: N/A;  . MENISCUS REPAIR Right   . WISDOM TOOTH  EXTRACTION  2017   Social History   Socioeconomic History  . Marital status: Married    Spouse name: Not on file  . Number of children: Not on file  . Years of education: Not on file  . Highest education level: Not on file  Occupational History  . Not on file  Tobacco Use  . Smoking status: Never Smoker  . Smokeless tobacco: Never Used  Substance and Sexual Activity  . Alcohol use: No  . Drug use: No  . Sexual activity: Yes    Birth control/protection: Pill  Other Topics Concern  . Not on file  Social History Narrative  . Not on file   Social Determinants of Health   Financial Resource Strain: Not on file  Food Insecurity: Not on file  Transportation Needs: Not on file  Physical Activity: Not on file  Stress: Not on file  Social Connections: Not on file  Intimate Partner Violence: Not on file   Family Status  Relation Name Status  . MGM  (Not Specified)  . Mother  (Not Specified)   Family History  Problem Relation Age of Onset  . Diabetes Maternal Grandmother   . Hypertension Maternal Grandmother   . Hypotension Mother    Allergies  Allergen Reactions  . Azithromycin Swelling    Patient Care Team: Maryella Shivers as PCP - General (Physician Assistant)   Medications: Outpatient Medications Prior to Visit  Medication Sig  . cyclobenzaprine (FLEXERIL)  5 MG tablet Take 1 tablet (5 mg total) by mouth 3 (three) times daily as needed for muscle spasms.  Marland Kitchen etonogestrel (NEXPLANON) 68 MG IMPL implant Inject into the skin.   No facility-administered medications prior to visit.    Review of Systems  Constitutional: Negative.   HENT: Negative.   Eyes: Negative.   Respiratory: Negative.   Cardiovascular: Negative.   Gastrointestinal: Negative.   Endocrine: Negative.   Genitourinary: Negative.   Musculoskeletal: Negative.   Skin: Negative.   Allergic/Immunologic: Negative.   Neurological: Negative.   Hematological: Negative.    Psychiatric/Behavioral: Negative.        Objective    BP 124/78 (BP Location: Left Arm, Patient Position: Sitting, Cuff Size: Large)   Pulse 77   Temp 98.3 F (36.8 C) (Oral)   Ht 5\' 4"  (1.626 m)   Wt (!) 322 lb (146.1 kg)   LMP 07/20/2020 (Exact Date)   SpO2 100%   Breastfeeding No   BMI 55.27 kg/m     Physical Exam Constitutional:      Appearance: Normal appearance. She is obese.  HENT:     Right Ear: Tympanic membrane, ear canal and external ear normal.     Left Ear: Tympanic membrane, ear canal and external ear normal.  Cardiovascular:     Rate and Rhythm: Normal rate and regular rhythm.     Pulses: Normal pulses.     Heart sounds: Normal heart sounds.  Pulmonary:     Effort: Pulmonary effort is normal.     Breath sounds: Normal breath sounds.  Chest:     Chest wall: No mass.  Breasts: Breasts are symmetrical.     Right: No swelling, bleeding or inverted nipple.     Left: Normal. No swelling, bleeding or inverted nipple.    Abdominal:     General: Abdomen is flat. Bowel sounds are normal.     Palpations: Abdomen is soft.  Genitourinary:    Vagina: Normal.     Cervix: Normal.  Skin:    General: Skin is warm and dry.  Neurological:     General: No focal deficit present.     Mental Status: She is alert and oriented to person, place, and time.  Psychiatric:        Mood and Affect: Mood normal.        Behavior: Behavior normal.       Last depression screening scores PHQ 2/9 Scores 07/30/2020 06/14/2018  PHQ - 2 Score 0 0  PHQ- 9 Score 0 5   Last fall risk screening Fall Risk  07/30/2020  Falls in the past year? 1  Number falls in past yr: 0  Injury with Fall? 0  Risk for fall due to : No Fall Risks  Follow up Falls evaluation completed;Education provided;Falls prevention discussed   Last Audit-C alcohol use screening Alcohol Use Disorder Test (AUDIT) 07/30/2020  1. How often do you have a drink containing alcohol? 0  2. How many drinks containing  alcohol do you have on a typical day when you are drinking? 0  3. How often do you have six or more drinks on one occasion? 0  AUDIT-C Score 0   A score of 3 or more in women, and 4 or more in men indicates increased risk for alcohol abuse, EXCEPT if all of the points are from question 1   No results found for any visits on 07/30/20.  Assessment & Plan    Routine Health Maintenance and Physical  Exam  Exercise Activities and Dietary recommendations Goals   None     Immunization History  Administered Date(s) Administered  . Influenza-Unspecified 05/10/2017  . Tdap 08/16/2017    Health Maintenance  Topic Date Due  . Hepatitis C Screening  Never done  . COVID-19 Vaccine (1) Never done  . INFLUENZA VACCINE  02/01/2020  . PAP SMEAR-Modifier  04/12/2020  . TETANUS/TDAP  08/17/2027  . HIV Screening  Completed    Discussed health benefits of physical activity, and encouraged her to engage in regular exercise appropriate for her age and condition.  1. Annual physical exam   2. Need for hepatitis C screening test   3. Nipple discharge  Prolactinoma workup, will recheck prolactin and beta hcg. If elevated will pursue MRI brain.   4. Elevated prolactin level  - Prolactin - Beta HCG, Quant  5. Morbid obesity (HCC)   6. Prediabetes   7. Breast pain  - US BREAST LTD UNI RIGHT INC AXILLA - US BREAST LTD UNI LEFT INC AXILLA - MM DIAG BREAST TOMO BILATERAL; Future  8. Cervical cancer screening  - Cytology - PAP   No follow-ups on file.     ITrey Sailors, PA-C, have reviewed all documentation for this visit. The documentation on 07/30/20 for the exam, diagnosis, procedures, and orders are all accurate and complete.  The entirety of the information documented in the History of Present Illness, Review of Systems and Physical Exam were personally obtained by me. Portions of this information were initially documented by [facility] and reviewed by me for  thoroughness and accuracy.     Maryella Shivers  [facility] [phone] (phone) [phone] (fax)  [facility]

## 2020-07-30 NOTE — Patient Instructions (Signed)
Health Maintenance, Female Adopting a healthy lifestyle and getting preventive care are important in promoting health and wellness. Ask your health care provider about:  The right schedule for you to have regular tests and exams.  Things you can do on your own to prevent diseases and keep yourself healthy. What should I know about diet, weight, and exercise? Eat a healthy diet  Eat a diet that includes plenty of vegetables, fruits, low-fat dairy products, and lean protein.  Do not eat a lot of foods that are high in solid fats, added sugars, or sodium.   Maintain a healthy weight Body mass index (BMI) is used to identify weight problems. It estimates body fat based on height and weight. Your health care provider can help determine your BMI and help you achieve or maintain a healthy weight. Get regular exercise Get regular exercise. This is one of the most important things you can do for your health. Most adults should:  Exercise for at least 150 minutes each week. The exercise should increase your heart rate and make you sweat (moderate-intensity exercise).  Do strengthening exercises at least twice a week. This is in addition to the moderate-intensity exercise.  Spend less time sitting. Even light physical activity can be beneficial. Watch cholesterol and blood lipids Have your blood tested for lipids and cholesterol at 33 years of age, then have this test every 5 years. Have your cholesterol levels checked more often if:  Your lipid or cholesterol levels are high.  You are older than 33 years of age.  You are at high risk for heart disease. What should I know about cancer screening? Depending on your health history and family history, you may need to have cancer screening at various ages. This may include screening for:  Breast cancer.  Cervical cancer.  Colorectal cancer.  Skin cancer.  Lung cancer. What should I know about heart disease, diabetes, and high blood  pressure? Blood pressure and heart disease  High blood pressure causes heart disease and increases the risk of stroke. This is more likely to develop in people who have high blood pressure readings, are of African descent, or are overweight.  Have your blood pressure checked: ? Every 3-5 years if you are 18-39 years of age. ? Every year if you are 40 years old or older. Diabetes Have regular diabetes screenings. This checks your fasting blood sugar level. Have the screening done:  Once every three years after age 40 if you are at a normal weight and have a low risk for diabetes.  More often and at a younger age if you are overweight or have a high risk for diabetes. What should I know about preventing infection? Hepatitis B If you have a higher risk for hepatitis B, you should be screened for this virus. Talk with your health care provider to find out if you are at risk for hepatitis B infection. Hepatitis C Testing is recommended for:  Everyone born from 1945 through 1965.  Anyone with known risk factors for hepatitis C. Sexually transmitted infections (STIs)  Get screened for STIs, including gonorrhea and chlamydia, if: ? You are sexually active and are younger than 33 years of age. ? You are older than 33 years of age and your health care provider tells you that you are at risk for this type of infection. ? Your sexual activity has changed since you were last screened, and you are at increased risk for chlamydia or gonorrhea. Ask your health care provider   if you are at risk.  Ask your health care provider about whether you are at high risk for HIV. Your health care provider may recommend a prescription medicine to help prevent HIV infection. If you choose to take medicine to prevent HIV, you should first get tested for HIV. You should then be tested every 3 months for as long as you are taking the medicine. Pregnancy  If you are about to stop having your period (premenopausal) and  you may become pregnant, seek counseling before you get pregnant.  Take 400 to 800 micrograms (mcg) of folic acid every day if you become pregnant.  Ask for birth control (contraception) if you want to prevent pregnancy. Osteoporosis and menopause Osteoporosis is a disease in which the bones lose minerals and strength with aging. This can result in bone fractures. If you are 65 years old or older, or if you are at risk for osteoporosis and fractures, ask your health care provider if you should:  Be screened for bone loss.  Take a calcium or vitamin D supplement to lower your risk of fractures.  Be given hormone replacement therapy (HRT) to treat symptoms of menopause. Follow these instructions at home: Lifestyle  Do not use any products that contain nicotine or tobacco, such as cigarettes, e-cigarettes, and chewing tobacco. If you need help quitting, ask your health care provider.  Do not use street drugs.  Do not share needles.  Ask your health care provider for help if you need support or information about quitting drugs. Alcohol use  Do not drink alcohol if: ? Your health care provider tells you not to drink. ? You are pregnant, may be pregnant, or are planning to become pregnant.  If you drink alcohol: ? Limit how much you use to 0-1 drink a day. ? Limit intake if you are breastfeeding.  Be aware of how much alcohol is in your drink. In the [place] one drink equals one 12 oz bottle of beer (355 mL), one 5 oz glass of wine (148 mL), or one 1 oz glass of hard liquor (44 mL). General instructions  Schedule regular health, dental, and eye exams.  Stay current with your vaccines.  Tell your health care provider if: ? You often feel depressed. ? You have ever been abused or do not feel safe at home. Summary  Adopting a healthy lifestyle and getting preventive care are important in promoting health and wellness.  Follow your health care provider's instructions about healthy  diet, exercising, and getting tested or screened for diseases.  Follow your health care provider's instructions on monitoring your cholesterol and blood pressure. This information is not intended to replace advice given to you by your health care provider. Make sure you discuss any questions you have with your health care provider. Document Revised: 06/12/2018 Document Reviewed: 06/12/2018 Elsevier Patient Education  2021 Elsevier Inc.  

## 2020-07-31 LAB — PROLACTIN: Prolactin: 18.2 ng/mL (ref 4.8–23.3)

## 2020-07-31 LAB — BETA HCG QUANT (REF LAB): hCG Quant: <1 m[IU]/mL

## 2020-08-03 LAB — CYTOLOGY - PAP
Adequacy: ABSENT
Comment: NEGATIVE
Diagnosis: NEGATIVE
High risk HPV: NEGATIVE

## 2020-08-05 ENCOUNTER — Other Ambulatory Visit: Payer: Self-pay

## 2020-08-05 ENCOUNTER — Ambulatory Visit
Admission: RE | Admit: 2020-08-05 | Discharge: 2020-08-05 | Disposition: A | Discharge status: Home / Self Care | Payer: 59 | Source: Ambulatory Visit | Attending: Physician Assistant | Admitting: Physician Assistant

## 2020-08-05 ENCOUNTER — Ambulatory Visit: Admission: RE | Admit: 2020-08-05 | Payer: 59 | Source: Ambulatory Visit

## 2020-08-05 DIAGNOSIS — N644 Mastodynia: Secondary | ICD-10-CM | POA: Insufficient documentation

## 2020-10-31 HISTORY — PX: LAPAROSCOPIC GASTRIC RESTRICTIVE DUODENAL PROCEDURE (DUODENAL SWITCH): SHX6667

## 2020-11-17 DIAGNOSIS — Z9884 Bariatric surgery status: Secondary | ICD-10-CM | POA: Insufficient documentation

## 2021-02-03 ENCOUNTER — Encounter
Admission: RE | Disposition: A | Discharge status: Home / Self Care | Payer: Self-pay | Source: Home / Self Care | Attending: Obstetrics and Gynecology

## 2021-02-03 ENCOUNTER — Ambulatory Visit: Payer: 59 | Admitting: Certified Registered Nurse Anesthetist

## 2021-02-03 ENCOUNTER — Other Ambulatory Visit: Payer: Self-pay

## 2021-02-03 ENCOUNTER — Ambulatory Visit
Admission: RE | Admit: 2021-02-03 | Discharge: 2021-02-03 | Disposition: A | Discharge status: Home / Self Care | Payer: 59 | Attending: Obstetrics and Gynecology | Admitting: Obstetrics and Gynecology

## 2021-02-03 ENCOUNTER — Encounter: Payer: Self-pay | Admitting: Obstetrics and Gynecology

## 2021-02-03 DIAGNOSIS — O039 Complete or unspecified spontaneous abortion without complication: Secondary | ICD-10-CM

## 2021-02-03 DIAGNOSIS — O021 Missed abortion: Secondary | ICD-10-CM | POA: Insufficient documentation

## 2021-02-03 HISTORY — DX: Complete or unspecified spontaneous abortion without complication: O03.9

## 2021-02-03 HISTORY — PX: DILATION AND CURETTAGE OF UTERUS: SHX78

## 2021-02-03 LAB — TYPE AND SCREEN
ABO/RH(D): O NEG
Antibody Screen: NEGATIVE

## 2021-02-03 LAB — COMPREHENSIVE METABOLIC PANEL
ALT: 28 U/L (ref 0–44)
AST: 21 U/L (ref 15–41)
Albumin: 3.8 g/dL (ref 3.5–5.0)
Alkaline Phosphatase: 58 U/L (ref 38–126)
Anion gap: 7 (ref 5–15)
BUN: 6 mg/dL (ref 6–20)
CO2: 25 mmol/L (ref 22–32)
Calcium: 9.2 mg/dL (ref 8.9–10.3)
Chloride: 108 mmol/L (ref 98–111)
Creatinine, Ser: 0.74 mg/dL (ref 0.44–1.00)
GFR, Estimated: >60 mL/min (ref >60)
Glucose, Bld: 93 mg/dL (ref 70–99)
Potassium: 3.2 mmol/L — ABNORMAL LOW (ref 3.5–5.1)
Sodium: 140 mmol/L (ref 135–145)
Total Bilirubin: 0.6 mg/dL (ref 0.3–1.2)
Total Protein: 6.7 g/dL (ref 6.5–8.1)

## 2021-02-03 LAB — CBC
HCT: 42.1 % (ref 36.0–46.0)
Hemoglobin: 13.9 g/dL (ref 12.0–15.0)
MCH: 28.8 pg (ref 26.0–34.0)
MCHC: 33 g/dL (ref 30.0–36.0)
MCV: 87.2 fL (ref 80.0–100.0)
Platelets: 125 10*3/uL — ABNORMAL LOW (ref 150–400)
RBC: 4.83 MIL/uL (ref 3.87–5.11)
RDW: 14.5 % (ref 11.5–15.5)
WBC: 8.1 10*3/uL (ref 4.0–10.5)
nRBC: 0 % (ref 0.0–0.2)

## 2021-02-03 SURGERY — DILATION AND CURETTAGE
Anesthesia: General

## 2021-02-03 MED ORDER — DIPHENHYDRAMINE HCL 50 MG/ML IJ SOLN
INTRAMUSCULAR | Status: AC
  Filled 2021-02-03: qty 1

## 2021-02-03 MED ORDER — SUCCINYLCHOLINE CHLORIDE 200 MG/10ML IV SOSY
PREFILLED_SYRINGE | INTRAVENOUS | Status: DC | PRN
  Administered 2021-02-03: 120 mg via INTRAVENOUS

## 2021-02-03 MED ORDER — ORAL CARE MOUTH RINSE: 15.0000 mL | Freq: Once | OROMUCOSAL | Status: AC

## 2021-02-03 MED ORDER — ACETAMINOPHEN 10 MG/ML IV SOLN
INTRAVENOUS | Status: AC
  Filled 2021-02-03: qty 100

## 2021-02-03 MED ORDER — FENTANYL CITRATE (PF) 100 MCG/2ML IJ SOLN
INTRAMUSCULAR | Status: DC | PRN
  Administered 2021-02-03: 50 ug via INTRAVENOUS

## 2021-02-03 MED ORDER — LIDOCAINE HCL (CARDIAC) PF 100 MG/5ML IV SOSY
PREFILLED_SYRINGE | INTRAVENOUS | Status: DC | PRN
  Administered 2021-02-03: 100 mg via INTRAVENOUS

## 2021-02-03 MED ORDER — CHLORHEXIDINE GLUCONATE 0.12 % MT SOLN
OROMUCOSAL | Status: AC
  Administered 2021-02-03: 15 mL via OROMUCOSAL
  Filled 2021-02-03: qty 15

## 2021-02-03 MED ORDER — CHLORHEXIDINE GLUCONATE 0.12 % MT SOLN: 15.0000 mL | Freq: Once | OROMUCOSAL | Status: AC

## 2021-02-03 MED ORDER — PROPOFOL 10 MG/ML IV BOLUS
INTRAVENOUS | Status: DC | PRN
  Administered 2021-02-03: 150 mg via INTRAVENOUS

## 2021-02-03 MED ORDER — FENTANYL CITRATE (PF) 100 MCG/2ML IJ SOLN
25.0000 ug | INTRAMUSCULAR | Status: DC | PRN
  Administered 2021-02-03: 25 ug via INTRAVENOUS

## 2021-02-03 MED ORDER — FAMOTIDINE 20 MG PO TABS
ORAL_TABLET | ORAL | Status: AC
  Administered 2021-02-03: 20 mg via ORAL
  Filled 2021-02-03: qty 1

## 2021-02-03 MED ORDER — LACTATED RINGERS IV SOLN: INTRAVENOUS | Status: DC

## 2021-02-03 MED ORDER — FENTANYL CITRATE (PF) 100 MCG/2ML IJ SOLN
INTRAMUSCULAR | Status: AC
  Filled 2021-02-03: qty 2

## 2021-02-03 MED ORDER — PHENYLEPHRINE HCL (PRESSORS) 10 MG/ML IV SOLN
INTRAVENOUS | Status: AC
  Filled 2021-02-03: qty 1

## 2021-02-03 MED ORDER — OXYCODONE HCL 5 MG/5ML PO SOLN: 5.0000 mg | Freq: Once | ORAL | Status: AC | PRN

## 2021-02-03 MED ORDER — ONDANSETRON HCL 4 MG/2ML IJ SOLN
INTRAMUSCULAR | Status: DC | PRN
  Administered 2021-02-03: 4 mg via INTRAVENOUS

## 2021-02-03 MED ORDER — FAMOTIDINE 20 MG PO TABS: 20.0000 mg | ORAL_TABLET | Freq: Once | ORAL | Status: AC

## 2021-02-03 MED ORDER — SODIUM CHLORIDE 0.9 % IV SOLN: INTRAVENOUS | Status: DC

## 2021-02-03 MED ORDER — LIDOCAINE HCL (PF) 2 % IJ SOLN
INTRAMUSCULAR | Status: AC
  Filled 2021-02-03: qty 5

## 2021-02-03 MED ORDER — DEXAMETHASONE SODIUM PHOSPHATE 10 MG/ML IJ SOLN
INTRAMUSCULAR | Status: AC
  Filled 2021-02-03: qty 1

## 2021-02-03 MED ORDER — PROPOFOL 10 MG/ML IV BOLUS
INTRAVENOUS | Status: AC
  Filled 2021-02-03: qty 20

## 2021-02-03 MED ORDER — ONDANSETRON HCL 4 MG/2ML IJ SOLN
INTRAMUSCULAR | Status: AC
  Filled 2021-02-03: qty 2

## 2021-02-03 MED ORDER — MIDAZOLAM HCL 2 MG/2ML IJ SOLN
INTRAMUSCULAR | Status: DC | PRN
  Administered 2021-02-03: 2 mg via INTRAVENOUS

## 2021-02-03 MED ORDER — DIPHENHYDRAMINE HCL 50 MG/ML IJ SOLN
INTRAMUSCULAR | Status: DC | PRN
  Administered 2021-02-03: 12.5 mg via INTRAVENOUS

## 2021-02-03 MED ORDER — OXYCODONE HCL 5 MG PO TABS
5.0000 mg | ORAL_TABLET | Freq: Once | ORAL | Status: AC | PRN
  Administered 2021-02-03: 5 mg via ORAL

## 2021-02-03 MED ORDER — MIDAZOLAM HCL 2 MG/2ML IJ SOLN
INTRAMUSCULAR | Status: AC
  Filled 2021-02-03: qty 2

## 2021-02-03 MED ORDER — ONDANSETRON HCL 4 MG/2ML IJ SOLN
4.0000 mg | Freq: Once | INTRAMUSCULAR | Status: AC | PRN
  Administered 2021-02-03: 4 mg via INTRAVENOUS

## 2021-02-03 MED ORDER — OXYCODONE HCL 5 MG PO TABS
ORAL_TABLET | ORAL | Status: AC
  Filled 2021-02-03: qty 1

## 2021-02-03 MED ORDER — ACETAMINOPHEN 10 MG/ML IV SOLN
INTRAVENOUS | Status: DC | PRN
  Administered 2021-02-03: 1000 mg via INTRAVENOUS

## 2021-02-03 MED ORDER — APREPITANT 40 MG PO CAPS
ORAL_CAPSULE | ORAL | Status: AC
  Administered 2021-02-03: 40 mg via ORAL
  Filled 2021-02-03: qty 1

## 2021-02-03 MED ORDER — PROPOFOL 500 MG/50ML IV EMUL
INTRAVENOUS | Status: AC
  Filled 2021-02-03: qty 50

## 2021-02-03 MED ORDER — PROPOFOL 500 MG/50ML IV EMUL
INTRAVENOUS | Status: DC | PRN
  Administered 2021-02-03: 150 ug/kg/min via INTRAVENOUS

## 2021-02-03 MED ORDER — DEXAMETHASONE SODIUM PHOSPHATE 10 MG/ML IJ SOLN
INTRAMUSCULAR | Status: DC | PRN
  Administered 2021-02-03: 10 mg via INTRAVENOUS

## 2021-02-03 MED ORDER — EPHEDRINE SULFATE 50 MG/ML IJ SOLN
INTRAMUSCULAR | Status: DC | PRN
Start: 2021-02-03 — End: 2021-02-03
  Administered 2021-02-03 (×2): 5 mg via INTRAVENOUS

## 2021-02-03 MED ORDER — APREPITANT 40 MG PO CAPS: 40.0000 mg | ORAL_CAPSULE | Freq: Once | ORAL | Status: AC

## 2021-02-03 MED ORDER — POVIDONE-IODINE 10 % EX SWAB: 2.0000 "application " | Freq: Once | CUTANEOUS | Status: DC

## 2021-02-03 SURGICAL SUPPLY — 16 items
BACTOSHIELD CHG 4% 4OZ (MISCELLANEOUS) ×1
CATH ROBINSON RED A/P 16FR (CATHETERS) ×2 IMPLANT
GAUZE 4X4 16PLY ~~LOC~~+RFID DBL (SPONGE) ×2 IMPLANT
GLOVE SURG ENC MOIS LTX SZ7 (GLOVE) ×6 IMPLANT
GLOVE SURG UNDER LTX SZ7.5 (GLOVE) ×6 IMPLANT
GOWN STRL REUS W/ TWL LRG LVL3 (GOWN DISPOSABLE) ×2 IMPLANT
GOWN STRL REUS W/TWL LRG LVL3 (GOWN DISPOSABLE) ×4
KIT TURNOVER CYSTO (KITS) ×2 IMPLANT
MANIFOLD NEPTUNE II (INSTRUMENTS) ×2 IMPLANT
PACK DNC HYST (MISCELLANEOUS) ×2 IMPLANT
PAD OB MATERNITY 4.3X12.25 (PERSONAL CARE ITEMS) ×2 IMPLANT
PAD PREP 24X41 OB/GYN DISP (PERSONAL CARE ITEMS) ×2 IMPLANT
SCRUB CHG 4% DYNA-HEX 4OZ (MISCELLANEOUS) ×1 IMPLANT
SOL PREP PVP 2OZ (MISCELLANEOUS) ×2
SOLUTION PREP PVP 2OZ (MISCELLANEOUS) ×1 IMPLANT
VACURETTE 8MM F TIP (MISCELLANEOUS) ×2 IMPLANT

## 2021-02-03 NOTE — Transfer of Care (Signed)
Immediate Anesthesia Transfer of Care Note  Patient: Danielle Sanchez  Procedure(s) Performed: DILATATION AND CURETTAGE  Patient Location: PACU  Anesthesia Type:General  Level of Consciousness: awake and drowsy  Airway & Oxygen Therapy: Patient Spontanous Breathing and Patient connected to face mask oxygen  Post-op Assessment: Report given to RN and Post -op Vital signs reviewed and stable  Post vital signs: Reviewed and stable  Last Vitals:  Vitals Value Taken Time  BP 94/81 02/03/21 0818  Temp    Pulse 85 02/03/21 0823  Resp 10 02/03/21 0823  SpO2 99 % 02/03/21 0823  Vitals shown include unvalidated device data.  Last Pain:  Vitals:   02/03/21 0639  TempSrc: Oral  PainSc: 0-No pain         Complications: No notable events documented.

## 2021-02-03 NOTE — H&P (Signed)
  Ms. Jagodzinski is a 35 y.o. female G2P1011 here for Pre Op Consulting   Pt story: MAB  Imaging: TVUS 01/12/21 [redacted]w[redacted]d no FHT, just sac TVUS 01/28/21 7w sac without FHT, CRL 0.9cm  Labs: Blood type: Rh neg: rhogam yesterday in the office Beta quant: 79k   Exam:   BP 105/70   Ht 162.6 cm (5\' 4" )   Wt (!) 117.5 kg (259 lb)   LMP 11/09/2020   Breastfeeding No   BMI 44.46 kg/m   Constitutional:  General appearance: Well nourished, well developed female in no acute distress.  Neuro/psych:  Normal mood and affect. No gross motor deficits. Neck:  Supple, normal appearance.  Respiratory:  Normal respiratory effort, no use of accessory muscles Skin:  No visible rashes or external lesions CV: RRR Pulm: CTAB   Pelvic: deferred  Impression:  Missed abortion   Assessment / Plan:   SABRYNA LAHM is a 35 y.o. G2P1011 who presents with not viable IUP  Spontaneous miscarriage: Causes of spontaneous miscarriage discussed with patient, including prevalence, common causes, and the expectation that this event does not increase the chance that she will miscarry again in the future. Emotional support given.  Management options discussed, including: Expectant, medical and surgical.  Pt has opted for: suction D&C  Consents signed today. Risks of surgery were discussed with the patient including but not limited to: bleeding which may require transfusion; infection which may require antibiotics; injury to uterus or surrounding organs; intrauterine scarring which may impair future fertility; need for additional procedures including laparotomy or laparoscopy; and other postoperative/anesthesia complications. Written informed consent was obtained.  This is a scheduled same-day surgery. She will have a postop visit in 2 weeks to review operative findings and pathology.

## 2021-02-03 NOTE — Anesthesia Procedure Notes (Signed)
Procedure Name: Intubation Date/Time: 02/03/2021 7:40 AM Performed by: Tollie Eth, CRNA Pre-anesthesia Checklist: Patient identified, Patient being monitored, Timeout performed, Emergency Drugs available and Suction available Patient Re-evaluated:Patient Re-evaluated prior to induction Oxygen Delivery Method: Circle system utilized Preoxygenation: Pre-oxygenation with 100% oxygen Induction Type: IV induction Ventilation: Mask ventilation without difficulty Laryngoscope Size: Mac and 3 Grade View: Grade I Tube type: Oral Tube size: 7.0 mm Number of attempts: 1 Airway Equipment and Method: Stylet and Video-laryngoscopy Placement Confirmation: ETT inserted through vocal cords under direct vision, positive ETCO2 and breath sounds checked- equal and bilateral Secured at: 21 cm Tube secured with: Tape Dental Injury: Teeth and Oropharynx as per pre-operative assessment

## 2021-02-03 NOTE — Anesthesia Postprocedure Evaluation (Signed)
Anesthesia Post Note  Patient: Danielle Sanchez  Procedure(s) Performed: DILATATION AND CURETTAGE  Patient location during evaluation: PACU Anesthesia Type: General Level of consciousness: awake and alert Pain management: pain level controlled Vital Signs Assessment: post-procedure vital signs reviewed and stable Respiratory status: spontaneous breathing, nonlabored ventilation, respiratory function stable and patient connected to nasal cannula oxygen Cardiovascular status: blood pressure returned to baseline and stable Postop Assessment: no apparent nausea or vomiting Anesthetic complications: no   No notable events documented.   Last Vitals:  Vitals:   02/03/21 0846 02/03/21 0858  BP:  112/60  Pulse: 69 60  Resp: 16 (!) 22  Temp:  (!) 36.4 C  SpO2: 98% 97%    Last Pain:  Vitals:   02/03/21 0858  TempSrc: Temporal  PainSc: 0-No pain                 Corinda Gubler

## 2021-02-03 NOTE — Discharge Instructions (Addendum)
Discharge instructions after a Dilation and Curettage  Signs and Symptoms to Report  Call our office at [phone] if you have any of the following:    Fever over 100.4 degrees or higher  Severe stomach pain not relieved with pain medications  Bright red bleeding that's heavier than a period that does not slow with rest after the first 24 hours  To go the bathroom a lot (frequency), you can't hold your urine (urgency), or it hurts when you empty your bladder (urinate)  Chest pain  Shortness of breath  Pain in the calves of your legs  Severe nausea and vomiting not relieved with anti-nausea medications  Any concerns  What You Can Expect after Surgery  You may see some pink tinged, bloody fluid. This is normal. You may also have cramping for several days.   Activities after Your Discharge Follow these guidelines to help speed your recovery at home:  Don't drive if you are in pain or taking narcotic pain medicine. You may drive when you can safely slam on the brakes, turn the wheel forcefully, and rotate your torso comfortably. This is typically 4-7 days. Practice in a parking lot or side street prior to attempting to drive regularly.   Ask others to help with household chores until you feel up to doing tasks.  Don't do strenuous activities, exercises, or sports like vacuuming, tennis, squash, etc. until your doctor says it is safe to do so.  Walk as you feel able. Rest often since it may take a week or two for your energy level to return to normal.   You may climb stairs  Avoid constipation:   -Eat fruits, vegetables, and whole grains. Eat small meals as your appetite will take time to return to normal.   -Drink 6 to 8 glasses of water each day unless your doctor has told you to limit your fluids.   -Use a laxative or stool softener as needed if constipation becomes a problem. You may take Miralax, metamucil, Citrucil, Colace, Senekot, FiberCon, etc. If this does not relieve the  constipation, try two tablespoons of Milk Of Magnesia every 8 hours until your bowels move.   You may shower.   Do not get in a hot tub, swimming pool, etc. until your doctor agrees.  Do not douche, use tampons, or have sex until you stop spotting, usually about 2 weeks.  Take your pain medicine when you need it. The medicine may not work as well if the pain is bad.  Take the medicines you were taking before surgery. Other medications you might need are pain medications (ibuprofen), medications for constipation (Colace) and nausea medications (Zofran).       Coping with Pregnancy Loss Pregnancy loss can happen any time during a pregnancy. Often the cause is not known. It is rarely because of anything you did. Pregnancy loss in early pregnancy (during the first trimester) is called a miscarriage. This type of pregnancy loss is the most common.   Any pregnancy loss can be devastating. You will need to recover both physically and emotionally. Most women are able to get pregnant again after a pregnancy loss and deliver a healthy baby.  How to manage emotional recovery  Pregnancy loss is very hard emotionally. You may feel many different emotions while you grieve. You may feel sad and angry. You may also feel guilty. It is normal to have periods of crying. Emotional recovery can take longer than physical recovery. It is different for everyone.  Some women may feel back to normal quickly and others take longer. Taking these steps can help you cope: Remember that it is unlikely you did anything to cause the pregnancy loss. Share your thoughts and feelings with friends, family, and your partner. Remember that your partner is also recovering emotionally. Make sure you have a good support system, and do not spend too much time alone. Meet with a pregnancy loss counselor or join a pregnancy loss support group. Get enough sleep and eat a healthy diet. Return to regular exercise when you have recovered  physically. Do not use drugs or alcohol to manage your emotions. Consider seeing a mental health professional to help you recover emotionally. Ask a friend or loved one to help you decide what to do with any clothing and nursery items you received for your baby.  How to recognize emotional stress It is normal to have emotional stress after a pregnancy loss. But emotional stress that lasts a long time or becomes severe requires treatment. Watch out for these signs of severe emotional stress: Sadness, anger, or guilt that is not going away and is interfering with your normal activities. Relationship problems that have occurred or gotten worse since the pregnancy loss. Signs of depression that last longer than 2 weeks. These may include: Sadness. Anxiety. Hopelessness. Loss of interest in activities you enjoy. Inability to concentrate. Trouble sleeping or sleeping too much. Loss of appetite or overeating. Thoughts of death or of hurting yourself. Follow these instructions at home: Medicines Take over-the-counter and prescription medicines only as told by your health care provider. Activity Rest at home until your energy level returns. Return to your normal activities as told by your health care provider. Ask your health care provider what activities are safe for you. General instructions Keep all follow-up visits as told by your health care provider. This is important. It may be helpful to meet with others who have experienced pregnancy loss. Ask your health care provider about support groups and resources. To help you and your partner with the process of grieving, talk with your health care provider or seek counseling. When you are ready, meet with your health care provider to discuss steps to take for a future pregnancy. Where to find more information [place] Department of Health and Programmer, systems on Women's Health: [place] American Pregnancy Association:  www.americanpregnancy.org Contact a health care provider if: You continue to experience grief, sadness, or lack of motivation for everyday activities, and those feelings do not improve over time. You are struggling to recover emotionally, especially if you are using alcohol or substances to help. Get help right away if: You have thoughts of hurting yourself or others. If you ever feel like you may hurt yourself or others, or have thoughts about taking your own life, get help right away. You can go to your nearest emergency department or call: Your local emergency services (911 in the [place] A suicide crisis helpline, such as the [facility] at 905-626-8818. This is open 24 hours a day. Summary Any pregnancy loss can be difficult physically and emotionally. You may experience many different emotions while you grieve. Emotional recovery can last longer than physical recovery. It is normal to have emotional stress after a pregnancy loss. But emotional stress that lasts a long time or becomes severe requires treatment. See your health care provider if you are struggling emotionally after a pregnancy loss. This information is not intended to replace advice given to you by your health  care provider. Make sure you discuss any questions you have with your health care provider. Document Released: 08/30/2017 Document Revised: 08/30/2017 Document Reviewed: 08/30/2017 Elsevier Interactive Patient Education  2019 Elsevier Inc.  AMBULATORY SURGERY  DISCHARGE INSTRUCTIONS   The drugs that you were given will stay in your system until tomorrow so for the next 24 hours you should not:  Drive an automobile Make any legal decisions Drink any alcoholic beverage   You may resume regular meals tomorrow.  Today it is better to start with liquids and gradually work up to solid foods.  You may eat anything you prefer, but it is better to start with liquids, then soup and crackers,  and gradually work up to solid foods.   Please notify your doctor immediately if you have any unusual bleeding, trouble breathing, redness and pain at the surgery site, drainage, fever, or pain not relieved by medication.    Additional Instructions:        Please contact your physician with any problems or Same Day Surgery at [phone], Monday through Friday 6 am to 4 pm, or [place] [facility] number at [phone].  

## 2021-02-03 NOTE — Op Note (Signed)
Operative Report Suction Dilation and Curettage   Indications: Spontaneous miscarriage   Pre-operative Diagnosis: Missed abortion at 7 weeks  Post-operative Diagnosis: same.  Procedure: 1. Suction D&C  Surgeon: Christeen Douglas MD MPH  Assistant(s):  None  Anesthesia: General LMA anesthesia  Estimated Blood Loss:  less than 50 mL         Intraoperative medications:  none         Total IV Fluids:  Urine Output: 38ml         Specimens: products of conception         Complications:  None; patient tolerated the procedure well.         Disposition: PACU - hemodynamically stable.         Condition: stable  Findings: Uterus measuring 7 weeks but limited by habitus; normal cervix, vagina, perineum.   Indication for procedure/Consents: 35 y.o. G2P1001  at 7 wks by ultrasound with non-viable pregnancy, here for scheduled surgery for the aforementioned diagnoses.    Risks of surgery were discussed with the patient including but not limited to: bleeding which may require transfusion; infection which may require antibiotics; injury to uterus or surrounding organs; intrauterine scarring which may impair future fertility; need for additional procedures including laparotomy or laparoscopy; and other postoperative/anesthesia complications. Written informed consent was obtained.    Procedure Details:   The patient was then taken to the operating room where general anesthesia was administered and was found to be adequate.  After a formal and adequate timeout was performed, she was placed in the dorsal lithotomy position and examined with the above findings. She was then prepped and draped in the sterile manner.   Her bladder was catheterized for an estimated amount of clear, yellow urine. A speculum was then placed in the patient's vagina and a single tooth tenaculum was applied to the anterior lip of the cervix.    No uterine sounding was performed on this pregnant uterus. Her cervix  was serially dilated to accommodate a 7 sized flexible suction curette.  A sharp curettage was then performed until there was a gritty texture in all four quadrants.  The tenaculum was removed from the anterior lip of the cervix and the vaginal speculum was removed after noting good hemostasis. The patient tolerated the procedure well and was taken to the recovery area awake, extubated and in stable condition.  The patient will be discharged to home as per PACU criteria.  She will receive another dose of oral antibiotics prior to discharge. Routine postoperative instructions given.  She was prescribed Percocet, Ibuprofen and Colace.  She will follow up in the clinic in two weeks for postoperative evaluation.

## 2021-02-03 NOTE — Anesthesia Preprocedure Evaluation (Signed)
Anesthesia Evaluation  Patient identified by MRN, date of birth, ID band Patient awake    Reviewed: Allergy & Precautions, NPO status , Patient's Chart, lab work & pertinent test results  History of Anesthesia Complications (+) PONV and history of anesthetic complications  Airway Mallampati: III  TM Distance: >3 FB Neck ROM: Full    Dental no notable dental hx. (+) Teeth Intact   Pulmonary neg pulmonary ROS, neg sleep apnea, neg COPD, Patient abstained from smoking.Not current smoker,    Pulmonary exam normal breath sounds clear to auscultation       Cardiovascular Exercise Tolerance: Good METShypertension, (-) CAD and (-) Past MI (-) dysrhythmias  Rhythm:Regular Rate:Normal - Systolic murmurs    Neuro/Psych PSYCHIATRIC DISORDERS Anxiety negative neurological ROS     GI/Hepatic neg GERD  ,(+)     (-) substance abuse  ,   Endo/Other  neg diabetesMorbid obesity  Renal/GU negative Renal ROS     Musculoskeletal   Abdominal (+) + obese,   Peds  Hematology   Anesthesia Other Findings Past Medical History: No date: Allergy No date: Anxiety No date: Medical history non-contributory No date: Pregnancy induced hypertension  Reproductive/Obstetrics Incomplete abortion at 7 weeks                             Anesthesia Physical Anesthesia Plan  ASA: 3  Anesthesia Plan: General   Post-op Pain Management:    Induction: Intravenous  PONV Risk Score and Plan: 4 or greater and Ondansetron, Dexamethasone, Propofol infusion, TIVA and Midazolam  Airway Management Planned: Oral ETT  Additional Equipment: None  Intra-op Plan:   Post-operative Plan: Extubation in OR  Informed Consent: I have reviewed the patients History and Physical, chart, labs and discussed the procedure including the risks, benefits and alternatives for the proposed anesthesia with the patient or authorized representative  who has indicated his/her understanding and acceptance.     Dental advisory given  Plan Discussed with: CRNA and Surgeon  Anesthesia Plan Comments: (Discussed risks of anesthesia with patient, including PONV, sore throat, lip/dental damage. Rare risks discussed as well, such as cardiorespiratory and neurological sequelae, and allergic reactions. Patient understands.)        Anesthesia Quick Evaluation

## 2021-02-04 LAB — SURGICAL PATHOLOGY

## 2021-04-14 ENCOUNTER — Encounter: Payer: Self-pay | Admitting: Podiatry

## 2021-04-19 ENCOUNTER — Other Ambulatory Visit: Payer: Self-pay | Admitting: Podiatry

## 2021-04-28 NOTE — Discharge Instructions (Addendum)
[facility]  POST OPERATIVE INSTRUCTIONS FOR DR. FOWLER AND [facility] P[facility]   Take your medication as prescribed.  Pain medication should be taken only as needed.  Keep the dressing clean, dry and intact.  Keep your foot elevated above the heart level for the first 48 hours.  Walking to the bathroom and brief periods of walking are acceptable, unless we have instructed you to be non-weight bearing.  Always wear your post-op shoe when walking.  Always use your crutches if you are to be non-weight bearing.  Do not take a shower. Baths are permissible as long as the foot is kept out of the water.   Every hour you are awake:  Bend your knee 15 times. Flex foot 15 times Massage calf 15 times  Call [facility] ([phone]) if any of the following problems occur: You develop a temperature or fever. The bandage becomes saturated with blood. Medication does not stop your pain. Injury of the foot occurs. Any symptoms of infection including redness, odor, or red streaks running from wound.  Information for Discharge Teaching: EXPAREL (bupivacaine liposome injectable suspension)   Your surgeon or anesthesiologist gave you EXPAREL(bupivacaine) to help control your pain after surgery.  EXPAREL is a local anesthetic that provides pain relief by numbing the tissue around the surgical site. EXPAREL is designed to release pain medication over time and can control pain for up to 72 hours. Depending on how you respond to EXPAREL, you may require less pain medication during your recovery.  Possible side effects: Temporary loss of sensation or ability to move in the area where bupivacaine was injected. Nausea, vomiting, constipation Rarely, numbness and tingling in your mouth or lips, lightheadedness, or anxiety may occur. Call your doctor right away if you think you may be experiencing any of these sensations, or if  you have other questions regarding possible side effects.  Follow all other discharge instructions given to you by your surgeon or nurse. Eat a healthy diet and drink plenty of water or other fluids.  If you return to the hospital for any reason within 96 hours following the administration of EXPAREL, it is important for health care providers to know that you have received this anesthetic. A teal colored band has been placed on your arm with the date, time and amount of EXPAREL you have received in order to alert and inform your health care providers. Please leave this armband in place for the full 96 hours following administration, and then you may remove the band.   

## 2021-05-04 ENCOUNTER — Other Ambulatory Visit: Payer: Self-pay

## 2021-05-04 ENCOUNTER — Ambulatory Visit: Payer: 59 | Admitting: Anesthesiology

## 2021-05-04 ENCOUNTER — Ambulatory Visit
Admission: RE | Admit: 2021-05-04 | Discharge: 2021-05-04 | Disposition: A | Discharge status: Home / Self Care | Payer: 59 | Source: Ambulatory Visit | Attending: Podiatry | Admitting: Podiatry

## 2021-05-04 ENCOUNTER — Encounter: Payer: Self-pay | Admitting: Podiatry

## 2021-05-04 ENCOUNTER — Encounter
Admission: RE | Disposition: A | Discharge status: Home / Self Care | Payer: Self-pay | Source: Ambulatory Visit | Attending: Podiatry

## 2021-05-04 DIAGNOSIS — E669 Obesity, unspecified: Secondary | ICD-10-CM | POA: Diagnosis not present

## 2021-05-04 DIAGNOSIS — M2011 Hallux valgus (acquired), right foot: Secondary | ICD-10-CM | POA: Diagnosis present

## 2021-05-04 DIAGNOSIS — Z6839 Body mass index (BMI) 39.0-39.9, adult: Secondary | ICD-10-CM | POA: Insufficient documentation

## 2021-05-04 DIAGNOSIS — I10 Essential (primary) hypertension: Secondary | ICD-10-CM | POA: Insufficient documentation

## 2021-05-04 HISTORY — DX: Nausea with vomiting, unspecified: R11.2

## 2021-05-04 HISTORY — DX: Headache, unspecified: R51.9

## 2021-05-04 HISTORY — PX: BUNIONECTOMY: SHX129

## 2021-05-04 HISTORY — DX: Other specified postprocedural states: Z98.890

## 2021-05-04 LAB — POCT PREGNANCY, URINE: Preg Test, Ur: NEGATIVE

## 2021-05-04 SURGERY — BUNIONECTOMY
Anesthesia: General | Site: Foot | Laterality: Right

## 2021-05-04 MED ORDER — FENTANYL CITRATE PF 50 MCG/ML IJ SOSY: 25.0000 ug | PREFILLED_SYRINGE | INTRAMUSCULAR | Status: DC | PRN

## 2021-05-04 MED ORDER — OXYCODONE HCL 5 MG/5ML PO SOLN: 5.0000 mg | Freq: Once | ORAL | Status: AC | PRN

## 2021-05-04 MED ORDER — POVIDONE-IODINE 7.5 % EX SOLN: Freq: Once | CUTANEOUS | Status: AC

## 2021-05-04 MED ORDER — ONDANSETRON HCL 4 MG PO TABS: 4.0000 mg | ORAL_TABLET | Freq: Four times a day (QID) | ORAL | Status: DC | PRN

## 2021-05-04 MED ORDER — SODIUM CHLORIDE 0.9 % IR SOLN
Status: DC | PRN
  Administered 2021-05-04: 500 mL

## 2021-05-04 MED ORDER — MIDAZOLAM HCL 5 MG/5ML IJ SOLN
INTRAMUSCULAR | Status: DC | PRN
  Administered 2021-05-04: 2 mg via INTRAVENOUS

## 2021-05-04 MED ORDER — BUPIVACAINE HCL (PF) 0.25 % IJ SOLN
INTRAMUSCULAR | Status: DC | PRN
  Administered 2021-05-04 (×2): 10 mL

## 2021-05-04 MED ORDER — OXYCODONE-ACETAMINOPHEN 5-325 MG PO TABS: 1.0000 | ORAL_TABLET | Freq: Four times a day (QID) | ORAL | 0 refills | Status: DC | PRN

## 2021-05-04 MED ORDER — OXYCODONE HCL 5 MG PO TABS
5.0000 mg | ORAL_TABLET | Freq: Once | ORAL | Status: AC | PRN
  Administered 2021-05-04: 5 mg via ORAL

## 2021-05-04 MED ORDER — PROPOFOL 500 MG/50ML IV EMUL
INTRAVENOUS | Status: DC | PRN
  Administered 2021-05-04: 140 ug/kg/min via INTRAVENOUS

## 2021-05-04 MED ORDER — ACETAMINOPHEN 10 MG/ML IV SOLN
1000.0000 mg | Freq: Once | INTRAVENOUS | Status: AC
  Administered 2021-05-04: 1000 mg via INTRAVENOUS

## 2021-05-04 MED ORDER — PROPOFOL 10 MG/ML IV BOLUS
INTRAVENOUS | Status: DC | PRN
  Administered 2021-05-04: 200 mg via INTRAVENOUS

## 2021-05-04 MED ORDER — SODIUM CHLORIDE 0.9 % IV SOLN: 400.0000 mg | Freq: Once | INTRAVENOUS | Status: DC | PRN

## 2021-05-04 MED ORDER — CEFAZOLIN SODIUM-DEXTROSE 2-4 GM/100ML-% IV SOLN: 2.0000 g | INTRAVENOUS | Status: DC

## 2021-05-04 MED ORDER — CEFAZOLIN SODIUM-DEXTROSE 2-3 GM-%(50ML) IV SOLR
INTRAVENOUS | Status: DC | PRN
  Administered 2021-05-04: 2 g via INTRAVENOUS

## 2021-05-04 MED ORDER — ONDANSETRON HCL 4 MG PO TABS: 4.0000 mg | ORAL_TABLET | Freq: Every day | ORAL | 1 refills | Status: DC | PRN

## 2021-05-04 MED ORDER — LIDOCAINE HCL (CARDIAC) PF 100 MG/5ML IV SOSY
PREFILLED_SYRINGE | INTRAVENOUS | Status: DC | PRN
  Administered 2021-05-04: 50 mg via INTRATRACHEAL

## 2021-05-04 MED ORDER — METOCLOPRAMIDE HCL 5 MG/ML IJ SOLN: 5.0000 mg | Freq: Three times a day (TID) | INTRAMUSCULAR | Status: DC | PRN

## 2021-05-04 MED ORDER — ACETAMINOPHEN 325 MG PO TABS: 325.0000 mg | ORAL_TABLET | ORAL | Status: DC | PRN

## 2021-05-04 MED ORDER — GLYCOPYRROLATE 0.2 MG/ML IJ SOLN
INTRAMUSCULAR | Status: DC | PRN
  Administered 2021-05-04: 0.4 mg via INTRAVENOUS
  Administered 2021-05-04: .1 mg via INTRAVENOUS

## 2021-05-04 MED ORDER — LACTATED RINGERS IV SOLN: INTRAVENOUS | Status: DC

## 2021-05-04 MED ORDER — SODIUM CHLORIDE 0.9 % IV SOLN
800.0000 mg | Freq: Once | INTRAVENOUS | Status: AC | PRN
  Administered 2021-05-04: 800 mg via INTRAVENOUS

## 2021-05-04 MED ORDER — BUPIVACAINE LIPOSOME 1.3 % IJ SUSP
INTRAMUSCULAR | Status: DC | PRN
  Administered 2021-05-04 (×2): 10 mL

## 2021-05-04 MED ORDER — SCOPOLAMINE 1 MG/3DAYS TD PT72
1.0000 | MEDICATED_PATCH | Freq: Once | TRANSDERMAL | Status: DC
  Administered 2021-05-04: 1.5 mg via TRANSDERMAL

## 2021-05-04 MED ORDER — METOCLOPRAMIDE HCL 5 MG PO TABS: 5.0000 mg | ORAL_TABLET | Freq: Three times a day (TID) | ORAL | Status: DC | PRN

## 2021-05-04 MED ORDER — FENTANYL CITRATE (PF) 100 MCG/2ML IJ SOLN
INTRAMUSCULAR | Status: DC | PRN
  Administered 2021-05-04 (×4): 50 ug via INTRAVENOUS

## 2021-05-04 MED ORDER — DEXAMETHASONE SODIUM PHOSPHATE 4 MG/ML IJ SOLN
INTRAMUSCULAR | Status: DC | PRN
  Administered 2021-05-04: 4 mg via INTRAVENOUS

## 2021-05-04 MED ORDER — ACETAMINOPHEN 160 MG/5ML PO SOLN: 325.0000 mg | ORAL | Status: DC | PRN

## 2021-05-04 MED ORDER — ONDANSETRON HCL 4 MG/2ML IJ SOLN: 4.0000 mg | Freq: Four times a day (QID) | INTRAMUSCULAR | Status: DC | PRN

## 2021-05-04 SURGICAL SUPPLY — 39 items
APL SKNCLS STERI-STRIP NONHPOA (GAUZE/BANDAGES/DRESSINGS) ×1
BENZOIN TINCTURE PRP APPL 2/3 (GAUZE/BANDAGES/DRESSINGS) ×2 IMPLANT
BNDG CMPR 75X41 PLY HI ABS (GAUZE/BANDAGES/DRESSINGS) ×1
BNDG ELASTIC 4X5.8 VLCR STR LF (GAUZE/BANDAGES/DRESSINGS) ×2 IMPLANT
BNDG ESMARK 4X12 TAN STRL LF (GAUZE/BANDAGES/DRESSINGS) ×2 IMPLANT
BNDG STRETCH 4X75 STRL LF (GAUZE/BANDAGES/DRESSINGS) ×2 IMPLANT
BOOT STEPPER DURA SM (SOFTGOODS) ×2 IMPLANT
CANISTER SUCT 1200ML W/VALVE (MISCELLANEOUS) ×2 IMPLANT
COVER LIGHT HANDLE UNIVERSAL (MISCELLANEOUS) ×4 IMPLANT
CUFF TOURN SGL QUICK 18X4 (TOURNIQUET CUFF) ×2 IMPLANT
DRAPE FLUOR MINI C-ARM 54X84 (DRAPES) ×2 IMPLANT
DURAPREP 26ML APPLICATOR (WOUND CARE) ×2 IMPLANT
ELECT REM PT RETURN 9FT ADLT (ELECTROSURGICAL) ×2
ELECTRODE REM PT RTRN 9FT ADLT (ELECTROSURGICAL) ×1 IMPLANT
GAUZE SPONGE 4X4 12PLY STRL (GAUZE/BANDAGES/DRESSINGS) ×2 IMPLANT
GAUZE XEROFORM 1X8 LF (GAUZE/BANDAGES/DRESSINGS) ×2 IMPLANT
GLOVE SRG 8 PF TXTR STRL LF DI (GLOVE) ×1 IMPLANT
GLOVE SURG ENC MOIS LTX SZ7.5 (GLOVE) ×2 IMPLANT
GLOVE SURG UNDER POLY LF SZ8 (GLOVE) ×2
GOWN STRL REUS W/ TWL LRG LVL3 (GOWN DISPOSABLE) ×2 IMPLANT
GOWN STRL REUS W/TWL LRG LVL3 (GOWN DISPOSABLE) ×4
KIT TURNOVER KIT A (KITS) ×2 IMPLANT
LAPIPLASTY SYS 4A (Orthopedic Implant) ×2 IMPLANT
NS IRRIG 500ML POUR BTL (IV SOLUTION) ×2 IMPLANT
PACK EXTREMITY ARMC (MISCELLANEOUS) ×2 IMPLANT
PENCIL SMOKE EVACUATOR (MISCELLANEOUS) ×2 IMPLANT
RASP SM TEAR CROSS CUT (RASP) ×2 IMPLANT
SCREW 2.7 HIGH PITCH LOCKING (Screw) ×2 IMPLANT
SCREW HIGH PITCH LOCK 2.7 (Screw) ×2 IMPLANT
STOCKINETTE IMPERVIOUS LG (DRAPES) ×2 IMPLANT
STRIP CLOSURE SKIN 1/4X4 (GAUZE/BANDAGES/DRESSINGS) ×2 IMPLANT
SUT ETHILON 3-0 (SUTURE) ×2 IMPLANT
SUT MNCRL 4-0 (SUTURE) ×2
SUT MNCRL 4-0 27XMFL (SUTURE) ×1
SUT VIC AB 3-0 SH 27 (SUTURE) ×2
SUT VIC AB 3-0 SH 27X BRD (SUTURE) ×1 IMPLANT
SUT VIC AB 4-0 FS2 27 (SUTURE) ×2 IMPLANT
SUTURE MNCRL 4-0 27XMF (SUTURE) ×1 IMPLANT
SYSTEM LAPIPLASTY 4A (Orthopedic Implant) ×1 IMPLANT

## 2021-05-04 NOTE — H&P (Signed)
HISTORY AND PHYSICAL INTERVAL NOTE:  05/04/2021  1:44 PM  Danielle Sanchez  has presented today for surgery, with the diagnosis of M20.11- Hallux valgus of right foot.  The various methods of treatment have been discussed with the patient.  No guarantees were given.  After consideration of risks, benefits and other options for treatment, the patient has consented to surgery.  I have reviewed the patients' chart and labs.     A history and physical examination was performed in my office.  The patient was reexamined.  There have been no changes to this history and physical examination.  Danielle Sanchez A

## 2021-05-04 NOTE — Transfer of Care (Signed)
Immediate Anesthesia Transfer of Care Note  Patient: Danielle Sanchez  Procedure(s) Performed: Arbutus Leas- LAPIDUS-TYPE (Right: Foot)  Patient Location: PACU  Anesthesia Type: General  Level of Consciousness: awake, alert  and patient cooperative  Airway and Oxygen Therapy: Patient Spontanous Breathing and Patient connected to supplemental oxygen  Post-op Assessment: Post-op Vital signs reviewed, Patient's Cardiovascular Status Stable, Respiratory Function Stable, Patent Airway and No signs of Nausea or vomiting  Post-op Vital Signs: Reviewed and stable  Complications: No notable events documented.

## 2021-05-04 NOTE — Anesthesia Postprocedure Evaluation (Signed)
Anesthesia Post Note  Patient: Danielle APPELHANS  Procedure(s) Performed: Arbutus Leas LAPIDUS-TYPE (Right: Foot)     Patient location during evaluation: PACU Anesthesia Type: General Level of consciousness: awake Pain management: pain level controlled Vital Signs Assessment: post-procedure vital signs reviewed and stable Respiratory status: respiratory function stable Cardiovascular status: stable Postop Assessment: no signs of nausea or vomiting Anesthetic complications: no   No notable events documented.  Jola Babinski

## 2021-05-04 NOTE — Op Note (Signed)
Operative note   Surgeon:Makayle Krahn Lawyer: None    Preop diagnosis: Hallux valgus deformity right foot    Postop diagnosis: Same    Procedure: Lapidus hallux valgus correction right foot    EBL: Minimal    Anesthesia:local and general    Hemostasis: Mid calf tourniquet inflated to 200 mmHg for 70 minutes    Specimen: None    Complications: None    Operative indications:Danielle Sanchez is an 35 y.o. that presents today for surgical intervention.  The risks/benefits/alternatives/complications have been discussed and consent has been given.    Procedure:  Patient was brought into the OR and placed on the operating table in thesupine position. After anesthesia was obtained theright lower extremity was prepped and draped in usual sterile fashion.  Attention was directed to the dorsal aspect of the foot where a dorsal incision was made at the first met cuneiforms joint.  Sharp and blunt dissection was carried down to the periosteum.  Subperiosteal dissection was then undertaken.  This exposed the first met cuneiform joint.  This was then freed and loosened.  A small fulcrum was placed between the base of the first metatarsal and second metatarsal.  Next the joint positioner was placed on the medial aspect of the metatarsal.  A small stab incision was made at the second metatarsal.  Compression was placed for the positioner.  Good realignment of the first intermetatarsal angle was noted at this time.  Attention was then directed to the dorsomedial first MTPJ where an incision was performed.  Sharp and blunt dissection was carried down to the capsule.  The intermetatarsal space was then entered.  The conjoined tendon of the abductor was then freed from the base of the proximal phalanx.  Attention was to redirected to the first met cuneiform joint.  At this time the osteotomy cut guide was placed into the joint region.  2 vertical cuts were then made.  The cartilage material was  removed from the first met cuneiform joint and the joint was then prepped with a 2.0 mm drill bit.  The joint compressor was then placed.  Good compression and realignment was noted.  Next the medial and dorsal locking plates were then placed from the [place] set.  Realignment and stability was noted in all planes.  Attention was redirected to the dorsomedial first MTPJ.  A small T capsulotomy was performed.  The dorsomedial eminence was noted and transected and smoothed with a power rasp.  A small capsulorrhaphy was performed.  Closure was then performed after all areas were irrigated.  3-0 Vicryl for the capsular tissue.  4-0 Vicryl in subcutaneous tissue and a 4-0 Monocryl for the skin.   Further local anesthesia was performed at the end of the case.    Patient tolerated the procedure and anesthesia well.  Was transported from the OR to the PACU with all vital signs stable and vascular status intact. To be discharged per routine protocol.  Will follow up in approximately 1 week in the outpatient clinic.

## 2021-05-04 NOTE — Anesthesia Preprocedure Evaluation (Addendum)
Anesthesia Evaluation  Patient identified by MRN, date of birth, ID band Patient awake    Reviewed: Allergy & Precautions, NPO status   History of Anesthesia Complications (+) PONV  Airway Mallampati: II  TM Distance: >3 FB     Dental   Pulmonary    Pulmonary exam normal        Cardiovascular hypertension,  Rhythm:Regular Rate:Normal     Neuro/Psych  Headaches, Anxiety    GI/Hepatic   Endo/Other  Obesity - BMI 39  Renal/GU      Musculoskeletal   Abdominal   Peds  Hematology   Anesthesia Other Findings   Reproductive/Obstetrics                             Anesthesia Physical Anesthesia Plan  ASA: 2  Anesthesia Plan: General   Post-op Pain Management:    Induction: Intravenous  PONV Risk Score and Plan: Treatment may vary due to age or medical condition  Airway Management Planned: LMA  Additional Equipment:   Intra-op Plan:   Post-operative Plan:   Informed Consent: I have reviewed the patients History and Physical, chart, labs and discussed the procedure including the risks, benefits and alternatives for the proposed anesthesia with the patient or authorized representative who has indicated his/her understanding and acceptance.     Dental advisory given  Plan Discussed with: CRNA  Anesthesia Plan Comments:        Anesthesia Quick Evaluation

## 2021-05-04 NOTE — Anesthesia Procedure Notes (Signed)
Procedure Name: LMA Insertion Date/Time: 05/04/2021 2:05 PM Performed by: Jimmy Picket, CRNA Pre-anesthesia Checklist: Patient identified, Emergency Drugs available, Suction available, Timeout performed and Patient being monitored Patient Re-evaluated:Patient Re-evaluated prior to induction Oxygen Delivery Method: Circle system utilized Preoxygenation: Pre-oxygenation with 100% oxygen Induction Type: IV induction LMA: LMA inserted LMA Size: 4.0 Number of attempts: 1 Placement Confirmation: positive ETCO2 and breath sounds checked- equal and bilateral Tube secured with: Tape

## 2021-05-06 ENCOUNTER — Encounter: Payer: Self-pay | Admitting: Podiatry

## 2021-07-01 ENCOUNTER — Encounter: Payer: Self-pay | Admitting: Family Medicine

## 2021-07-11 ENCOUNTER — Ambulatory Visit: Payer: 59

## 2021-07-11 ENCOUNTER — Ambulatory Visit (INDEPENDENT_AMBULATORY_CARE_PROVIDER_SITE_OTHER): Payer: 59 | Admitting: *Deleted

## 2021-07-11 ENCOUNTER — Other Ambulatory Visit: Payer: Self-pay

## 2021-07-11 VITALS — BP 115/70 | HR 55 | Wt 222.0 lb

## 2021-07-11 DIAGNOSIS — O3680X0 Pregnancy with inconclusive fetal viability, not applicable or unspecified: Secondary | ICD-10-CM

## 2021-07-11 DIAGNOSIS — Z23 Encounter for immunization: Secondary | ICD-10-CM | POA: Diagnosis not present

## 2021-07-11 DIAGNOSIS — Z3480 Encounter for supervision of other normal pregnancy, unspecified trimester: Secondary | ICD-10-CM | POA: Insufficient documentation

## 2021-07-11 DIAGNOSIS — Z3401 Encounter for supervision of normal first pregnancy, first trimester: Secondary | ICD-10-CM

## 2021-07-11 NOTE — Progress Notes (Signed)
New OB Intake  I explained I am completing New OB Intake today. We discussed her EDD of 02/21/22 that is based on LMP of 05/17/2021. Pt is G3/P1. I reviewed her allergies, medications, Medical/Surgical/OB history, and appropriate screenings.    Patient Active Problem List   Diagnosis Date Noted   Supervision of other normal pregnancy, antepartum 07/11/2021   Prediabetes 06/14/2018   Morbid obesity ([place]) 06/14/2018   Gestational hypertension w/o significant proteinuria in 3rd trimester 10/30/2017   Gestational hypertension 10/25/2017   Pregnancy 09/24/2017   Maternal obesity, antepartum 05/28/2017   Elevated prolactin level 05/28/2017    Concerns addressed today  Delivery Plans:  Plans to deliver at [facility].   MyChart/Babyscripts MyChart access verified. I explained pt will have some visits in office and some virtually. Babyscripts instructions given and order placed. Patient verifies receipt of registration text/e-mail. Account successfully created and app downloaded.  Blood Pressure Cuff  Blood pressure cuff given from office. Explained after first prenatal appt pt will check weekly and document in 39.   Anatomy US Explained first scheduled [place] will be around 19 weeks.   Labs Discussed Johnsie Cancel genetic screening with patient. Pt declined any genetics at this time. Routine prenatal labs needed.  Covid Vaccine Patient has covid vaccine.    Placed OB Box on problem list and updated   Patient informed that the ultrasound is considered a limited obstetric ultrasound and is not intended to be a complete ultrasound exam.  Patient also informed that the ultrasound is not being completed with the intent of assessing for fetal or placental anomalies or any pelvic abnormalities. Explained that the purpose of today's ultrasound is to assess for dating and fetal heart rate.  Patient acknowledges the purpose of the exam and the limitations of the study.      First visit review I  reviewed new OB appt with pt. I explained she will have ob bloodwork .Explained pt will be seen by Dr Kennon Rounds at first visit; encounter routed to appropriate provider.   Crosby Oyster, RN 07/11/2021  9:51 AM

## 2021-07-11 NOTE — Progress Notes (Signed)
Attestation of Attending Supervision of clinical support staff: I agree with the care provided to this patient and was available for any consultation.  I have reviewed the RN's note and chart. I was available for consult and to see the patient if needed.   Samit Sylve MD MPH Attending Physician Faculty Practice- [facility]  

## 2021-08-03 ENCOUNTER — Other Ambulatory Visit (HOSPITAL_COMMUNITY)
Admission: RE | Admit: 2021-08-03 | Discharge: 2021-08-03 | Disposition: A | Discharge status: Home / Self Care | Payer: 59 | Source: Ambulatory Visit | Attending: Family Medicine | Admitting: Family Medicine

## 2021-08-03 ENCOUNTER — Other Ambulatory Visit: Payer: Self-pay

## 2021-08-03 ENCOUNTER — Ambulatory Visit (INDEPENDENT_AMBULATORY_CARE_PROVIDER_SITE_OTHER): Payer: 59 | Admitting: Family Medicine

## 2021-08-03 ENCOUNTER — Encounter: Payer: Self-pay | Admitting: Family Medicine

## 2021-08-03 VITALS — BP 116/78 | HR 67 | Wt 222.0 lb

## 2021-08-03 DIAGNOSIS — O34219 Maternal care for unspecified type scar from previous cesarean delivery: Secondary | ICD-10-CM

## 2021-08-03 DIAGNOSIS — O99891 Other specified diseases and conditions complicating pregnancy: Secondary | ICD-10-CM

## 2021-08-03 DIAGNOSIS — O99111 Other diseases of the blood and blood-forming organs and certain disorders involving the immune mechanism complicating pregnancy, first trimester: Secondary | ICD-10-CM

## 2021-08-03 DIAGNOSIS — Z3480 Encounter for supervision of other normal pregnancy, unspecified trimester: Secondary | ICD-10-CM | POA: Insufficient documentation

## 2021-08-03 DIAGNOSIS — D696 Thrombocytopenia, unspecified: Secondary | ICD-10-CM

## 2021-08-03 DIAGNOSIS — R8271 Bacteriuria: Secondary | ICD-10-CM

## 2021-08-03 DIAGNOSIS — R7303 Prediabetes: Secondary | ICD-10-CM

## 2021-08-03 DIAGNOSIS — Z8759 Personal history of other complications of pregnancy, childbirth and the puerperium: Secondary | ICD-10-CM

## 2021-08-03 DIAGNOSIS — Z9884 Bariatric surgery status: Secondary | ICD-10-CM

## 2021-08-03 MED ORDER — ASPIRIN EC 81 MG PO TBEC: 81.0000 mg | DELAYED_RELEASE_TABLET | Freq: Every day | ORAL | 3 refills | Status: DC

## 2021-08-03 NOTE — Patient Instructions (Signed)
First Trimester of Pregnancy °The first trimester of pregnancy starts on the first day of your last menstrual period until the end of week 12. This is months 1 through 3 of pregnancy. A week after a sperm fertilizes an egg, the egg will implant into the wall of the uterus and begin to develop into a baby. By the end of 12 weeks, all the baby's organs will be formed and the baby will be 2-3 inches in size. °Body changes during your first trimester °Your body goes through many changes during pregnancy. The changes vary and generally return to normal after your baby is born. °Physical changes °You may gain or lose weight. °Your breasts may begin to grow larger and become tender. The tissue that surrounds your nipples (areola) may become darker. °Dark spots or blotches (chloasma or mask of pregnancy) may develop on your face. °You may have changes in your hair. These can include thickening or thinning of your hair or changes in texture. °Health changes °You may feel nauseous, and you may vomit. °You may have heartburn. °You may develop headaches. °You may develop constipation. °Your gums may bleed and may be sensitive to brushing and flossing. °Other changes °You may tire easily. °You may urinate more often. °Your menstrual periods will stop. °You may have a loss of appetite. °You may develop cravings for certain kinds of food. °You may have changes in your emotions from day to day. °You may have more vivid and strange dreams. °Follow these instructions at home: °Medicines °Follow your health care provider's instructions regarding medicine use. Specific medicines may be either safe or unsafe to take during pregnancy. Do not take any medicines unless told to by your health care provider. °Take a prenatal vitamin that contains at least 600 micrograms (mcg) of folic acid. °Eating and drinking °Eat a healthy diet that includes fresh fruits and vegetables, whole grains, good sources of protein such as meat, eggs, or tofu,  and low-fat dairy products. °Avoid raw meat and unpasteurized juice, milk, and cheese. These carry germs that can harm you and your baby. °If you feel nauseous or you vomit: °Eat 4 or 5 small meals a day instead of 3 large meals. °Try eating a few soda crackers. °Drink liquids between meals instead of during meals. °You may need to take these actions to prevent or treat constipation: °Drink enough fluid to keep your urine pale yellow. °Eat foods that are high in fiber, such as beans, whole grains, and fresh fruits and vegetables. °Limit foods that are high in fat and processed sugars, such as fried or sweet foods. °Activity °Exercise only as directed by your health care provider. Most people can continue their usual exercise routine during pregnancy. Try to exercise for 30 minutes at least 5 days a week. °Stop exercising if you develop pain or cramping in the lower abdomen or lower back. °Avoid exercising if it is very hot or humid or if you are at high altitude. °Avoid heavy lifting. °If you choose to, you may have sex unless your health care provider tells you not to. °Relieving pain and discomfort °Wear a good support bra to relieve breast tenderness. °Rest with your legs elevated if you have leg cramps or low back pain. °If you develop bulging veins (varicose veins) in your legs: °Wear support hose as told by your health care provider. °Elevate your feet for 15 minutes, 3-4 times a day. °Limit salt in your diet. °Safety °Wear your seat belt at all times when driving   or riding in a car. °Talk with your health care provider if someone is verbally or physically abusive to you. °Talk with your health care provider if you are feeling sad or have thoughts of hurting yourself. °Lifestyle °Do not use hot tubs, steam rooms, or saunas. °Do not douche. Do not use tampons or scented sanitary pads. °Do not use herbal remedies, alcohol, illegal drugs, or medicines that are not approved by your health care provider. Chemicals  in these products can harm your baby. °Do not use any products that contain nicotine or tobacco, such as cigarettes, e-cigarettes, and chewing tobacco. If you need help quitting, ask your health care provider. °Avoid cat litter boxes and soil used by cats. These carry germs that can cause birth defects in the baby and possibly loss of the unborn baby (fetus) by miscarriage or stillbirth. °General instructions °During routine prenatal visits in the first trimester, your health care provider will do a physical exam, perform necessary tests, and ask you how things are going. Keep all follow-up visits. This is important. °Ask for help if you have counseling or nutritional needs during pregnancy. Your health care provider can offer advice or refer you to specialists for help with various needs. °Schedule a dentist appointment. At home, brush your teeth with a soft toothbrush. Floss gently. °Write down your questions. Take them to your prenatal visits. °Where to find more information °American Pregnancy Association: americanpregnancy.org °American College of Obstetricians and Gynecologists: acog.org/en/Womens%20Health/Pregnancy °Office on Women's Health: womenshealth.gov/pregnancy °Contact a health care provider if you have: °Dizziness. °A fever. °Mild pelvic cramps, pelvic pressure, or nagging pain in the abdominal area. °Nausea, vomiting, or diarrhea that lasts for 24 hours or longer. °A bad-smelling vaginal discharge. °Pain when you urinate. °Known exposure to a contagious illness, such as chickenpox, measles, Zika virus, HIV, or hepatitis. °Get help right away if you have: °Spotting or bleeding from your vagina. °Severe abdominal cramping or pain. °Shortness of breath or chest pain. °Any kind of trauma, such as from a fall or a car crash. °New or increased pain, swelling, or redness in an arm or leg. °Summary °The first trimester of pregnancy starts on the first day of your last menstrual period until the end of week  12 (months 1 through 3). °Eating 4 or 5 small meals a day rather than 3 large meals may help to relieve nausea and vomiting. °Do not use any products that contain nicotine or tobacco, such as cigarettes, e-cigarettes, and chewing tobacco. If you need help quitting, ask your health care provider. °Keep all follow-up visits. This is important. °This information is not intended to replace advice given to you by your health care provider. Make sure you discuss any questions you have with your health care provider. °Document Revised: 11/26/2019 Document Reviewed: 10/02/2019 °Elsevier Patient Education © 2022 [employer] ° °

## 2021-08-03 NOTE — Progress Notes (Signed)
Subjective:   Danielle Sanchez is a 36 y.o. G3P1011 at [redacted]w[redacted]d by LMP, early ultrasound being seen today for her first obstetrical visit.  Her obstetrical history is significant for obesity, pregnancy induced hypertension, and previous cesarean section . Patient does intend to breast feed. Pregnancy history fully reviewed.  Patient reports no complaints.  HISTORY: OB History  Gravida Para Term Preterm AB Living  3 1 1  0 1 1  SAB IAB Ectopic Multiple Live Births  1 0 0 0 1    # Outcome Date GA Lbr Len/2nd Weight Sex Delivery Anes PTL Lv  3 Current           2 SAB 01/2021          1 Term 11/01/17 [redacted]w[redacted]d  6 lb 15.5 oz (3.16 kg) M CS-LTranv EPI  LIV     Name: Greeno,BOY Rhian     Apgar1: [place]: 8   Last pap smear was  07/30/2020 and was normal Past Medical History:  Diagnosis Date   Allergy    Anxiety    Headache    Medical history non-contributory    PONV (postoperative nausea and vomiting)    Scopolamine patch not helpful. TIVA with propofol on 05/04/21 (with decadron/zofran) - no PONV   Pregnancy induced hypertension    Past Surgical History:  Procedure Laterality Date   ANTERIOR CRUCIATE LIGAMENT REPAIR Right    bone spur Left    BUNIONECTOMY Right 05/04/2021   Procedure: BUNIONECTOMY- LAPIDUS-TYPE;  Surgeon: Samara Deist, DPM;  Location: [place];  Service: Podiatry;  Laterality: Right;   CESAREAN SECTION N/A 11/01/2017   Procedure: CESAREAN SECTION;  Surgeon: Ward, Honor Loh, MD;  Location: [facility];  Service: Obstetrics;  Laterality: N/A;   DILATION AND CURETTAGE OF UTERUS N/A 02/03/2021   Procedure: DILATATION AND CURETTAGE;  Surgeon: Benjaman Kindler, MD;  Location: [facility];  Service: Gynecology;  Laterality: N/A;   LAPAROSCOPIC GASTRIC RESTRICTIVE DUODENAL PROCEDURE (DUODENAL SWITCH)  10/2020   MENISCUS REPAIR Right    WISDOM TOOTH EXTRACTION  2017   Family History  Problem Relation Age of Onset   Diabetes Maternal Grandmother    Hypertension  Maternal Grandmother    Hypotension Mother    Breast cancer Neg Hx    Social History   Tobacco Use   Smoking status: Never   Smokeless tobacco: Never  Vaping Use   Vaping Use: Never used  Substance Use Topics   Alcohol use: No   Drug use: No   Allergies  Allergen Reactions   Promethazine Other (See Comments)    Abdominal pain/dizziness/fatigue   Azithromycin Swelling   Current Outpatient Medications on File Prior to Visit  Medication Sig Dispense Refill   ferrous sulfate 325 (65 FE) MG tablet Take 325 mg by mouth daily with breakfast.     Multiple Minerals-Vitamins (CAL MAG ZINC +D3 PO) Take 2 tablets by mouth in the morning, at noon, and at bedtime.     NASCOBAL 500 MCG/0.1ML SOLN Place 1 spray into both nostrils every Saturday.     sertraline (ZOLOFT) 100 MG tablet Take 100 mg by mouth daily.     No current facility-administered medications on file prior to visit.     Exam   Vitals:   08/03/21 0938  BP: 116/78  Pulse: 67  Weight: 222 lb (100.7 kg)   Fetal Heart Rate (bpm): 152  System: General: well-developed, well-nourished female in no acute distress   Skin: normal coloration  and turgor, no rashes   Neurologic: oriented, normal, negative, normal mood   Extremities: normal strength, tone, and muscle mass, ROM of all joints is normal   HEENT PERRLA, extraocular movement intact and sclera clear, anicteric   Mouth/Teeth mucous membranes moist, pharynx normal without lesions and dental hygiene good   Neck supple and no masses   Cardiovascular: regular rate and rhythm   Respiratory:  no respiratory distress, normal breath sounds   Abdomen: soft, non-tender; bowel sounds normal; no masses,  no organomegaly     Assessment:   Pregnancy: G3P1011 Patient Active Problem List   Diagnosis Date Noted   Previous cesarean delivery affecting pregnancy, antepartum 08/03/2021   Supervision of other normal pregnancy, antepartum 07/11/2021   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 Supervision of other normal pregnancy, antepartum New OB labs Declines genetics - Culture, OB Urine - Urine cytology ancillary only([place]) - CBC/D/Plt+RPR+Rh+ABO+RubIgG... - TSH - [place] MFM OB DETAIL +14 WK; Future  2. Prediabetes Last A1C was 5.2 following Weight loss surgery - HgB A1c  3. History of gestational hypertension Baseline labs Begin ASA - Comprehensive metabolic panel - Protein / creatinine ratio, urine - aspirin EC 81 MG tablet; Take 1 tablet (81 mg total) by mouth daily.  Dispense: 90 tablet; Refill: 3  4. Previous cesarean delivery affecting pregnancy, antepartum Desires TOLAC potentially to discuss more  as pregnancy progresses  5. S/P biliopancreatic diversion with duodenal switch Last labs ok in 11/22--will repeat - B12 and Folate Panel - Ferritin - Vitamin D 25 hydroxy - Zinc - Vitamin A   Initial labs drawn. Continue prenatal vitamins. Genetic Screening discussed, NIPS: declined. Ultrasound discussed; fetal anatomic survey:  scheduled . Problem list reviewed and updated. The nature of [place] with multiple MDs and other Advanced Practice Providers was explained to patient; also emphasized that residents, students are part of our team. Routine obstetric precautions reviewed. Return in 4 weeks (on 08/31/2021).

## 2021-08-04 ENCOUNTER — Encounter: Payer: Self-pay | Admitting: Family Medicine

## 2021-08-04 DIAGNOSIS — D696 Thrombocytopenia, unspecified: Secondary | ICD-10-CM | POA: Insufficient documentation

## 2021-08-04 LAB — URINE CYTOLOGY ANCILLARY ONLY
Chlamydia: NEGATIVE
Comment: NEGATIVE
Comment: NORMAL
Neisseria Gonorrhea: NEGATIVE

## 2021-08-04 LAB — TSH: TSH: 1.83 u[IU]/mL (ref 0.450–4.500)

## 2021-08-04 LAB — COMPREHENSIVE METABOLIC PANEL
ALT: 19 IU/L (ref 0–32)
AST: 19 IU/L (ref 0–40)
Albumin/Globulin Ratio: 2 (ref 1.2–2.2)
Albumin: 4.1 g/dL (ref 3.8–4.8)
Alkaline Phosphatase: 81 IU/L (ref 44–121)
BUN/Creatinine Ratio: 13 (ref 9–23)
BUN: 7 mg/dL (ref 6–20)
Bilirubin Total: 0.3 mg/dL (ref 0.0–1.2)
CO2: 20 mmol/L (ref 20–29)
Calcium: 8.8 mg/dL (ref 8.7–10.2)
Chloride: 104 mmol/L (ref 96–106)
Creatinine, Ser: 0.56 mg/dL — ABNORMAL LOW (ref 0.57–1.00)
Globulin, Total: 2.1 g/dL (ref 1.5–4.5)
Glucose: 77 mg/dL (ref 70–99)
Potassium: 3.9 mmol/L (ref 3.5–5.2)
Sodium: 137 mmol/L (ref 134–144)
Total Protein: 6.2 g/dL (ref 6.0–8.5)
eGFR: 124 mL/min/{1.73_m2} (ref >59)

## 2021-08-04 LAB — CBC/D/PLT+RPR+RH+ABO+RUBIGG...
Antibody Screen: NEGATIVE
Basophils Absolute: 0 10*3/uL (ref 0.0–0.2)
Basos: 0 %
EOS (ABSOLUTE): 0.1 10*3/uL (ref 0.0–0.4)
Eos: 1 %
HCV Ab: <0.1 s/co ratio (ref 0.0–0.9)
HIV Screen 4th Generation wRfx: NONREACTIVE
Hematocrit: 40.9 % (ref 34.0–46.6)
Hemoglobin: 14.1 g/dL (ref 11.1–15.9)
Hepatitis B Surface Ag: NEGATIVE
Immature Grans (Abs): 0 10*3/uL (ref 0.0–0.1)
Immature Granulocytes: 0 %
Lymphocytes Absolute: 1.6 10*3/uL (ref 0.7–3.1)
Lymphs: 21 %
MCH: 29.5 pg (ref 26.6–33.0)
MCHC: 34.5 g/dL (ref 31.5–35.7)
MCV: 86 fL (ref 79–97)
Monocytes Absolute: 0.3 10*3/uL (ref 0.1–0.9)
Monocytes: 4 %
Neutrophils Absolute: 5.5 10*3/uL (ref 1.4–7.0)
Neutrophils: 74 %
Platelets: 138 10*3/uL — ABNORMAL LOW (ref 150–450)
RBC: 4.78 x10E6/uL (ref 3.77–5.28)
RDW: 13.1 % (ref 11.7–15.4)
RPR Ser Ql: NONREACTIVE
Rh Factor: NEGATIVE
Rubella Antibodies, IGG: 1.69 index (ref >0.99)
WBC: 7.6 10*3/uL (ref 3.4–10.8)

## 2021-08-04 LAB — PROTEIN / CREATININE RATIO, URINE
Creatinine, Urine: 141.6 mg/dL
Protein, Ur: 17.1 mg/dL
Protein/Creat Ratio: 121 mg/g creat (ref 0–200)

## 2021-08-04 LAB — HEMOGLOBIN A1C
Est. average glucose Bld gHb Est-mCnc: 100 mg/dL
Hgb A1c MFr Bld: 5.1 % (ref 4.8–5.6)

## 2021-08-04 LAB — HCV INTERPRETATION

## 2021-08-06 LAB — CULTURE, OB URINE

## 2021-08-06 LAB — URINE CULTURE, OB REFLEX

## 2021-08-07 ENCOUNTER — Encounter: Payer: Self-pay | Admitting: Family Medicine

## 2021-08-08 ENCOUNTER — Other Ambulatory Visit: Payer: Self-pay | Admitting: *Deleted

## 2021-08-08 DIAGNOSIS — D696 Thrombocytopenia, unspecified: Secondary | ICD-10-CM

## 2021-08-08 MED ORDER — CEFADROXIL 500 MG PO CAPS: 500.0000 mg | ORAL_CAPSULE | Freq: Two times a day (BID) | ORAL | 0 refills | Status: AC

## 2021-08-08 NOTE — Addendum Note (Signed)
Addended by: Reva Bores on: 08/08/2021 10:39 AM   Modules accepted: Orders

## 2021-08-11 ENCOUNTER — Encounter: Payer: Self-pay | Admitting: *Deleted

## 2021-08-16 ENCOUNTER — Inpatient Hospital Stay: Payer: 59

## 2021-08-16 ENCOUNTER — Other Ambulatory Visit: Payer: Self-pay

## 2021-08-16 ENCOUNTER — Encounter: Payer: Self-pay | Admitting: Oncology

## 2021-08-16 ENCOUNTER — Inpatient Hospital Stay: Payer: 59 | Attending: Oncology | Admitting: Oncology

## 2021-08-16 VITALS — BP 105/63 | HR 65 | Temp 99.3°F | Resp 16 | Ht 64.0 in | Wt 222.6 lb

## 2021-08-16 DIAGNOSIS — D696 Thrombocytopenia, unspecified: Secondary | ICD-10-CM | POA: Diagnosis present

## 2021-08-16 DIAGNOSIS — O99111 Other diseases of the blood and blood-forming organs and certain disorders involving the immune mechanism complicating pregnancy, first trimester: Secondary | ICD-10-CM | POA: Diagnosis not present

## 2021-08-16 LAB — CBC
HCT: 41.6 % (ref 36.0–46.0)
Hemoglobin: 13.8 g/dL (ref 12.0–15.0)
MCH: 28.6 pg (ref 26.0–34.0)
MCHC: 33.2 g/dL (ref 30.0–36.0)
MCV: 86.1 fL (ref 80.0–100.0)
Platelets: 138 10*3/uL — ABNORMAL LOW (ref 150–400)
RBC: 4.83 MIL/uL (ref 3.87–5.11)
RDW: 13.3 % (ref 11.5–15.5)
WBC: 8.1 10*3/uL (ref 4.0–10.5)
nRBC: 0 % (ref 0.0–0.2)

## 2021-08-16 LAB — FOLATE: Folate: 27 ng/mL (ref >5.9)

## 2021-08-16 LAB — TECHNOLOGIST SMEAR REVIEW: Plt Morphology: DECREASED

## 2021-08-16 LAB — PROTIME-INR
INR: 1.2 (ref 0.8–1.2)
Prothrombin Time: 14.7 seconds (ref 11.4–15.2)

## 2021-08-16 LAB — VITAMIN B12: Vitamin B-12: 1060 pg/mL — ABNORMAL HIGH (ref 180–914)

## 2021-08-16 LAB — APTT: aPTT: 39 seconds — ABNORMAL HIGH (ref 24–36)

## 2021-08-17 ENCOUNTER — Encounter: Payer: Self-pay | Admitting: Oncology

## 2021-08-17 LAB — VON WILLEBRAND PANEL
Coagulation Factor VIII: 121 % (ref 56–140)
Ristocetin Co-factor, Plasma: 122 % (ref 50–200)
Von Willebrand Antigen, Plasma: 107 % (ref 50–200)

## 2021-08-17 LAB — COAG STUDIES INTERP REPORT

## 2021-08-17 NOTE — Progress Notes (Signed)
Hematology/Oncology Consult note [facility] Telephone:([phone] [phone]  Patient Care Team: Paulene Floor (Inactive) as PCP - General (Physician Assistant) Mosetta Anis, MD as Referring Physician (Allergy) Donnamae Jude, MD as Consulting Physician (Obstetrics and Gynecology) Sindy Guadeloupe, MD as Consulting Physician (Hematology)   Name of the patient: Danielle Sanchez  XN:[id number]  Jul 11, 1985    Reason for referral-thrombocytopenia in pregnancy   Referring physician-Dr. Darron Doom  Date of visit: 08/17/21   History of presenting illness-patient is a 36 year old female currently at 109 weeks of gestation G3 P2 L2.  Her first child is 74 years old.  She had a miscarriage in her second pregnancy at 7 weeks.  Her first pregnancy was otherwise uneventful but she did require blood transfusion after her C-section.  Patient currently referred for thrombocytopenia in pregnancy.When looking back at her prior platelet counts between 2017-2019 they have been normal ranging between 150s to 200s.  Her most recent CBC from 08/03/2021 showed a normal white cell count and hemoglobin but a mild low platelet count of 138.  Present pregnancy is coming along well without any complications.  She presently denies any bleeding or bruising.  Outside of pregnancy she has had occasional nosebleeds.  Patient tells me that she was told that she bled more than usual during her first delivery requiring a blood transfusion.  ECOG PS- 0  Pain scale- 0   Review of systems- Review of Systems  Constitutional:  Negative for chills, fever, malaise/fatigue and weight loss.  HENT:  Negative for congestion, ear discharge and nosebleeds.   Eyes:  Negative for blurred vision.  Respiratory:  Negative for cough, hemoptysis, sputum production, shortness of breath and wheezing.   Cardiovascular:  Negative for chest pain, palpitations, orthopnea and claudication.  Gastrointestinal:   Negative for abdominal pain, blood in stool, constipation, diarrhea, heartburn, melena, nausea and vomiting.  Genitourinary:  Negative for dysuria, flank pain, frequency, hematuria and urgency.  Musculoskeletal:  Negative for back pain, joint pain and myalgias.  Skin:  Negative for rash.  Neurological:  Negative for dizziness, tingling, focal weakness, seizures, weakness and headaches.  Endo/Heme/Allergies:  Does not bruise/bleed easily.  Psychiatric/Behavioral:  Negative for depression and suicidal ideas. The patient does not have insomnia.    Allergies  Allergen Reactions   Promethazine Other (See Comments)    Abdominal pain/dizziness/fatigue   Azithromycin Swelling    Patient Active Problem List   Diagnosis Date Noted   Gestational thrombocytopenia ([place]) 08/04/2021   Previous cesarean delivery affecting pregnancy, antepartum 08/03/2021   Supervision of other normal pregnancy, antepartum 07/11/2021   S/P biliopancreatic diversion with duodenal switch 11/17/2020   Migraine without status migrainosus, not intractable 07/22/2020   Prediabetes 06/14/2018   Morbid obesity ([place]) 06/14/2018   History of gestational hypertension 10/25/2017   Maternal obesity, antepartum 05/28/2017   Elevated prolactin level 05/28/2017     Past Medical History:  Diagnosis Date   Allergy    Anxiety    Gestational thrombocytopenia ([place])    Headache    History of bunionectomy    Medical history non-contributory    PONV (postoperative nausea and vomiting)    Scopolamine patch not helpful. TIVA with propofol on 05/04/21 (with decadron/zofran) - no PONV   Pregnancy induced hypertension    Spontaneous miscarriage 02/03/2021     Past Surgical History:  Procedure Laterality Date   ANTERIOR CRUCIATE LIGAMENT REPAIR Right    bone spur Left    BUNIONECTOMY Right 05/04/2021  Procedure: BUNIONECTOMY- LAPIDUS-TYPE;  Surgeon: Samara Deist, DPM;  Location: [place];  Service: Podiatry;   Laterality: Right;   CESAREAN SECTION N/A 11/01/2017   Procedure: CESAREAN SECTION;  Surgeon: Ward, Honor Loh, MD;  Location: [facility];  Service: Obstetrics;  Laterality: N/A;   DILATION AND CURETTAGE OF UTERUS N/A 02/03/2021   Procedure: DILATATION AND CURETTAGE;  Surgeon: Benjaman Kindler, MD;  Location: [facility];  Service: Gynecology;  Laterality: N/A;   LAPAROSCOPIC GASTRIC RESTRICTIVE DUODENAL PROCEDURE (DUODENAL SWITCH)  10/2020   MENISCUS REPAIR Right    WISDOM TOOTH EXTRACTION  2017    Social History   Socioeconomic History   Marital status: Married    Spouse name: Not on file   Number of children: Not on file   Years of education: Not on file   Highest education level: Not on file  Occupational History   Not on file  Tobacco Use   Smoking status: Never   Smokeless tobacco: Never  Vaping Use   Vaping Use: Never used  Substance and Sexual Activity   Alcohol use: No   Drug use: No   Sexual activity: Yes    Birth control/protection: None  Other Topics Concern   Not on file  Social History Narrative   Not on file   Social Determinants of Health   Financial Resource Strain: Not on file  Food Insecurity: Not on file  Transportation Needs: Not on file  Physical Activity: Not on file  Stress: Not on file  Social Connections: Not on file  Intimate Partner Violence: Not on file     Family History  Problem Relation Age of Onset   Diabetes Maternal Grandmother    Hypertension Maternal Grandmother    Hypotension Mother    Breast cancer Neg Hx      Current Outpatient Medications:    aspirin EC 81 MG tablet, Take 1 tablet (81 mg total) by mouth daily., Disp: 90 tablet, Rfl: 3   ferrous sulfate 325 (65 FE) MG tablet, Take 325 mg by mouth daily with breakfast., Disp: , Rfl:    Multiple Minerals-Vitamins (CAL MAG ZINC +D3 PO), Take 2 tablets by mouth in the morning, at noon, and at bedtime., Disp: , Rfl:    NASCOBAL 500 MCG/0.1ML SOLN, Place 1 spray into both nostrils  every Saturday., Disp: , Rfl:    sertraline (ZOLOFT) 100 MG tablet, Take 100 mg by mouth daily., Disp: , Rfl:    Physical exam:  Vitals:   08/16/21 1038  BP: 105/63  Pulse: 65  Resp: 16  Temp: 99.3 F (37.4 C)  TempSrc: Tympanic  SpO2: 100%  Weight: 222 lb 9.6 oz (101 kg)  Height: 5\' 4"  (1.626 m)   Physical Exam Constitutional:      General: She is not in acute distress. Cardiovascular:     Rate and Rhythm: Normal rate and regular rhythm.     Heart sounds: Normal heart sounds.  Pulmonary:     Effort: Pulmonary effort is normal.     Breath sounds: Normal breath sounds.  Abdominal:     General: Bowel sounds are normal.     Palpations: Abdomen is soft.  Skin:    General: Skin is warm and dry.  Neurological:     Mental Status: She is alert and oriented to person, place, and time.       CMP Latest Ref Rng & Units 08/03/2021  Glucose 70 - 99 mg/dL 77  BUN 6 - 20 mg/dL 7  Creatinine  0.57 - 1.00 mg/dL 0.56(L)  Sodium 134 - 144 mmol/L 137  Potassium 3.5 - 5.2 mmol/L 3.9  Chloride 96 - 106 mmol/L 104  CO2 20 - 29 mmol/L 20  Calcium 8.7 - 10.2 mg/dL 8.8  Total Protein 6.0 - 8.5 g/dL 6.2  Total Bilirubin 0.0 - 1.2 mg/dL 0.3  Alkaline Phos 44 - 121 IU/L 81  AST 0 - 40 IU/L 19  ALT 0 - 32 IU/L 19   CBC Latest Ref Rng & Units 08/16/2021  WBC 4.0 - 10.5 K/uL 8.1  Hemoglobin 12.0 - 15.0 g/dL 13.8  Hematocrit 36.0 - 46.0 % 41.6  Platelets 150 - 400 K/uL 138(L)    No images are attached to the encounter.  No results found.  Assessment and plan- Patient is a 36 y.o. female referred for thrombocytopenia in first trimester of pregnancy  Discussed with the patient that thrombocytopenia especially in the first trimester of pregnancy can be either gestational versus ITP.  Given that patient has had normal platelet counts outside of pregnancy ITP presently appears unlikely.  Usually with gestational thrombocytopenia platelet counts do not drop down to less than 100.  Patient is  already had HIV and hepatitis testing done which was negative.  I will be checking her B12, folate, smear review,PT PTT INR.  Given that she has questionable history of bleeding during her C-section but no increased postpartum bleeding I will check a von Willebrand panel at this time.  In person or video visit with me in 2 weeks time.  My plan for now is to monitor her platelet counts conservatively during her pregnancy.  Intervention may be warranted if platelet counts are less than 50.  Often for neuraxial anesthesia such as epidural platelet counts may be required between 75-100 based on anesthesia preference.  Patient verbalized understanding of the plan   Thank you for this kind referral and the opportunity to participate in the care of this patient   Visit Diagnosis 1. Benign gestational thrombocytopenia in first trimester [facility])     Dr. Randa Evens, MD, MPH [facility] at [facility] [id number] 08/17/2021

## 2021-08-31 ENCOUNTER — Ambulatory Visit (INDEPENDENT_AMBULATORY_CARE_PROVIDER_SITE_OTHER): Payer: 59 | Admitting: Family Medicine

## 2021-08-31 ENCOUNTER — Other Ambulatory Visit: Payer: Self-pay

## 2021-08-31 ENCOUNTER — Inpatient Hospital Stay (HOSPITAL_BASED_OUTPATIENT_CLINIC_OR_DEPARTMENT_OTHER): Payer: 59 | Admitting: Oncology

## 2021-08-31 ENCOUNTER — Encounter: Payer: Self-pay | Admitting: Oncology

## 2021-08-31 VITALS — BP 118/81 | HR 71 | Wt 219.0 lb

## 2021-08-31 DIAGNOSIS — Z3480 Encounter for supervision of other normal pregnancy, unspecified trimester: Secondary | ICD-10-CM

## 2021-08-31 DIAGNOSIS — Z6791 Unspecified blood type, Rh negative: Secondary | ICD-10-CM | POA: Insufficient documentation

## 2021-08-31 DIAGNOSIS — O99111 Other diseases of the blood and blood-forming organs and certain disorders involving the immune mechanism complicating pregnancy, first trimester: Secondary | ICD-10-CM

## 2021-08-31 DIAGNOSIS — O99112 Other diseases of the blood and blood-forming organs and certain disorders involving the immune mechanism complicating pregnancy, second trimester: Secondary | ICD-10-CM

## 2021-08-31 DIAGNOSIS — O34219 Maternal care for unspecified type scar from previous cesarean delivery: Secondary | ICD-10-CM

## 2021-08-31 DIAGNOSIS — D696 Thrombocytopenia, unspecified: Secondary | ICD-10-CM | POA: Diagnosis not present

## 2021-08-31 DIAGNOSIS — O26899 Other specified pregnancy related conditions, unspecified trimester: Secondary | ICD-10-CM

## 2021-08-31 DIAGNOSIS — Z9884 Bariatric surgery status: Secondary | ICD-10-CM

## 2021-08-31 DIAGNOSIS — O99210 Obesity complicating pregnancy, unspecified trimester: Secondary | ICD-10-CM

## 2021-08-31 DIAGNOSIS — Z8759 Personal history of other complications of pregnancy, childbirth and the puerperium: Secondary | ICD-10-CM

## 2021-08-31 NOTE — Progress Notes (Signed)
Pt is nasa sounding.  Would like to have ears checked today ?

## 2021-08-31 NOTE — Progress Notes (Signed)
? ?  PRENATAL VISIT NOTE ? ?Subjective:  ?Danielle Sanchez is a 36 y.o. G3P1011 at [redacted]w[redacted]d being seen today for ongoing prenatal care.  She is currently monitored for the following issues for this low-risk pregnancy and has Maternal obesity, antepartum; Elevated prolactin level; History of gestational hypertension; Prediabetes; Morbid obesity (HCC); Supervision of other normal pregnancy, antepartum; Migraine without status migrainosus, not intractable; S/P biliopancreatic diversion with duodenal switch; Previous cesarean delivery affecting pregnancy, antepartum; Gestational thrombocytopenia (HCC); and Rh negative state in antepartum period on their problem list. ? ?Patient reports no complaints.  Contractions: Not present. Vag. Bleeding: None.   . Denies leaking of fluid.  ? ?The following portions of the patient's history were reviewed and updated as appropriate: allergies, current medications, past family history, past medical history, past social history, past surgical history and problem list.  ? ?Objective:  ? ?Vitals:  ? 08/31/21 1326  ?BP: 118/81  ?Pulse: 71  ?Weight: 219 lb (99.3 kg)  ? ? ?Fetal Status: Fetal Heart Rate (bpm): 149        ? ?General:  Alert, oriented and cooperative. Patient is in no acute distress.  ?Skin: Skin is warm and dry. No rash noted.   ?Cardiovascular: Normal heart rate noted  ?Respiratory: Normal respiratory effort, no problems with respiration noted  ?Abdomen: Soft, gravid, appropriate for gestational age.        ?Pelvic: Cervical exam deferred        ?Extremities: Normal range of motion.     ?Mental Status: Normal mood and affect. Normal behavior. Normal judgment and thought content.  ? ?Assessment and Plan:  ?Pregnancy: G3P1011 at [redacted]w[redacted]d ?1. Benign gestational thrombocytopenia in second trimester [facility]) ?Has seen oncology ? ?2. Supervision of other normal pregnancy, antepartum ?Continue prenatal care. ? ?3. Previous cesarean delivery affecting pregnancy, antepartum ? ? ?4. S/P  biliopancreatic diversion with duodenal switch ?Labs are normal ? ?5. Maternal obesity, antepartum ? ? ?6. History of gestational hypertension ?On ASA ? ?7. Rh negative state in antepartum period ?Will need rhogam ? ?General obstetric precautions including but not limited to vaginal bleeding, contractions, leaking of fluid and fetal movement were reviewed in detail with the patient. ?Please refer to After Visit Summary for other counseling recommendations.  ? ?Return in 4 weeks (on 09/28/2021). ? ?Future Appointments  ?Date Time Provider Department Center  ?08/31/2021  3:15 PM Creig Hines, MD [facility] None  ?09/27/2021 12:45 PM [facility] NURSE [facility]  ?09/27/2021  1:00 PM [facility] US1 [facility]  ?09/28/2021 10:35 AM Reva Bores, MD [facility]  ?10/26/2021 10:35 AM Reva Bores, MD [facility]  ?11/30/2021  8:35 AM Reva Bores, MD [facility]  ? ? ?Reva Bores, MD ? ? ?

## 2021-08-31 NOTE — Patient Instructions (Signed)

## 2021-09-01 ENCOUNTER — Telehealth: Payer: Self-pay | Admitting: *Deleted

## 2021-09-01 ENCOUNTER — Encounter: Payer: Self-pay | Admitting: Family Medicine

## 2021-09-01 NOTE — Telephone Encounter (Signed)
Refilled Zoloft via fax to [facility] ?

## 2021-09-01 NOTE — Progress Notes (Signed)
I connected with Danielle Sanchez on 09/01/21 at  3:15 PM EST by video enabled telemedicine visit and verified that I am speaking with the correct person using two identifiers.   I discussed the limitations, risks, security and privacy concerns of performing an evaluation and management service by telemedicine and the availability of in-person appointments. I also discussed with the patient that there may be a patient responsible charge related to this service. The patient expressed understanding and agreed to proceed.  Other persons participating in the visit and their role in the encounter:  none  Patient's location:  home Provider's location:  work  Stage manager Complaint: Discuss results of blood work  History of present illness: patient is a 36 year old female currently at 13 weeks of gestation G3 P2 L2.  Her first child is 58 years old.  She had a miscarriage in her second pregnancy at 7 weeks.  Her first pregnancy was otherwise uneventful but she did require blood transfusion after her C-section.  Patient currently referred for thrombocytopenia in pregnancy.When looking back at her prior platelet counts between 2017-2019 they have been normal ranging between 150s to 200s.  Her most recent CBC from 08/03/2021 showed a normal white cell count and hemoglobin but a mild low platelet count of 138.  Present pregnancy is coming along well without any complications.  She presently denies any bleeding or bruising.  Outside of pregnancy she has had occasional nosebleeds.  Patient tells me that she was told that she bled more than usual during her first delivery requiring a blood transfusion.   Results of blood work from 08/16/2021 were as follows: CBC showed white count of 8.1, H&H of 13.8/41.6 with a platelet count of 138.  PT/INR was normal and PTT mildly elevated at 39.  B12 levels elevated at 1060.  Folate levels normal.  Von Willebrand panel normal.  Interval history patient is presently at 15 weeks of gestation  and her pregnancy is coming along well without any adverse effects   Review of Systems  Constitutional:  Negative for chills, fever, malaise/fatigue and weight loss.  HENT:  Negative for congestion, ear discharge and nosebleeds.   Eyes:  Negative for blurred vision.  Respiratory:  Negative for cough, hemoptysis, sputum production, shortness of breath and wheezing.   Cardiovascular:  Negative for chest pain, palpitations, orthopnea and claudication.  Gastrointestinal:  Negative for abdominal pain, blood in stool, constipation, diarrhea, heartburn, melena, nausea and vomiting.  Genitourinary:  Negative for dysuria, flank pain, frequency, hematuria and urgency.  Musculoskeletal:  Negative for back pain, joint pain and myalgias.  Skin:  Negative for rash.  Neurological:  Negative for dizziness, tingling, focal weakness, seizures, weakness and headaches.  Endo/Heme/Allergies:  Does not bruise/bleed easily.  Psychiatric/Behavioral:  Negative for depression and suicidal ideas. The patient does not have insomnia.    Allergies  Allergen Reactions   Promethazine Other (See Comments)    Abdominal pain/dizziness/fatigue   Azithromycin Swelling    Past Medical History:  Diagnosis Date   Allergy    Anxiety    Gestational thrombocytopenia (HCC)    Headache    History of bunionectomy    Medical history non-contributory    PONV (postoperative nausea and vomiting)    Scopolamine patch not helpful. TIVA with propofol on 05/04/21 (with decadron/zofran) - no PONV   Pregnancy induced hypertension    Spontaneous miscarriage 02/03/2021    Past Surgical History:  Procedure Laterality Date   ANTERIOR CRUCIATE LIGAMENT REPAIR Right    bone spur  Left    BUNIONECTOMY Right 05/04/2021   Procedure: BUNIONECTOMY- LAPIDUS-TYPE;  Surgeon: Gwyneth Revels, DPM;  [facility];  Service: Podiatry;  Laterality: Right;   CESAREAN SECTION N/A 11/01/2017   Procedure: CESAREAN SECTION;  Surgeon:  Ward, Elenora Fender, MD;  Location: [facility];  Service: Obstetrics;  Laterality: N/A;   DILATION AND CURETTAGE OF UTERUS N/A 02/03/2021   Procedure: DILATATION AND CURETTAGE;  Surgeon: Christeen Douglas, MD;  Location: [facility];  Service: Gynecology;  Laterality: N/A;   LAPAROSCOPIC GASTRIC RESTRICTIVE DUODENAL PROCEDURE (DUODENAL SWITCH)  10/2020   MENISCUS REPAIR Right    WISDOM TOOTH EXTRACTION  2017    Social History   Socioeconomic History   Marital status: Married    Spouse name: Not on file   Number of children: Not on file   Years of education: Not on file   Highest education level: Not on file  Occupational History   Not on file  Tobacco Use   Smoking status: Never   Smokeless tobacco: Never  Vaping Use   Vaping Use: Never used  Substance and Sexual Activity   Alcohol use: No   Drug use: No   Sexual activity: Yes    Birth control/protection: None  Other Topics Concern   Not on file  Social History Narrative   Not on file   Social Determinants of Health   Financial Resource Strain: Not on file  Food Insecurity: Not on file  Transportation Needs: Not on file  Physical Activity: Not on file  Stress: Not on file  Social Connections: Not on file  Intimate Partner Violence: Not on file    Family History  Problem Relation Age of Onset   Diabetes Maternal Grandmother    Hypertension Maternal Grandmother    Hypotension Mother    Breast cancer Neg Hx      Current Outpatient Medications:    aspirin EC 81 MG tablet, Take 1 tablet (81 mg total) by mouth daily., Disp: 90 tablet, Rfl: 3   ferrous sulfate 325 (65 FE) MG tablet, Take 325 mg by mouth daily with breakfast., Disp: , Rfl:    Multiple Minerals-Vitamins (CAL MAG ZINC +D3 PO), Take 2 tablets by mouth in the morning, at noon, and at bedtime., Disp: , Rfl:    NASCOBAL 500 MCG/0.1ML SOLN, Place 1 spray into both nostrils every Saturday., Disp: , Rfl:    sertraline (ZOLOFT) 100 MG tablet, Take 100 mg by mouth  daily., Disp: , Rfl:   No results found.  No images are attached to the encounter.   CMP Latest Ref Rng & Units 08/03/2021  Glucose 70 - 99 mg/dL 77  BUN 6 - 20 mg/dL 7  Creatinine 6.75 - 4.49 mg/dL 2.01(E)  Sodium 071 - 219 mmol/L 137  Potassium 3.5 - 5.2 mmol/L 3.9  Chloride 96 - 106 mmol/L 104  CO2 20 - 29 mmol/L 20  Calcium 8.7 - 10.2 mg/dL 8.8  Total Protein 6.0 - 8.5 g/dL 6.2  Total Bilirubin 0.0 - 1.2 mg/dL 0.3  Alkaline Phos 44 - 121 IU/L 81  AST 0 - 40 IU/L 19  ALT 0 - 32 IU/L 19   CBC Latest Ref Rng & Units 08/16/2021  WBC 4.0 - 10.5 K/uL 8.1  Hemoglobin 12.0 - 15.0 g/dL 75.8  Hematocrit 83.2 - 46.0 % 41.6  Platelets 150 - 400 K/uL 138(L)     Observation/objective: Appears in no acute distress over video visit today.  Breathing is nonlabored  Assessment and  plan:Patient is a 36 year old female referred for thrombocytopenia in pregnancy likely gestational thrombocytopenia  Patient's platelet counts outside of her pregnancy have been between 150s to 200s.  I therefore suspect that she has benign gestational thrombocytopenia which typically does not drop down to less than 100.  We will continue to monitor her platelet counts during her pregnancy and likely more closely as she comes close to delivery.  Treatment for thrombocytopenia is not indicated unless platelet counts are less than 100 if there is need for neuraxial anesthesia.  Patient had reported some increased bleeding after her first C-section and therefore I have checked her von Willebrand panel which is presently normal.  Discussed with the patient that checking von Willebrand panel to rule out von Willebrand's disease is not entirely accurate during pregnancy as levels could be falsely normal.  However normal levels presently are reassuring nevertheless.  Follow-up instructions: Repeat CBC with differential in 2 months and I will see her thereafter  I discussed the assessment and treatment plan with the patient.  The patient was provided an opportunity to ask questions and all were answered. The patient agreed with the plan and demonstrated an understanding of the instructions.   The patient was advised to call back or seek an in-person evaluation if the symptoms worsen or if the condition fails to improve as anticipated.   Visit Diagnosis: 1. Benign gestational thrombocytopenia in first trimester [facility])     Dr. Owens Shark, MD, MPH [facility] at [facility] Tel- [phone] 09/01/2021 9:10 AM

## 2021-09-02 ENCOUNTER — Ambulatory Visit
Admission: RE | Admit: 2021-09-02 | Discharge: 2021-09-02 | Disposition: A | Discharge status: Home / Self Care | Payer: 59 | Source: Ambulatory Visit | Attending: Urgent Care | Admitting: Urgent Care

## 2021-09-02 ENCOUNTER — Other Ambulatory Visit: Payer: Self-pay

## 2021-09-02 VITALS — BP 98/65 | HR 72 | Temp 99.4°F | Resp 20

## 2021-09-02 DIAGNOSIS — J020 Streptococcal pharyngitis: Secondary | ICD-10-CM | POA: Diagnosis not present

## 2021-09-02 LAB — POCT RAPID STREP A (OFFICE): Rapid Strep A Screen: POSITIVE — AB

## 2021-09-02 MED ORDER — AMOXICILLIN 500 MG PO CAPS: 500.0000 mg | ORAL_CAPSULE | Freq: Two times a day (BID) | ORAL | 0 refills | Status: AC

## 2021-09-02 NOTE — Discharge Instructions (Addendum)
Your rapid strep was positive for strep throat. ?Please start taking antibiotics as prescribed. Do not stop taking them until all has been completed. ?After completing the third day of antibiotics, throw away your current toothbrush and get a new one. This will prevent re-contamination. ? ?Do not share food, beverages, or kiss on the lips until >48 hours after starting antibiotics and >24 hours after fever resolved. This is contagious. ?   ?You can take up to 1000mg  Tylenol three times daily, or 3g in a 24 hour period. ? ?Please avoid sudafed or decongestant use. Steam or warm mist vaporizers are preferred. Saline nasal sprays or OTC budesonide/ rhinoquart is the preferred steroid nasal spray for pregnancy. ?

## 2021-09-02 NOTE — ED Provider Notes (Signed)
Renaldo Fiddler    CSN: [id number] Arrival date & time: 09/02/21  0800      History   Chief Complaint Chief Complaint  Patient presents with   Sore Throat    HPI NYEEMAH RIEFF is a 36 y.o. female.   Pleasant 35yo female presents with a one week hx of nasal congestion and dry cough, and a 2 day hx of severe sore throat. She states her 4yo son was dx with strep throat yesterday at his pediatrician office. She denies any GI sx or rash. No known fever. Has been taking OTC tylenol and claritin-D. Pt is [redacted] weeks pregnant. No additional concerns or complaints.   Sore Throat   Past Medical History:  Diagnosis Date   Allergy    Anxiety    Gestational thrombocytopenia (HCC)    Headache    History of bunionectomy    Medical history non-contributory    PONV (postoperative nausea and vomiting)    Scopolamine patch not helpful. TIVA with propofol on 05/04/21 (with decadron/zofran) - no PONV   Pregnancy induced hypertension    Spontaneous miscarriage 02/03/2021    Patient Active Problem List   Diagnosis Date Noted   Rh negative state in antepartum period 08/31/2021   Gestational thrombocytopenia (HCC) 08/04/2021   Previous cesarean delivery affecting pregnancy, antepartum 08/03/2021   Supervision of other normal pregnancy, antepartum 07/11/2021   S/P biliopancreatic diversion with duodenal switch 11/17/2020   Migraine without status migrainosus, not intractable 07/22/2020   Prediabetes 06/14/2018   Morbid obesity (HCC) 06/14/2018   History of gestational hypertension 10/25/2017   Maternal obesity, antepartum 05/28/2017   Elevated prolactin level 05/28/2017    Past Surgical History:  Procedure Laterality Date   ANTERIOR CRUCIATE LIGAMENT REPAIR Right    bone spur Left    BUNIONECTOMY Right 05/04/2021   Procedure: BUNIONECTOMY- LAPIDUS-TYPE;  Surgeon: Gwyneth Revels, DPM;  Location: [facility];  Service: Podiatry;  Laterality: Right;   CESAREAN SECTION N/A  11/01/2017   Procedure: CESAREAN SECTION;  Surgeon: Ward, Elenora Fender, MD;  Location: [facility];  Service: Obstetrics;  Laterality: N/A;   DILATION AND CURETTAGE OF UTERUS N/A 02/03/2021   Procedure: DILATATION AND CURETTAGE;  Surgeon: Christeen Douglas, MD;  Location: [facility];  Service: Gynecology;  Laterality: N/A;   LAPAROSCOPIC GASTRIC RESTRICTIVE DUODENAL PROCEDURE (DUODENAL SWITCH)  10/2020   MENISCUS REPAIR Right    WISDOM TOOTH EXTRACTION  2017    OB History     Gravida  3   Para  1   Term  1   Preterm      AB  1   Living  1      SAB  1   IAB      Ectopic      Multiple  0   Live Births  1            Home Medications    Prior to Admission medications   Medication Sig Start Date End Date Taking? Authorizing Provider  amoxicillin (AMOXIL) 500 MG capsule Take 1 capsule (500 mg total) by mouth 2 (two) times daily for 10 days. 09/02/21 09/12/21 Yes Jissell Trafton, Whitney L, PA  aspirin EC 81 MG tablet Take 1 tablet (81 mg total) by mouth daily. 08/03/21   Reva Bores, MD  ferrous sulfate 325 (65 FE) MG tablet Take 325 mg by mouth daily with breakfast.    [provider]  Multiple Minerals-Vitamins (CAL MAG ZINC +D3 PO) Take 2 tablets by  mouth in the morning, at noon, and at bedtime.    [provider]  NASCOBAL 500 MCG/0.1ML SOLN Place 1 spray into both nostrils every Saturday. 01/26/21   [provider]  sertraline (ZOLOFT) 100 MG tablet Take 100 mg by mouth daily.    [provider]    Family History Family History  Problem Relation Age of Onset   Diabetes Maternal Grandmother    Hypertension Maternal Grandmother    Hypotension Mother    Breast cancer Neg Hx     Social History Social History   Tobacco Use   Smoking status: Never   Smokeless tobacco: Never  Vaping Use   Vaping Use: Never used  Substance Use Topics   Alcohol use: No   Drug use: No     Allergies   Promethazine and Azithromycin   Review of  Systems Review of Systems  HENT:  Positive for congestion, rhinorrhea and sore throat.   Respiratory:  Positive for cough.     Physical Exam Triage Vital Signs ED Triage Vitals  Enc Vitals Group     BP 09/02/21 0819 98/65     Pulse Rate 09/02/21 0819 72     Resp 09/02/21 0819 20     Temp 09/02/21 0819 99.4 F (37.4 C)     Temp src --      SpO2 09/02/21 0819 98 %     Weight --      Height --      Head Circumference --      Peak Flow --      Pain Score 09/02/21 0820 6     Pain Loc --      Pain Edu? --      Excl. in GC? --    No data found.  Updated Vital Signs BP 98/65   Pulse 72   Temp 99.4 F (37.4 C)   Resp 20   LMP 05/17/2021   SpO2 98%   Visual Acuity Right Eye Distance:   Left Eye Distance:   Bilateral Distance:    Right Eye Near:   Left Eye Near:    Bilateral Near:     Physical Exam Vitals and nursing note reviewed.  Constitutional:      General: She is not in acute distress.    Appearance: She is well-developed. She is obese. She is not ill-appearing, toxic-appearing or diaphoretic.  HENT:     Head: Normocephalic and atraumatic.     Right Ear: Tympanic membrane and ear canal normal. No drainage, swelling or tenderness. No middle ear effusion. Tympanic membrane is not erythematous.     Left Ear: Tympanic membrane and ear canal normal. No drainage, swelling or tenderness.  No middle ear effusion. Tympanic membrane is not erythematous.     Nose: No congestion or rhinorrhea.     Mouth/Throat:     Mouth: Mucous membranes are moist.     Pharynx: Oropharyngeal exudate and posterior oropharyngeal erythema present. No pharyngeal swelling or uvula swelling.     Tonsils: No tonsillar exudate.  Eyes:     Conjunctiva/sclera: Conjunctivae normal.     Pupils: Pupils are equal, round, and reactive to light.  Neck:     Thyroid: No thyromegaly.  Cardiovascular:     Rate and Rhythm: Normal rate and regular rhythm.     Heart sounds: Normal heart sounds. No murmur  heard.   No friction rub. No gallop.  Pulmonary:     Effort: Pulmonary effort is normal. No respiratory  distress.     Breath sounds: Normal breath sounds. No stridor. No wheezing, rhonchi or rales.  Chest:     Chest wall: No tenderness.  Abdominal:     General: There is no distension.     Palpations: Abdomen is soft.     Tenderness: There is no abdominal tenderness. There is no rebound.  Musculoskeletal:     Cervical back: Normal range of motion and neck supple.  Lymphadenopathy:     Cervical: No cervical adenopathy.  Skin:    General: Skin is warm.     Capillary Refill: Capillary refill takes less than 2 seconds.  Neurological:     General: No focal deficit present.     Mental Status: She is alert and oriented to person, place, and time.  Psychiatric:        Mood and Affect: Mood normal.     UC Treatments / Results  Labs (all labs ordered are listed, but only abnormal results are displayed) Labs Reviewed  POCT RAPID STREP A (OFFICE) - Abnormal; Notable for the following components:      Result Value   Rapid Strep A Screen Positive (*)    All other components within normal limits    EKG   Radiology No results found.  Procedures Procedures (including critical care time)  Medications Ordered in UC Medications - No data to display  Initial Impression / Assessment and Plan / UC Course  I have reviewed the triage vital signs and the nursing notes.  Pertinent labs & imaging results that were available during my care of the patient were reviewed by me and considered in my medical decision making (see chart for details).     Strep sore throat - Start amoxicillin x 10 days. Limit close contact with others. Tylenol as needed for fever or headaches. Nasal congestion - due to pregnancy, please avoid decongestants. Nasal saline, humidification, and rhinocort nasal spray are the preferred treatments. F/U with OB for further recommendations.  Final Clinical Impressions(s) /  UC Diagnoses   Final diagnoses:  Streptococcal sore throat     Discharge Instructions      Your rapid strep was positive for strep throat. Please start taking antibiotics as prescribed. Do not stop taking them until all has been completed. After completing the third day of antibiotics, throw away your current toothbrush and get a new one. This will prevent re-contamination.  Do not share food, beverages, or kiss on the lips until >48 hours after starting antibiotics and >24 hours after fever resolved. This is contagious.    You can take up to 1000mg  Tylenol three times daily, or 3g in a 24 hour period.  Please avoid sudafed or decongestant use. Steam or warm mist vaporizers are preferred. Saline nasal sprays or OTC budesonide/ rhinoquart is the preferred steroid nasal spray for pregnancy.   ED Prescriptions     Medication Sig Dispense Auth. Provider   amoxicillin (AMOXIL) 500 MG capsule Take 1 capsule (500 mg total) by mouth 2 (two) times daily for 10 days. 20 capsule Deyjah Kindel, Whitney L, PA      PDMP not reviewed this encounter.   Maretta Bees, [place] 09/02/21 [phone]

## 2021-09-02 NOTE — ED Triage Notes (Signed)
Pt here with sore throat x 4 days that is getting worse. States her son just tested positive for strep yesterday.  ?

## 2021-09-04 ENCOUNTER — Encounter: Payer: Self-pay | Admitting: Oncology

## 2021-09-27 ENCOUNTER — Other Ambulatory Visit: Payer: Self-pay | Admitting: *Deleted

## 2021-09-27 ENCOUNTER — Encounter: Payer: Self-pay | Admitting: *Deleted

## 2021-09-27 ENCOUNTER — Ambulatory Visit: Payer: 59 | Attending: Family Medicine

## 2021-09-27 ENCOUNTER — Other Ambulatory Visit: Payer: Self-pay

## 2021-09-27 ENCOUNTER — Ambulatory Visit: Payer: 59 | Admitting: *Deleted

## 2021-09-27 VITALS — BP 107/63 | HR 58

## 2021-09-27 DIAGNOSIS — O99212 Obesity complicating pregnancy, second trimester: Secondary | ICD-10-CM | POA: Insufficient documentation

## 2021-09-27 DIAGNOSIS — Z6791 Unspecified blood type, Rh negative: Secondary | ICD-10-CM | POA: Diagnosis not present

## 2021-09-27 DIAGNOSIS — O321XX0 Maternal care for breech presentation, not applicable or unspecified: Secondary | ICD-10-CM | POA: Diagnosis not present

## 2021-09-27 DIAGNOSIS — Z3A19 19 weeks gestation of pregnancy: Secondary | ICD-10-CM | POA: Diagnosis not present

## 2021-09-27 DIAGNOSIS — Z363 Encounter for antenatal screening for malformations: Secondary | ICD-10-CM | POA: Diagnosis present

## 2021-09-27 DIAGNOSIS — O09292 Supervision of pregnancy with other poor reproductive or obstetric history, second trimester: Secondary | ICD-10-CM | POA: Diagnosis not present

## 2021-09-27 DIAGNOSIS — O26899 Other specified pregnancy related conditions, unspecified trimester: Secondary | ICD-10-CM | POA: Insufficient documentation

## 2021-09-27 DIAGNOSIS — O26892 Other specified pregnancy related conditions, second trimester: Secondary | ICD-10-CM | POA: Insufficient documentation

## 2021-09-27 DIAGNOSIS — Z8759 Personal history of other complications of pregnancy, childbirth and the puerperium: Secondary | ICD-10-CM

## 2021-09-27 DIAGNOSIS — O34219 Maternal care for unspecified type scar from previous cesarean delivery: Secondary | ICD-10-CM | POA: Diagnosis not present

## 2021-09-27 DIAGNOSIS — Z3480 Encounter for supervision of other normal pregnancy, unspecified trimester: Secondary | ICD-10-CM

## 2021-09-28 ENCOUNTER — Ambulatory Visit (INDEPENDENT_AMBULATORY_CARE_PROVIDER_SITE_OTHER): Payer: 59 | Admitting: Family Medicine

## 2021-09-28 VITALS — BP 107/72 | HR 61 | Wt 222.4 lb

## 2021-09-28 DIAGNOSIS — Z9884 Bariatric surgery status: Secondary | ICD-10-CM

## 2021-09-28 DIAGNOSIS — Z6791 Unspecified blood type, Rh negative: Secondary | ICD-10-CM

## 2021-09-28 DIAGNOSIS — Z8759 Personal history of other complications of pregnancy, childbirth and the puerperium: Secondary | ICD-10-CM

## 2021-09-28 DIAGNOSIS — O26899 Other specified pregnancy related conditions, unspecified trimester: Secondary | ICD-10-CM

## 2021-09-28 DIAGNOSIS — Z3480 Encounter for supervision of other normal pregnancy, unspecified trimester: Secondary | ICD-10-CM

## 2021-09-28 DIAGNOSIS — O34219 Maternal care for unspecified type scar from previous cesarean delivery: Secondary | ICD-10-CM

## 2021-09-28 NOTE — Progress Notes (Signed)
? ?  PRENATAL VISIT NOTE ? ?Subjective:  ?Danielle Sanchez is a 36 y.o. G3P1011 at [redacted]w[redacted]d being seen today for ongoing prenatal care.  She is currently monitored for the following issues for this low-risk pregnancy and has Maternal obesity, antepartum; Elevated prolactin level; History of gestational hypertension; Prediabetes; Morbid obesity (HCC); Supervision of other normal pregnancy, antepartum; Migraine without status migrainosus, not intractable; S/P biliopancreatic diversion with duodenal switch; Previous cesarean delivery affecting pregnancy, antepartum; Gestational thrombocytopenia (HCC); and Rh negative state in antepartum period on their problem list. ? ?Patient reports no complaints.  Contractions: Not present. Vag. Bleeding: None.  Movement: Present. Denies leaking of fluid.  ? ?The following portions of the patient's history were reviewed and updated as appropriate: allergies, current medications, past family history, past medical history, past social history, past surgical history and problem list.  ? ?Objective:  ? ?Vitals:  ? 09/28/21 1051  ?BP: 107/72  ?Pulse: 61  ?Weight: 222 lb 6.4 oz (100.9 kg)  ? ? ?Fetal Status: Fetal Heart Rate (bpm): 141   Movement: Present    ? ?General:  Alert, oriented and cooperative. Patient is in no acute distress.  ?Skin: Skin is warm and dry. No rash noted.   ?Cardiovascular: Normal heart rate noted  ?Respiratory: Normal respiratory effort, no problems with respiration noted  ?Abdomen: Soft, gravid, appropriate for gestational age.  Pain/Pressure: Absent     ?Pelvic: Cervical exam deferred        ?Extremities: Normal range of motion.  Edema: None  ?Mental Status: Normal mood and affect. Normal behavior. Normal judgment and thought content.  ? ?Assessment and Plan:  ?Pregnancy: G3P1011 at [redacted]w[redacted]d ?1. History of gestational hypertension ?On ASA ? ?2. Supervision of other normal pregnancy, antepartum ?Has normal anatomy-->incomplete needs f/u--scheduled ? ?3. Previous  cesarean delivery affecting pregnancy, antepartum ? ? ?4. Rh negative state in antepartum period ?Rhogam at 28 weeks and pp if indicated ? ?5. S/P biliopancreatic diversion with duodenal switch ?Serial growth scans ? ?Preterm labor symptoms and general obstetric precautions including but not limited to vaginal bleeding, contractions, leaking of fluid and fetal movement were reviewed in detail with the patient. ?Please refer to After Visit Summary for other counseling recommendations.  ? ?Return in 4 weeks (on 10/26/2021). ? ?Future Appointments  ?Date Time Provider Department Center  ?10/26/2021 10:35 AM Reva Bores, MD [facility]  ?10/27/2021 10:15 AM [facility] NURSE [facility]  ?10/27/2021 11:30 AM [facility] US3 [facility]  ?11/30/2021  8:35 AM Reva Bores, MD [facility]  ? ? ?Reva Bores, MD ? ?

## 2021-09-28 NOTE — Patient Instructions (Signed)

## 2021-10-05 ENCOUNTER — Encounter: Payer: Self-pay | Admitting: Family Medicine

## 2021-10-09 ENCOUNTER — Encounter: Payer: Self-pay | Admitting: Family Medicine

## 2021-10-10 ENCOUNTER — Ambulatory Visit (INDEPENDENT_AMBULATORY_CARE_PROVIDER_SITE_OTHER): Payer: 59

## 2021-10-10 DIAGNOSIS — Z3492 Encounter for supervision of normal pregnancy, unspecified, second trimester: Secondary | ICD-10-CM

## 2021-10-10 NOTE — Progress Notes (Signed)
[redacted]w[redacted]d ROB patient presents for Fetal Heart rate check for assurance.  ? ?FHT : 140  ? ?Pt advised Fetal movement will increase as baby grows. ?Pt made aware she can contact the office with any questions or concerns.  ?Patient agreeable and voiced understanding.  ? ? ?

## 2021-10-26 ENCOUNTER — Encounter: Payer: 59 | Admitting: Family Medicine

## 2021-10-27 ENCOUNTER — Other Ambulatory Visit: Payer: Self-pay | Admitting: *Deleted

## 2021-10-27 ENCOUNTER — Ambulatory Visit: Payer: 59 | Attending: Obstetrics

## 2021-10-27 ENCOUNTER — Ambulatory Visit: Payer: 59 | Admitting: *Deleted

## 2021-10-27 ENCOUNTER — Encounter: Payer: Self-pay | Admitting: *Deleted

## 2021-10-27 VITALS — BP 115/60 | HR 59

## 2021-10-27 DIAGNOSIS — O34219 Maternal care for unspecified type scar from previous cesarean delivery: Secondary | ICD-10-CM | POA: Diagnosis not present

## 2021-10-27 DIAGNOSIS — O26892 Other specified pregnancy related conditions, second trimester: Secondary | ICD-10-CM | POA: Insufficient documentation

## 2021-10-27 DIAGNOSIS — E669 Obesity, unspecified: Secondary | ICD-10-CM

## 2021-10-27 DIAGNOSIS — Z8759 Personal history of other complications of pregnancy, childbirth and the puerperium: Secondary | ICD-10-CM

## 2021-10-27 DIAGNOSIS — Z6791 Unspecified blood type, Rh negative: Secondary | ICD-10-CM | POA: Insufficient documentation

## 2021-10-27 DIAGNOSIS — O360190 Maternal care for anti-D [Rh] antibodies, unspecified trimester, not applicable or unspecified: Secondary | ICD-10-CM

## 2021-10-27 DIAGNOSIS — Z9884 Bariatric surgery status: Secondary | ICD-10-CM

## 2021-10-27 DIAGNOSIS — Z362 Encounter for other antenatal screening follow-up: Secondary | ICD-10-CM | POA: Insufficient documentation

## 2021-10-27 DIAGNOSIS — O99212 Obesity complicating pregnancy, second trimester: Secondary | ICD-10-CM | POA: Insufficient documentation

## 2021-10-27 DIAGNOSIS — Z3A23 23 weeks gestation of pregnancy: Secondary | ICD-10-CM | POA: Insufficient documentation

## 2021-10-27 DIAGNOSIS — D696 Thrombocytopenia, unspecified: Secondary | ICD-10-CM

## 2021-10-27 DIAGNOSIS — Z3480 Encounter for supervision of other normal pregnancy, unspecified trimester: Secondary | ICD-10-CM

## 2021-10-27 DIAGNOSIS — O26899 Other specified pregnancy related conditions, unspecified trimester: Secondary | ICD-10-CM

## 2021-10-27 DIAGNOSIS — O09292 Supervision of pregnancy with other poor reproductive or obstetric history, second trimester: Secondary | ICD-10-CM | POA: Diagnosis not present

## 2021-10-27 DIAGNOSIS — O0992 Supervision of high risk pregnancy, unspecified, second trimester: Secondary | ICD-10-CM

## 2021-10-27 DIAGNOSIS — O99842 Bariatric surgery status complicating pregnancy, second trimester: Secondary | ICD-10-CM | POA: Diagnosis not present

## 2021-11-02 ENCOUNTER — Ambulatory Visit (INDEPENDENT_AMBULATORY_CARE_PROVIDER_SITE_OTHER): Payer: 59 | Admitting: Family Medicine

## 2021-11-02 VITALS — BP 122/81 | HR 62 | Wt 226.0 lb

## 2021-11-02 DIAGNOSIS — O26892 Other specified pregnancy related conditions, second trimester: Secondary | ICD-10-CM

## 2021-11-02 DIAGNOSIS — Z3480 Encounter for supervision of other normal pregnancy, unspecified trimester: Secondary | ICD-10-CM

## 2021-11-02 DIAGNOSIS — R7303 Prediabetes: Secondary | ICD-10-CM

## 2021-11-02 DIAGNOSIS — Z3A24 24 weeks gestation of pregnancy: Secondary | ICD-10-CM

## 2021-11-02 DIAGNOSIS — O99210 Obesity complicating pregnancy, unspecified trimester: Secondary | ICD-10-CM

## 2021-11-02 DIAGNOSIS — O360920 Maternal care for other rhesus isoimmunization, second trimester, not applicable or unspecified: Secondary | ICD-10-CM

## 2021-11-02 DIAGNOSIS — O34219 Maternal care for unspecified type scar from previous cesarean delivery: Secondary | ICD-10-CM

## 2021-11-02 DIAGNOSIS — O26899 Other specified pregnancy related conditions, unspecified trimester: Secondary | ICD-10-CM

## 2021-11-02 NOTE — Progress Notes (Signed)
? ?  PRENATAL VISIT NOTE ? ?Subjective:  ?Danielle Sanchez is a 36 y.o. G3P1011 at [redacted]w[redacted]d being seen today for ongoing prenatal care.  She is currently monitored for the following issues for this high-risk pregnancy and has Maternal obesity, antepartum; Elevated prolactin level; History of gestational hypertension; Prediabetes; Morbid obesity ([place]); Supervision of other normal pregnancy, antepartum; Migraine without status migrainosus, not intractable; S/P biliopancreatic diversion with duodenal switch; Previous cesarean delivery affecting pregnancy, antepartum; Gestational thrombocytopenia ([place]); and Rh negative state in antepartum period on their problem list. ? ?Patient reports no complaints.  Contractions: Not present. Vag. Bleeding: None.  Movement: Present. Denies leaking of fluid.  ? ?The following portions of the patient's history were reviewed and updated as appropriate: allergies, current medications, past family history, past medical history, past social history, past surgical history and problem list.  ? ?Objective:  ? ?Vitals:  ? 11/02/21 1324  ?BP: 122/81  ?Pulse: 62  ?Weight: 226 lb (102.5 kg)  ? ? ?Fetal Status: Fetal Heart Rate (bpm): 141 Fundal Height: 26 cm Movement: Present    ? ?General:  Alert, oriented and cooperative. Patient is in no acute distress.  ?Skin: Skin is warm and dry. No rash noted.   ?Cardiovascular: Normal heart rate noted  ?Respiratory: Normal respiratory effort, no problems with respiration noted  ?Abdomen: Soft, gravid, appropriate for gestational age.  Pain/Pressure: Absent     ?Pelvic: Cervical exam deferred        ?Extremities: Normal range of motion.  Edema: None  ?Mental Status: Normal mood and affect. Normal behavior. Normal judgment and thought content.  ? ?Assessment and Plan:  ?Pregnancy: G3P1011 at [redacted]w[redacted]d ?1. Previous cesarean delivery affecting pregnancy, antepartum ?To discuss options as we get closer to delivery ? ?2. Rh negative state in antepartum period ?Will need  rhogam at 28 wks and pp if indicated ? ?3. Supervision of other normal pregnancy, antepartum ?Continue routine prenatal care. ?28 wk labs next visit ? ?4. Maternal obesity, antepartum ?TWG 4 lbs ?Reviewed goals ? ?5. Prediabetes ? ? ?Preterm labor symptoms and general obstetric precautions including but not limited to vaginal bleeding, contractions, leaking of fluid and fetal movement were reviewed in detail with the patient. ?Please refer to After Visit Summary for other counseling recommendations.  ? ?Return in 4 weeks (on 11/30/2021). ? ?Future Appointments  ?Date Time Provider [place]  ?11/30/2021  8:35 AM Donnamae Jude, MD [facility]  ?12/01/2021  8:30 AM [facility] NURSE [facility]  ?12/01/2021  8:45 AM [facility] US4 [facility]  ?12/14/2021  9:55 AM Aletha Halim, MD [facility]  ?12/28/2021 10:55 AM Anyanwu, Sallyanne Havers, MD [facility]  ?01/11/2022 10:35 AM Donnamae Jude, MD [facility]  ?01/25/2022 10:55 AM Donnamae Jude, MD [facility]  ? ? ?Donnamae Jude, MD ? ?

## 2021-11-11 ENCOUNTER — Encounter: Payer: Self-pay | Admitting: Family Medicine

## 2021-11-30 ENCOUNTER — Ambulatory Visit (INDEPENDENT_AMBULATORY_CARE_PROVIDER_SITE_OTHER): Payer: 59 | Admitting: Family Medicine

## 2021-11-30 ENCOUNTER — Encounter: Payer: Self-pay | Admitting: Family Medicine

## 2021-11-30 VITALS — BP 115/75 | HR 65 | Wt 225.0 lb

## 2021-11-30 DIAGNOSIS — O99113 Other diseases of the blood and blood-forming organs and certain disorders involving the immune mechanism complicating pregnancy, third trimester: Secondary | ICD-10-CM

## 2021-11-30 DIAGNOSIS — O360930 Maternal care for other rhesus isoimmunization, third trimester, not applicable or unspecified: Secondary | ICD-10-CM

## 2021-11-30 DIAGNOSIS — Z23 Encounter for immunization: Secondary | ICD-10-CM

## 2021-11-30 DIAGNOSIS — Z3480 Encounter for supervision of other normal pregnancy, unspecified trimester: Secondary | ICD-10-CM

## 2021-11-30 DIAGNOSIS — Z8759 Personal history of other complications of pregnancy, childbirth and the puerperium: Secondary | ICD-10-CM

## 2021-11-30 DIAGNOSIS — F411 Generalized anxiety disorder: Secondary | ICD-10-CM

## 2021-11-30 DIAGNOSIS — Z6791 Unspecified blood type, Rh negative: Secondary | ICD-10-CM | POA: Diagnosis not present

## 2021-11-30 DIAGNOSIS — Z3A28 28 weeks gestation of pregnancy: Secondary | ICD-10-CM | POA: Diagnosis not present

## 2021-11-30 DIAGNOSIS — O26899 Other specified pregnancy related conditions, unspecified trimester: Secondary | ICD-10-CM

## 2021-11-30 DIAGNOSIS — O34219 Maternal care for unspecified type scar from previous cesarean delivery: Secondary | ICD-10-CM

## 2021-11-30 DIAGNOSIS — D696 Thrombocytopenia, unspecified: Secondary | ICD-10-CM

## 2021-11-30 MED ORDER — RHO D IMMUNE GLOBULIN 1500 UNIT/2ML IJ SOSY
300.0000 ug | PREFILLED_SYRINGE | Freq: Once | INTRAMUSCULAR | Status: AC
  Administered 2021-11-30: 300 ug via INTRAMUSCULAR

## 2021-11-30 NOTE — Progress Notes (Signed)
   PRENATAL VISIT NOTE  Subjective:  Danielle Sanchez is a 36 y.o. G3P1011 at [redacted]w[redacted]d being seen today for ongoing prenatal care.  She is currently monitored for the following issues for this high-risk pregnancy and has Maternal obesity, antepartum; Elevated prolactin level; History of gestational hypertension; Prediabetes; Morbid obesity ([place]); Supervision of other normal pregnancy, antepartum; Migraine without status migrainosus, not intractable; S/P biliopancreatic diversion with duodenal switch; Previous cesarean delivery affecting pregnancy, antepartum; Gestational thrombocytopenia ([place]); Rh negative state in antepartum period; and Generalized anxiety disorder on their problem list.  Patient reports backache and sciatica pain on left .  Contractions: Not present. Vag. Bleeding: None.  Movement: Present. Denies leaking of fluid.   The following portions of the patient's history were reviewed and updated as appropriate: allergies, current medications, past family history, past medical history, past social history, past surgical history and problem list.   Objective:   Vitals:   11/30/21 0848  BP: 115/75  Pulse: 65  Weight: 225 lb (102.1 kg)    Fetal Status: Fetal Heart Rate (bpm): 142   Movement: Present     General:  Alert, oriented and cooperative. Patient is in no acute distress.  Skin: Skin is warm and dry. No rash noted.   Cardiovascular: Normal heart rate noted  Respiratory: Normal respiratory effort, no problems with respiration noted  Abdomen: Soft, gravid, appropriate for gestational age.  Pain/Pressure: Absent     Pelvic: Cervical exam deferred        Extremities: Normal range of motion.  Edema: None  Mental Status: Normal mood and affect. Normal behavior. Normal judgment and thought content.   Assessment and Plan:  Pregnancy: G3P1011 at [redacted]w[redacted]d 1. Rh negative state in antepartum period - Antibody screen - rho (d) immune globulin (RHIG/RHOPHYLAC) injection 300 mcg  2.  Supervision of other normal pregnancy, antepartum 28 wk labs Offered PT--will research on line for treatment/stretches for sciatica--limit tylenol - Glucose Tolerance, 2 Hours w/1 Hour - CBC - RPR - HIV Antibody (routine testing w rflx) - Antibody screen  3. Benign gestational thrombocytopenia in third trimester ([place]) Last plts 140--recheck today  4. Previous cesarean delivery affecting pregnancy, antepartum R/B/A to TOLAC vs RCS--desires TOLAC--consent signed  5. History of gestational hypertension On ASA  6. Need for Tdap vaccination - Tdap vaccine greater than or equal to 7yo IM  7. Generalized anxiety disorder On Zoloft and feels well  Preterm labor symptoms and general obstetric precautions including but not limited to vaginal bleeding, contractions, leaking of fluid and fetal movement were reviewed in detail with the patient. Please refer to After Visit Summary for other counseling recommendations.   Return in 2 weeks (on 12/14/2021).  Future Appointments  Date Time Provider [place]  12/01/2021  8:30 AM [facility] NURSE [facility]  12/01/2021  8:45 AM [facility] US4 [facility]  12/14/2021  9:55 AM Aletha Halim, MD [facility]  12/28/2021 10:55 AM Anyanwu, Sallyanne Havers, MD [facility]  01/11/2022 10:35 AM Donnamae Jude, MD [facility]  01/25/2022 10:55 AM Donnamae Jude, MD [facility]    Donnamae Jude, MD

## 2021-11-30 NOTE — Progress Notes (Signed)
ROB [redacted]w[redacted]d  CC: pain in left buttocks area, uncomfortable with daily walking and movement. Pt has tried massage roller no relief.   T-Dap: offered pt desires.

## 2021-11-30 NOTE — Patient Instructions (Signed)

## 2021-12-01 ENCOUNTER — Other Ambulatory Visit: Payer: Self-pay | Admitting: *Deleted

## 2021-12-01 ENCOUNTER — Ambulatory Visit: Payer: 59 | Attending: Obstetrics

## 2021-12-01 ENCOUNTER — Ambulatory Visit: Payer: 59 | Admitting: *Deleted

## 2021-12-01 VITALS — BP 110/62 | HR 64

## 2021-12-01 DIAGNOSIS — O0992 Supervision of high risk pregnancy, unspecified, second trimester: Secondary | ICD-10-CM | POA: Diagnosis present

## 2021-12-01 DIAGNOSIS — O99843 Bariatric surgery status complicating pregnancy, third trimester: Secondary | ICD-10-CM

## 2021-12-01 DIAGNOSIS — Z6838 Body mass index (BMI) 38.0-38.9, adult: Secondary | ICD-10-CM | POA: Diagnosis present

## 2021-12-01 DIAGNOSIS — E669 Obesity, unspecified: Secondary | ICD-10-CM

## 2021-12-01 DIAGNOSIS — O99840 Bariatric surgery status complicating pregnancy, unspecified trimester: Secondary | ICD-10-CM

## 2021-12-01 DIAGNOSIS — Z3A28 28 weeks gestation of pregnancy: Secondary | ICD-10-CM

## 2021-12-01 DIAGNOSIS — O99213 Obesity complicating pregnancy, third trimester: Secondary | ICD-10-CM

## 2021-12-01 DIAGNOSIS — O09293 Supervision of pregnancy with other poor reproductive or obstetric history, third trimester: Secondary | ICD-10-CM | POA: Diagnosis not present

## 2021-12-01 DIAGNOSIS — O34219 Maternal care for unspecified type scar from previous cesarean delivery: Secondary | ICD-10-CM

## 2021-12-01 DIAGNOSIS — Z9884 Bariatric surgery status: Secondary | ICD-10-CM | POA: Insufficient documentation

## 2021-12-01 DIAGNOSIS — O360130 Maternal care for anti-D [Rh] antibodies, third trimester, not applicable or unspecified: Secondary | ICD-10-CM

## 2021-12-01 LAB — CBC
Hematocrit: 38.5 % (ref 34.0–46.6)
Hemoglobin: 12.9 g/dL (ref 11.1–15.9)
MCH: 29.9 pg (ref 26.6–33.0)
MCHC: 33.5 g/dL (ref 31.5–35.7)
MCV: 89 fL (ref 79–97)
Platelets: 132 10*3/uL — ABNORMAL LOW (ref 150–450)
RBC: 4.32 x10E6/uL (ref 3.77–5.28)
RDW: 12.8 % (ref 11.7–15.4)
WBC: 9.7 10*3/uL (ref 3.4–10.8)

## 2021-12-01 LAB — GLUCOSE TOLERANCE, 2 HOURS W/ 1HR
Glucose, 1 hour: 100 mg/dL (ref 70–179)
Glucose, 2 hour: 54 mg/dL — ABNORMAL LOW (ref 70–152)
Glucose, Fasting: 65 mg/dL — ABNORMAL LOW (ref 70–91)

## 2021-12-01 LAB — RPR: RPR Ser Ql: NONREACTIVE

## 2021-12-01 LAB — HIV ANTIBODY (ROUTINE TESTING W REFLEX): HIV Screen 4th Generation wRfx: NONREACTIVE

## 2021-12-01 LAB — ANTIBODY SCREEN: Antibody Screen: NEGATIVE

## 2021-12-14 ENCOUNTER — Telehealth (INDEPENDENT_AMBULATORY_CARE_PROVIDER_SITE_OTHER): Payer: 59 | Admitting: Obstetrics and Gynecology

## 2021-12-14 VITALS — BP 110/72 | HR 73 | Wt 226.0 lb

## 2021-12-14 DIAGNOSIS — Z9884 Bariatric surgery status: Secondary | ICD-10-CM

## 2021-12-14 DIAGNOSIS — O26893 Other specified pregnancy related conditions, third trimester: Secondary | ICD-10-CM

## 2021-12-14 DIAGNOSIS — D696 Thrombocytopenia, unspecified: Secondary | ICD-10-CM

## 2021-12-14 DIAGNOSIS — F411 Generalized anxiety disorder: Secondary | ICD-10-CM

## 2021-12-14 DIAGNOSIS — Z8759 Personal history of other complications of pregnancy, childbirth and the puerperium: Secondary | ICD-10-CM

## 2021-12-14 DIAGNOSIS — Z6838 Body mass index (BMI) 38.0-38.9, adult: Secondary | ICD-10-CM | POA: Insufficient documentation

## 2021-12-14 DIAGNOSIS — Z6791 Unspecified blood type, Rh negative: Secondary | ICD-10-CM

## 2021-12-14 DIAGNOSIS — O34219 Maternal care for unspecified type scar from previous cesarean delivery: Secondary | ICD-10-CM

## 2021-12-14 DIAGNOSIS — Z3A30 30 weeks gestation of pregnancy: Secondary | ICD-10-CM

## 2021-12-14 DIAGNOSIS — O99113 Other diseases of the blood and blood-forming organs and certain disorders involving the immune mechanism complicating pregnancy, third trimester: Secondary | ICD-10-CM

## 2021-12-14 DIAGNOSIS — Z3480 Encounter for supervision of other normal pregnancy, unspecified trimester: Secondary | ICD-10-CM

## 2021-12-14 NOTE — Progress Notes (Signed)
TELEHEALTH OBSTETRICS VISIT ENCOUNTER NOTE  Provider location: Center for [facility]   Patient location: Home  I connected with Danielle Sanchez on 12/14/21 at  9:55 AM EDT by telephone at home and verified that I am speaking with the correct person using two identifiers. Of note, unable to do video encounter due to technical difficulties.    I discussed the limitations, risks, security and privacy concerns of performing an evaluation and management service by telephone and the availability of in person appointments. I also discussed with the patient that there may be a patient responsible charge related to this service. The patient expressed understanding and agreed to proceed.  Subjective:  Danielle Sanchez is a 36 y.o. G3P1011 at [redacted]w[redacted]d being followed for ongoing prenatal care.  She is currently monitored for the following issues for this high-risk pregnancy and has Maternal obesity, antepartum; Elevated prolactin level; History of gestational hypertension; Prediabetes; Supervision of other normal pregnancy, antepartum; Migraine without status migrainosus, not intractable; S/P biliopancreatic diversion with duodenal switch; Previous cesarean delivery affecting pregnancy, antepartum; Gestational thrombocytopenia ([facility]); Rh negative state in antepartum period; Generalized anxiety disorder; and BMI 38.0-38.9,adult on their problem list.  Patient reports no complaints. Reports fetal movement. Denies any contractions, bleeding or leaking of fluid.   The following portions of the patient's history were reviewed and updated as appropriate: allergies, current medications, past family history, past medical history, past social history, past surgical history and problem list.   Objective:  Blood pressure 110/72, pulse 73, weight 226 lb (102.5 kg), last menstrual period 05/17/2021. General:  Alert, oriented and cooperative.   Mental Status: Normal mood and affect perceived.  Normal judgment and thought content.  Rest of physical exam deferred due to type of encounter  Assessment and Plan:  Pregnancy: G3P1011 at [redacted]w[redacted]d 1. Rh negative state in antepartum period Rhogam on 5/31. Repeat pp prn  2. 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 Recommend qmonth checks for now>>d/w pt to check late June/early July    Latest Ref Rng & Units 11/30/2021    9:18 AM 08/16/2021   11:21 AM 08/03/2021   10:22 AM  CBC  WBC 3.4 - 10.8 x10E3/uL 9.7  8.1  7.6   Hemoglobin 11.1 - 15.9 g/dL 31.5  17.6  16.0   Hematocrit 34.0 - 46.6 % 38.5  41.6  40.9   Platelets 150 - 450 x10E3/uL 132  138  138   - CBC; Future  3. Previous cesarean delivery affecting pregnancy, antepartum 2019 for failed two day IOL for gHTN, NRFHT via LTCS. Tolac consent alread signed  4. Supervision of other normal pregnancy, antepartum Normal 28wk labs  5. History of gestational hypertension  6. Generalized anxiety disorder  7. BMI 38.0-38.9,adult  8. S/P biliopancreatic diversion with duodenal switch Continue with monthly growth scans 6/1: 73%, ac 79%, normal afi  Preterm labor symptoms and general obstetric precautions including but not limited to vaginal bleeding, contractions, leaking of fluid and fetal movement were reviewed in detail with the patient.  I discussed the assessment and treatment plan with the patient. The patient was provided an opportunity to ask questions and all were answered. The patient agreed with the plan and demonstrated an understanding of the instructions. The patient was advised to call back or seek an in-person office evaluation/go to MAU at [facility] for any urgent or concerning symptoms. Please refer to After  Visit Summary for other counseling recommendations.   I provided 7 minutes of non-face-to-face time during this encounter.  No follow-ups on file.  Future  Appointments  Date Time Provider [place]  12/28/2021 10:55 AM Harolyn Rutherford, Sallyanne Havers, MD [facility]  12/29/2021 11:15 AM [facility] NURSE [facility]  12/29/2021 11:30 AM [facility] US3 [facility]  01/11/2022 10:35 AM Donnamae Jude, MD [facility]  01/25/2022 10:55 AM Donnamae Jude, MD [facility]  01/26/2022 12:30 PM [facility] NURSE [facility]  01/26/2022 12:45 PM [facility] US4 [facility] [place], MD Center for [place], [place]

## 2021-12-14 NOTE — Progress Notes (Signed)
Virtual ROB    Pt able to check B/P  ans weight at home.   CC: None

## 2021-12-27 ENCOUNTER — Other Ambulatory Visit: Payer: 59

## 2021-12-27 DIAGNOSIS — D696 Thrombocytopenia, unspecified: Secondary | ICD-10-CM

## 2021-12-28 ENCOUNTER — Telehealth (INDEPENDENT_AMBULATORY_CARE_PROVIDER_SITE_OTHER): Payer: 59 | Admitting: Obstetrics & Gynecology

## 2021-12-28 VITALS — BP 108/63 | HR 70 | Wt 221.4 lb

## 2021-12-28 DIAGNOSIS — D696 Thrombocytopenia, unspecified: Secondary | ICD-10-CM

## 2021-12-28 DIAGNOSIS — Z8759 Personal history of other complications of pregnancy, childbirth and the puerperium: Secondary | ICD-10-CM

## 2021-12-28 DIAGNOSIS — O99113 Other diseases of the blood and blood-forming organs and certain disorders involving the immune mechanism complicating pregnancy, third trimester: Secondary | ICD-10-CM

## 2021-12-28 DIAGNOSIS — Z3A32 32 weeks gestation of pregnancy: Secondary | ICD-10-CM

## 2021-12-28 DIAGNOSIS — Z3480 Encounter for supervision of other normal pregnancy, unspecified trimester: Secondary | ICD-10-CM

## 2021-12-28 DIAGNOSIS — O09293 Supervision of pregnancy with other poor reproductive or obstetric history, third trimester: Secondary | ICD-10-CM

## 2021-12-28 DIAGNOSIS — O34219 Maternal care for unspecified type scar from previous cesarean delivery: Secondary | ICD-10-CM

## 2021-12-28 LAB — CBC
Hematocrit: 37.9 % (ref 34.0–46.6)
Hemoglobin: 12.5 g/dL (ref 11.1–15.9)
MCH: 29.6 pg (ref 26.6–33.0)
MCHC: 33 g/dL (ref 31.5–35.7)
MCV: 90 fL (ref 79–97)
Platelets: 133 10*3/uL — ABNORMAL LOW (ref 150–450)
RBC: 4.22 x10E6/uL (ref 3.77–5.28)
RDW: 12.4 % (ref 11.7–15.4)
WBC: 9.1 10*3/uL (ref 3.4–10.8)

## 2021-12-28 NOTE — Patient Instructions (Signed)
Return to office for any scheduled appointments. Call the office or go to the [facility] at [facility] [place] if: You begin to have strong, frequent contractions Your water breaks.  Sometimes it is a big gush of fluid, sometimes it is just a trickle that keeps getting your underwear wet or running down your legs You have vaginal bleeding.  It is normal to have a small amount of spotting if your cervix was checked.  You do not feel your baby moving like normal.  If you do not, get something to eat and drink and lay down and focus on feeling your baby move.   If your baby is still not moving like normal, you should call the office or go to MAU. Any other obstetric concerns.  

## 2021-12-28 NOTE — Progress Notes (Signed)
OBSTETRICS PRENATAL VIRTUAL VISIT ENCOUNTER NOTE  Provider location: [facility] for [place] [facility]   Patient location: Home  I connected with Fayette Pho on 12/28/21 at 10:55 AM EDT by MyChart Video Encounter and verified that I am speaking with the correct person using two identifiers. I discussed the limitations, risks, security and privacy concerns of performing an evaluation and management service virtually and the availability of in person appointments. I also discussed with the patient that there may be a patient responsible charge related to this service. The patient expressed understanding and agreed to proceed. Subjective:  Danielle Sanchez is a 36 y.o. G3P1011 at [redacted]w[redacted]d being seen today for ongoing prenatal care.  She is currently monitored for the following issues for this low-risk pregnancy and has Maternal obesity, antepartum; Elevated prolactin level; History of gestational hypertension; Prediabetes; Supervision of other normal pregnancy, antepartum; Migraine without status migrainosus, not intractable; S/P biliopancreatic diversion with duodenal switch; Previous cesarean delivery affecting pregnancy, antepartum; Gestational thrombocytopenia ([place]); Rh negative state in antepartum period; Generalized anxiety disorder; and BMI 38.0-38.9,adult on their problem list.  Patient reports no complaints.  Contractions: Irritability. Vag. Bleeding: None.  Movement: Present. Denies any leaking of fluid.   The following portions of the patient's history were reviewed and updated as appropriate: allergies, current medications, past family history, past medical history, past social history, past surgical history and problem list.   Objective:   Vitals:   12/28/21 1053  BP: 108/63  Pulse: 70  Weight: 221 lb 6.4 oz (100.4 kg)    Fetal Status:     Movement: Present     General:  Alert, oriented and cooperative. Patient is in no acute distress.  Respiratory: Normal  respiratory effort, no problems with respiration noted  Mental Status: Normal mood and affect. Normal behavior. Normal judgment and thought content.  Rest of physical exam deferred due to type of encounter  Imaging: [place] MFM OB FOLLOW UP  Result Date: 12/01/2021 ----------------------------------------------------------------------  OBSTETRICS REPORT                       (Signed Final 12/01/2021 09:46 am) ---------------------------------------------------------------------- Patient Info  ID #:       [id number]                          D.O.B.:  [birth year] (35 yrs)  Name:       Fayette Pho                 Visit Date: 12/01/2021 08:53 am ---------------------------------------------------------------------- Performed By  Attending:        Tama High MD        Ref. Address:     [place]  Performed By:     Rodrigo Ran BS      [facility]  Referred By:      [facility] ---------------------------------------------------------------------- Orders  #  Description                           Code        Ordered By  1  [place] MFM OB FOLLOW UP                   [id number]    Rosana Hoes ----------------------------------------------------------------------  #  Order #                     Accession #                Episode #  1  [id number]                   [id number]                 [id number] ---------------------------------------------------------------------- Indications  [redacted] weeks gestation of pregnancy                Z3A.28  Pregnancy complicated by previous gastric      O99.843  bypass, antepartum, third trimester  Obesity complicating pregnancy, third          O99.213  trimester (pregravid BMI 38)  Poor  obstetric history: Previous               O09.299  preeclampsia / eclampsia/gestational HTN  Medical complication of pregnancy              O26.90  (biliopancreatic diversion)  Encounter for other antenatal screening        Z36.2  follow-up  Poor obstetrical history ([place])    O09.299  Rh negative state in antepartum                O36.0190  History of cesarean delivery, currently        O34.219  pregnant  Declined genetic screening ---------------------------------------------------------------------- Fetal Evaluation  Num Of Fetuses:         1  Fetal Heart Rate(bpm):  138  Cardiac Activity:       Observed  Presentation:           Cephalic  Placenta:               Anterior  P. Cord Insertion:      Visualized  Amniotic Fluid  AFI FV:      Within normal limits  AFI Sum(cm)     %Tile       Largest Pocket(cm)  17              63          6.2  RUQ(cm)       RLQ(cm)       LUQ(cm)        LLQ(cm)  6.2           1.4           5.6            3.8 ---------------------------------------------------------------------- Biometry  BPD:      76.7  mm     G. Age:  30w 5d         96  %    CI:        74.43   %    70 - 86                                                          FL/HC:      18.5   %    18.8 - 20.6  HC:      282.2  mm     G. Age:  31w 0d         91  %    HC/AC:      1.11        1.05 - 1.21  AC:      253.3  mm     G. Age:  29w 4d         79  %    FL/BPD:     68.2   %    71 - 87  FL:       52.3  mm     G. Age:  27w 6d         23  %    FL/AC:      20.6   %    20 - 24  LV:        3.1  mm  Est. FW:    1353  gm           3 lb     73  % ---------------------------------------------------------------------- OB History  Blood Type:   O-  Gravidity:    3         Term:   1        Prem:   0        SAB:   1  TOP:          0       Ectopic:  0        Living: 1 ---------------------------------------------------------------------- Gestational Age  LMP:           28w 2d        Date:  05/17/21                 EDD:   02/21/22   U/S Today:     29w 6d                                        EDD:   02/10/22  Best:          28w 2d     Det. By:  LMP  (05/17/21)          EDD:   02/21/22 ---------------------------------------------------------------------- Anatomy  Cranium:  Appears normal         LVOT:                   Appears normal  Cavum:                 Appears normal         Aortic Arch:            Appears normal  Ventricles:            Appears normal         Ductal Arch:            Previously seen  Choroid Plexus:        Previously seen        Diaphragm:              Appears normal  Cerebellum:            Previously seen        Stomach:                Appears normal, left                                                                        sided  Posterior Fossa:       Previously seen        Abdomen:                Appears normal  Nuchal Fold:           Previously seen        Abdominal Wall:         Previously seen  Face:                  Appears normal         Cord Vessels:           Previously seen                         (orbits and profile)  Lips:                  Appears normal         Kidneys:                Appear normal  Palate:                Previously seen        Bladder:                Appears normal  Thoracic:              Appears normal         Spine:                  Previously seen  Heart:                 Appears normal         Upper Extremities:      Previously seen                         (4CH, axis, and  situs)  RVOT:                  Appears normal         Lower Extremities:      Previously seen  Other:  Female gender previously seen. Nasal bone, lenses, maxilla, mandible,          falx, Heels/feet, open hands/5th digits previously visualized. VC, 3VV,          and 3VTV visualized. ---------------------------------------------------------------------- Cervix Uterus Adnexa  Cervix  Not visualized (advanced GA >24wks)  Uterus  No abnormality visualized.  Right Ovary  Not visualized.   Left Ovary  Not visualized.  Cul De Sac  No free fluid seen.  Adnexa  No abnormality visualized. ---------------------------------------------------------------------- Impression  History of bariatric surgery.  Patient does not have gestational  diabetes.  Fetal growth is appropriate for gestational age .Amniotic fluid  is normal and good fetal activity is seen . ---------------------------------------------------------------------- Recommendations  -An appointment was made for her to return in 4 weeks for  fetal growth assessment. ----------------------------------------------------------------------                 Tama High, MD Electronically Signed Final Report   12/01/2021 09:46 am ----------------------------------------------------------------------   Assessment and Plan:  Pregnancy: G3P1011 at [redacted]w[redacted]d 1. History of gestational hypertension Stable BP today, continue to monitor closely  2. Benign gestational thrombocytopenia in third trimester ([place]) CBC on 12/27/21 133K, stable. Recheck at 36 weeks.    Latest Ref Rng & Units 12/27/2021    8:07 AM 11/30/2021    9:18 AM 08/16/2021   11:21 AM  CBC  WBC 3.4 - 10.8 x10E3/uL 9.1  9.7  8.1   Hemoglobin 11.1 - 15.9 g/dL 12.5  12.9  13.8   Hematocrit 34.0 - 46.6 % 37.9  38.5  41.6   Platelets 150 - 450 x10E3/uL 133  132  138    3. Previous cesarean delivery affecting pregnancy, antepartum Desires TOLAC, consent signed 11/30/21.  4. [redacted] weeks gestation of pregnancy 5. Supervision of other normal pregnancy, antepartum No other concerns.  Preterm labor symptoms and general obstetric precautions including but not limited to vaginal bleeding, contractions, leaking of fluid and fetal movement were reviewed in detail with the patient. I discussed the assessment and treatment plan with the patient. The patient was provided an opportunity to ask questions and all were answered. The patient agreed with the plan and demonstrated an understanding of the  instructions. The patient was advised to call back or seek an in-person office evaluation/go to MAU at [facility] for any urgent or concerning symptoms. Please refer to After Visit Summary for other counseling recommendations.   I provided 10 minutes of face-to-face time during this encounter.  Return in about 2 weeks (around 01/11/2022) for OFFICE OB VISIT (MD or APP).  Future Appointments  Date Time Provider [place]  12/29/2021 11:15 AM [facility] NURSE [facility]  12/29/2021 11:30 AM [facility] US3 [facility]  01/11/2022 10:35 AM Donnamae Jude, MD [facility]  01/25/2022 10:55 AM Donnamae Jude, MD [facility]  01/26/2022 12:30 PM [facility] NURSE [facility]  01/26/2022 12:45 PM [facility] US4 [facility]  02/01/2022  3:30 PM Donnamae Jude, MD [facility]  02/08/2022 10:55 AM Donnamae Jude, MD [facility]  02/15/2022 10:55 AM Donnamae Jude, MD [facility]  02/22/2022 10:55 AM Donnamae Jude, MD [facility]    Verita Schneiders, MD Center for [place] [facility], [facility]

## 2021-12-29 ENCOUNTER — Encounter: Payer: Self-pay | Admitting: *Deleted

## 2021-12-29 ENCOUNTER — Ambulatory Visit: Payer: 59 | Attending: Obstetrics and Gynecology

## 2021-12-29 ENCOUNTER — Ambulatory Visit: Payer: 59 | Admitting: *Deleted

## 2021-12-29 VITALS — BP 116/67 | HR 65

## 2021-12-29 DIAGNOSIS — O99843 Bariatric surgery status complicating pregnancy, third trimester: Secondary | ICD-10-CM | POA: Diagnosis not present

## 2021-12-29 DIAGNOSIS — O99213 Obesity complicating pregnancy, third trimester: Secondary | ICD-10-CM | POA: Diagnosis not present

## 2021-12-29 DIAGNOSIS — D696 Thrombocytopenia, unspecified: Secondary | ICD-10-CM | POA: Insufficient documentation

## 2021-12-29 DIAGNOSIS — O10013 Pre-existing essential hypertension complicating pregnancy, third trimester: Secondary | ICD-10-CM | POA: Diagnosis not present

## 2021-12-29 DIAGNOSIS — O09293 Supervision of pregnancy with other poor reproductive or obstetric history, third trimester: Secondary | ICD-10-CM | POA: Diagnosis not present

## 2021-12-29 DIAGNOSIS — Z3480 Encounter for supervision of other normal pregnancy, unspecified trimester: Secondary | ICD-10-CM | POA: Insufficient documentation

## 2021-12-29 DIAGNOSIS — O360130 Maternal care for anti-D [Rh] antibodies, third trimester, not applicable or unspecified: Secondary | ICD-10-CM | POA: Diagnosis not present

## 2021-12-29 DIAGNOSIS — E669 Obesity, unspecified: Secondary | ICD-10-CM

## 2021-12-29 DIAGNOSIS — Z6791 Unspecified blood type, Rh negative: Secondary | ICD-10-CM

## 2021-12-29 DIAGNOSIS — Z3A32 32 weeks gestation of pregnancy: Secondary | ICD-10-CM | POA: Insufficient documentation

## 2021-12-29 DIAGNOSIS — O26893 Other specified pregnancy related conditions, third trimester: Secondary | ICD-10-CM | POA: Insufficient documentation

## 2021-12-29 DIAGNOSIS — F411 Generalized anxiety disorder: Secondary | ICD-10-CM

## 2021-12-29 DIAGNOSIS — O34219 Maternal care for unspecified type scar from previous cesarean delivery: Secondary | ICD-10-CM

## 2021-12-29 DIAGNOSIS — Z8759 Personal history of other complications of pregnancy, childbirth and the puerperium: Secondary | ICD-10-CM

## 2021-12-29 DIAGNOSIS — O26899 Other specified pregnancy related conditions, unspecified trimester: Secondary | ICD-10-CM | POA: Insufficient documentation

## 2021-12-29 DIAGNOSIS — O99113 Other diseases of the blood and blood-forming organs and certain disorders involving the immune mechanism complicating pregnancy, third trimester: Secondary | ICD-10-CM | POA: Insufficient documentation

## 2021-12-29 DIAGNOSIS — O99840 Bariatric surgery status complicating pregnancy, unspecified trimester: Secondary | ICD-10-CM | POA: Insufficient documentation

## 2022-01-11 ENCOUNTER — Telehealth (INDEPENDENT_AMBULATORY_CARE_PROVIDER_SITE_OTHER): Payer: 59 | Admitting: Family Medicine

## 2022-01-11 ENCOUNTER — Encounter: Payer: Self-pay | Admitting: Family Medicine

## 2022-01-11 VITALS — BP 127/60 | HR 70 | Wt 223.2 lb

## 2022-01-11 DIAGNOSIS — D696 Thrombocytopenia, unspecified: Secondary | ICD-10-CM

## 2022-01-11 DIAGNOSIS — Z3480 Encounter for supervision of other normal pregnancy, unspecified trimester: Secondary | ICD-10-CM

## 2022-01-11 DIAGNOSIS — Z8759 Personal history of other complications of pregnancy, childbirth and the puerperium: Secondary | ICD-10-CM

## 2022-01-11 DIAGNOSIS — O26899 Other specified pregnancy related conditions, unspecified trimester: Secondary | ICD-10-CM

## 2022-01-11 DIAGNOSIS — O99113 Other diseases of the blood and blood-forming organs and certain disorders involving the immune mechanism complicating pregnancy, third trimester: Secondary | ICD-10-CM

## 2022-01-11 DIAGNOSIS — F411 Generalized anxiety disorder: Secondary | ICD-10-CM

## 2022-01-11 DIAGNOSIS — Z6791 Unspecified blood type, Rh negative: Secondary | ICD-10-CM

## 2022-01-11 DIAGNOSIS — O34219 Maternal care for unspecified type scar from previous cesarean delivery: Secondary | ICD-10-CM

## 2022-01-11 NOTE — Progress Notes (Signed)
TC to patient for virtual visit. Reports +fetal movement. Denies contractions, LOF, vaginal bleeding. Has no concerns today.

## 2022-01-11 NOTE — Progress Notes (Signed)
OBSTETRICS PRENATAL VIRTUAL VISIT ENCOUNTER NOTE  Provider location: [facility] [place] [facility]   Patient location: Home  I connected with Danielle Sanchez on 01/11/22 at 10:35 AM EDT by MyChart Video Encounter and verified that I am speaking with the correct person using two identifiers. I discussed the limitations, risks, security and privacy concerns of performing an evaluation and management service virtually and the availability of in person appointments. I also discussed with the patient that there may be a patient responsible charge related to this service. The patient expressed understanding and agreed to proceed. Subjective:  Danielle Sanchez is a 36 y.o. G3P1011 at [redacted]w[redacted]d being seen today for ongoing prenatal care.  She is currently monitored for the following issues for this high-risk pregnancy and has Maternal obesity, antepartum; Elevated prolactin level; History of gestational hypertension; Prediabetes; Supervision of other normal pregnancy, antepartum; Migraine without status migrainosus, not intractable; S/P biliopancreatic diversion with duodenal switch; Previous cesarean delivery affecting pregnancy, antepartum; Gestational thrombocytopenia ([place]); Rh negative state in antepartum period; Generalized anxiety disorder; and BMI 38.0-38.9,adult on their problem list.  Patient reports no complaints.  Contractions: Irritability. Vag. Bleeding: None.  Movement: Present. Denies any leaking of fluid.   The following portions of the patient's history were reviewed and updated as appropriate: allergies, current medications, past family history, past medical history, past social history, past surgical history and problem list.   Objective:   Vitals:   01/11/22 1028  BP: 127/60  Pulse: 70  Weight: 223 lb 3.2 oz (101.2 kg)    Fetal Status:     Movement: Present     General:  Alert, oriented and cooperative. Patient is in no acute distress.  Respiratory: Normal  respiratory effort, no problems with respiration noted  Mental Status: Normal mood and affect. Normal behavior. Normal judgment and thought content.  Rest of physical exam deferred due to type of encounter  Imaging: [place] MFM OB FOLLOW UP  Result Date: 12/29/2021 ----------------------------------------------------------------------  OBSTETRICS REPORT                       (Signed Final 12/29/2021 12:23 pm) ---------------------------------------------------------------------- Patient Info  ID #:       [id number]                          D.O.B.:  02/24/1986 (35 yrs)  Name:       Danielle Sanchez                 Visit Date: 12/29/2021 11:50 am ---------------------------------------------------------------------- Performed By  Attending:        Sander Nephew      Ref. Address:     [place]  Performed By:     Margaretann Loveless     [facility]  Referred By:      [facility] ---------------------------------------------------------------------- Orders  #  Description                           Code        Ordered By  1  [place] MFM OB FOLLOW UP                   [phone]    Noralee Space ----------------------------------------------------------------------  #  Order #                     Accession #                Episode #  1  [id number]                   [id number]                 [id number] ---------------------------------------------------------------------- Indications  Pregnancy complicated by previous gastric      O99.843  bypass, antepartum, third trimester  Obesity complicating pregnancy, third          O99.213  trimester (pregravid BMI 38)  Poor obstetric history: Previous                O09.299  preeclampsia / eclampsia/gestational HTN  Medical complication of pregnancy              O26.90  (biliopancreatic diversion)  Poor obstetrical history (Thrombocyotpenia)    O09.299  Rh negative state in antepartum                O36.0190  History of cesarean delivery, currently        O34.219  pregnant  Declined genetic screening  [redacted] weeks gestation of pregnancy                Z3A.32 ---------------------------------------------------------------------- Fetal Evaluation  Num Of Fetuses:         1  Fetal Heart Rate(bpm):  146  Cardiac Activity:       Observed  Presentation:           Cephalic  Placenta:               Anterior  P. Cord Insertion:      Previously Visualized  Amniotic Fluid  AFI FV:      Within normal limits  AFI Sum(cm)     %Tile       Largest Pocket(cm)  14.72           52          5.9  RUQ(cm)       RLQ(cm)       LUQ(cm)  LLQ(cm)  5.9           1.55          4.09           3.18 ---------------------------------------------------------------------- Biometry  BPD:     85.98  mm     G. Age:  34w 5d         95  %    CI:        73.88   %    70 - 86                                                          FL/HC:      18.9   %    19.1 - 21.3  HC:      317.7  mm     G. Age:  35w 5d         93  %    HC/AC:      1.03        0.96 - 1.17  AC:    307.74   mm     G. Age:  34w 5d         97  %    FL/BPD:     69.8   %    71 - 87  FL:      60.01  mm     G. Age:  31w 2d         14  %    FL/AC:      19.5   %    20 - 24  HUM:      52.6  mm     G. Age:  30w 5d         20  %  Est. FW:    2293  gm      5 lb 1 oz     86  % ---------------------------------------------------------------------- OB History  Blood Type:   O-  Gravidity:    3         Term:   1        Prem:   0        SAB:   1  TOP:          0       Ectopic:  0        Living: 1 ---------------------------------------------------------------------- Gestational Age  LMP:           32w 2d        Date:  05/17/21                 EDD:   02/21/22   U/S Today:     34w 1d                                        EDD:   02/08/22  Best:          32w 2d     Det. By:  LMP  (05/17/21)          EDD:   02/21/22 ---------------------------------------------------------------------- Anatomy  Cranium:               Appears normal  LVOT:                   Appears normal  Cavum:                 Appears normal         Aortic Arch:            Previously seen  Ventricles:            Appears normal         Ductal Arch:            Previously seen  Choroid Plexus:        Previously seen        Diaphragm:              Appears normal  Cerebellum:            Previously seen        Stomach:                Appears normal, left                                                                        sided  Posterior Fossa:       Previously seen        Abdomen:                Appears normal  Nuchal Fold:           Previously seen        Abdominal Wall:         Previously seen  Face:                  Orbits and profile     Cord Vessels:           Previously seen                         previously seen  Lips:                  Previously seen        Kidneys:                Appear normal  Palate:                Previously seen        Bladder:                Appears normal  Thoracic:              Appears normal         Spine:                  Previously seen  Heart:                 Appears normal         Upper Extremities:      Previously seen                         (4CH, axis, and  situs)  RVOT:                  Appears normal         Lower Extremities:      Previously seen  Other:  Female gender previously seen. Nasal bone, lenses, maxilla, mandible,          falx, Heels/feet, open hands/5th digits previously visualized. VC, 3VV,          and 3VTV previously visualized. ---------------------------------------------------------------------- Cervix Uterus Adnexa  Cervix  Not visualized (advanced GA >24wks)  Uterus  Normal shape and size.  Right Ovary  Not visualized.   Left Ovary  Not visualized. ---------------------------------------------------------------------- Impression  Follow up growth due to elevated BMI and chronic  hypertension not on medication.  Normal interval growth with measurements consistent with  dates  Good fetal movement and amniotic fluid volume ---------------------------------------------------------------------- Recommendations  Follow up growth as previously scheduled. ----------------------------------------------------------------------              Sander Nephew, MD Electronically Signed Final Report   12/29/2021 12:23 pm ----------------------------------------------------------------------   Assessment and Plan:  Pregnancy: [id number] at [redacted]w[redacted]d 1. Rh negative state in antepartum period S/p rhogam  2. Benign gestational thrombocytopenia in third trimester (HCC) Plts stable at 133  3. Previous cesarean delivery affecting pregnancy, antepartum For TOLAC  4. Supervision of other normal pregnancy, antepartum Will need cultures next visit  5. History of gestational hypertension BP is normal today  6. Generalized anxiety disorder On Zoloft  Preterm labor symptoms and general obstetric precautions including but not limited to vaginal bleeding, contractions, leaking of fluid and fetal movement were reviewed in detail with the patient. I discussed the assessment and treatment plan with the patient. The patient was provided an opportunity to ask questions and all were answered. The patient agreed with the plan and demonstrated an understanding of the instructions. The patient was advised to call back or seek an in-person office evaluation/go to MAU at [facility] for any urgent or concerning symptoms. Please refer to After Visit Summary for other counseling recommendations.   I provided 8 minutes of face-to-face time during this encounter.  Return in 2 weeks (on 01/25/2022).  Future Appointments  Date Time Provider  Department Center  01/25/2022 10:55 AM Donnamae Jude, MD [facility]  01/26/2022 12:30 PM [facility] NURSE [facility]  01/26/2022 12:45 PM [facility] US4 [facility]  02/01/2022  3:30 PM Donnamae Jude, MD [facility]  02/08/2022 10:55 AM Donnamae Jude, MD [facility]  02/15/2022 10:55 AM Donnamae Jude, MD [facility]  02/22/2022 10:55 AM Donnamae Jude, MD [facility]    Donnamae Jude, [place] for [facility] [place]

## 2022-01-25 ENCOUNTER — Other Ambulatory Visit (HOSPITAL_COMMUNITY)
Admission: RE | Admit: 2022-01-25 | Discharge: 2022-01-25 | Disposition: A | Discharge status: Home / Self Care | Payer: 59 | Source: Ambulatory Visit | Attending: Family Medicine | Admitting: Family Medicine

## 2022-01-25 ENCOUNTER — Ambulatory Visit (INDEPENDENT_AMBULATORY_CARE_PROVIDER_SITE_OTHER): Payer: 59 | Admitting: Family Medicine

## 2022-01-25 VITALS — BP 109/77 | HR 69 | Wt 226.0 lb

## 2022-01-25 DIAGNOSIS — O368131 Decreased fetal movements, third trimester, fetus 1: Secondary | ICD-10-CM | POA: Diagnosis not present

## 2022-01-25 DIAGNOSIS — D696 Thrombocytopenia, unspecified: Secondary | ICD-10-CM

## 2022-01-25 DIAGNOSIS — Z3480 Encounter for supervision of other normal pregnancy, unspecified trimester: Secondary | ICD-10-CM | POA: Insufficient documentation

## 2022-01-25 DIAGNOSIS — Z3A36 36 weeks gestation of pregnancy: Secondary | ICD-10-CM | POA: Diagnosis not present

## 2022-01-25 DIAGNOSIS — O34219 Maternal care for unspecified type scar from previous cesarean delivery: Secondary | ICD-10-CM

## 2022-01-25 DIAGNOSIS — O99113 Other diseases of the blood and blood-forming organs and certain disorders involving the immune mechanism complicating pregnancy, third trimester: Secondary | ICD-10-CM

## 2022-01-25 DIAGNOSIS — O26893 Other specified pregnancy related conditions, third trimester: Secondary | ICD-10-CM

## 2022-01-25 DIAGNOSIS — Z6791 Unspecified blood type, Rh negative: Secondary | ICD-10-CM

## 2022-01-25 NOTE — Progress Notes (Signed)
   PRENATAL VISIT NOTE  Subjective:  Danielle Sanchez is a 36 y.o. G3P1011 at [redacted]w[redacted]d being seen today for ongoing prenatal care.  She is currently monitored for the following issues for this low-risk pregnancy and has Maternal obesity, antepartum; Elevated prolactin level; History of gestational hypertension; Prediabetes; Supervision of other normal pregnancy, antepartum; Migraine without status migrainosus, not intractable; S/P biliopancreatic diversion with duodenal switch; Previous cesarean delivery affecting pregnancy, antepartum; Gestational thrombocytopenia (HCC); Rh negative state in antepartum period; Generalized anxiety disorder; and BMI 38.0-38.9,adult on their problem list.  Patient reports no complaints.  Contractions: Irritability. Vag. Bleeding: None.  Movement: (!) Decreased. Denies leaking of fluid.   The following portions of the patient's history were reviewed and updated as appropriate: allergies, current medications, past family history, past medical history, past social history, past surgical history and problem list.   Objective:   Vitals:   01/25/22 1127  BP: 109/77  Pulse: 69  Weight: 226 lb (102.5 kg)    Fetal Status: Fetal Heart Rate (bpm): 125 Fundal Height: 38 cm Movement: (!) Decreased  Presentation: Vertex  General:  Alert, oriented and cooperative. Patient is in no acute distress.  Skin: Skin is warm and dry. No rash noted.   Cardiovascular: Normal heart rate noted  Respiratory: Normal respiratory effort, no problems with respiration noted  Abdomen: Soft, gravid, appropriate for gestational age.  Pain/Pressure: Present     Pelvic: Cervical exam performed in the presence of a chaperone Dilation: 1 Effacement (%): 80 Station: -2  Extremities: Normal range of motion.  Edema: Trace  Mental Status: Normal mood and affect. Normal behavior. Normal judgment and thought content.   Assessment and Plan:  Pregnancy: G3P1011 at [redacted]w[redacted]d 1. Supervision of other normal  pregnancy, antepartum Cultures today - Strep Gp B NAA - Cervicovaginal ancillary only( [place]) - Fetal nonstress test due to Decreased FM. NST:  Baseline: 125 bpm, Variability: Good {> 6 bpm), Accelerations: Reactive, and Decelerations: Absent   2. Previous cesarean delivery affecting pregnancy, antepartum For TOLAC  3. Rh negative state in antepartum period S/p rhogam at 28 weeks and pp if indicated  4. Benign gestational thrombocytopenia in third trimester (HCC) Repeat CBC today - CBC  Preterm labor symptoms and general obstetric precautions including but not limited to vaginal bleeding, contractions, leaking of fluid and fetal movement were reviewed in detail with the patient. Please refer to After Visit Summary for other counseling recommendations.   Return in 1 week (on 02/01/2022).  Future Appointments  Date Time Provider Department Center  01/26/2022 12:30 PM [facility] NURSE [facility]  01/26/2022 12:45 PM [facility] US4 [facility]  02/01/2022  3:30 PM Reva Bores, MD [facility]  02/08/2022 10:55 AM Reva Bores, MD [facility]  02/15/2022 10:55 AM Reva Bores, MD [facility]  02/22/2022 10:55 AM Reva Bores, MD [facility]    Reva Bores, MD

## 2022-01-26 ENCOUNTER — Ambulatory Visit: Payer: 59 | Admitting: *Deleted

## 2022-01-26 ENCOUNTER — Ambulatory Visit: Payer: 59 | Attending: Obstetrics and Gynecology

## 2022-01-26 VITALS — BP 127/76 | HR 62

## 2022-01-26 DIAGNOSIS — O99840 Bariatric surgery status complicating pregnancy, unspecified trimester: Secondary | ICD-10-CM | POA: Diagnosis not present

## 2022-01-26 DIAGNOSIS — Z3A36 36 weeks gestation of pregnancy: Secondary | ICD-10-CM

## 2022-01-26 DIAGNOSIS — O34219 Maternal care for unspecified type scar from previous cesarean delivery: Secondary | ICD-10-CM | POA: Diagnosis not present

## 2022-01-26 DIAGNOSIS — O360130 Maternal care for anti-D [Rh] antibodies, third trimester, not applicable or unspecified: Secondary | ICD-10-CM

## 2022-01-26 DIAGNOSIS — O09293 Supervision of pregnancy with other poor reproductive or obstetric history, third trimester: Secondary | ICD-10-CM | POA: Diagnosis not present

## 2022-01-26 DIAGNOSIS — O99843 Bariatric surgery status complicating pregnancy, third trimester: Secondary | ICD-10-CM | POA: Diagnosis not present

## 2022-01-26 DIAGNOSIS — O99213 Obesity complicating pregnancy, third trimester: Secondary | ICD-10-CM | POA: Diagnosis present

## 2022-01-26 DIAGNOSIS — Z980 Intestinal bypass and anastomosis status: Secondary | ICD-10-CM

## 2022-01-26 DIAGNOSIS — E669 Obesity, unspecified: Secondary | ICD-10-CM

## 2022-01-26 LAB — CBC
Hematocrit: 36.3 % (ref 34.0–46.6)
Hemoglobin: 12.3 g/dL (ref 11.1–15.9)
MCH: 30.1 pg (ref 26.6–33.0)
MCHC: 33.9 g/dL (ref 31.5–35.7)
MCV: 89 fL (ref 79–97)
Platelets: 141 10*3/uL — ABNORMAL LOW (ref 150–450)
RBC: 4.08 x10E6/uL (ref 3.77–5.28)
RDW: 12.4 % (ref 11.7–15.4)
WBC: 9.1 10*3/uL (ref 3.4–10.8)

## 2022-01-26 LAB — CERVICOVAGINAL ANCILLARY ONLY
Chlamydia: NEGATIVE
Comment: NEGATIVE
Comment: NEGATIVE
Comment: NORMAL
Neisseria Gonorrhea: NEGATIVE
Trichomonas: NEGATIVE

## 2022-01-27 LAB — STREP GP B NAA: Strep Gp B NAA: NEGATIVE

## 2022-02-01 ENCOUNTER — Telehealth (INDEPENDENT_AMBULATORY_CARE_PROVIDER_SITE_OTHER): Payer: 59 | Admitting: Family Medicine

## 2022-02-01 ENCOUNTER — Encounter: Payer: Self-pay | Admitting: Family Medicine

## 2022-02-01 VITALS — BP 126/67 | HR 59 | Wt 224.0 lb

## 2022-02-01 DIAGNOSIS — O09293 Supervision of pregnancy with other poor reproductive or obstetric history, third trimester: Secondary | ICD-10-CM

## 2022-02-01 DIAGNOSIS — Z6791 Unspecified blood type, Rh negative: Secondary | ICD-10-CM

## 2022-02-01 DIAGNOSIS — F411 Generalized anxiety disorder: Secondary | ICD-10-CM

## 2022-02-01 DIAGNOSIS — O26899 Other specified pregnancy related conditions, unspecified trimester: Secondary | ICD-10-CM

## 2022-02-01 DIAGNOSIS — Z3480 Encounter for supervision of other normal pregnancy, unspecified trimester: Secondary | ICD-10-CM

## 2022-02-01 DIAGNOSIS — D696 Thrombocytopenia, unspecified: Secondary | ICD-10-CM

## 2022-02-01 DIAGNOSIS — O99343 Other mental disorders complicating pregnancy, third trimester: Secondary | ICD-10-CM

## 2022-02-01 DIAGNOSIS — O34219 Maternal care for unspecified type scar from previous cesarean delivery: Secondary | ICD-10-CM

## 2022-02-01 DIAGNOSIS — Z3A37 37 weeks gestation of pregnancy: Secondary | ICD-10-CM

## 2022-02-01 DIAGNOSIS — O26893 Other specified pregnancy related conditions, third trimester: Secondary | ICD-10-CM

## 2022-02-01 DIAGNOSIS — Z8759 Personal history of other complications of pregnancy, childbirth and the puerperium: Secondary | ICD-10-CM

## 2022-02-01 DIAGNOSIS — O99113 Other diseases of the blood and blood-forming organs and certain disorders involving the immune mechanism complicating pregnancy, third trimester: Secondary | ICD-10-CM

## 2022-02-01 NOTE — Progress Notes (Signed)
OBSTETRICS PRENATAL VIRTUAL VISIT ENCOUNTER NOTE  [facility]   Patient location: Home  I connected with Danielle Sanchez on 02/01/22 at  3:30 PM EDT by MyChart Video Encounter and verified that I am speaking with the correct person using two identifiers. I discussed the limitations, risks, security and privacy concerns of performing an evaluation and management service virtually and the availability of in person appointments. I also discussed with the patient that there may be a patient responsible charge related to this service. The patient expressed understanding and agreed to proceed. Subjective:  Danielle Sanchez is a 36 y.o. G3P1011 at [redacted]w[redacted]d being seen today for ongoing prenatal care.  She is currently monitored for the following issues for this low-risk pregnancy and has Maternal obesity, antepartum; Elevated prolactin level; History of gestational hypertension; Prediabetes; Supervision of other normal pregnancy, antepartum; Migraine without status migrainosus, not intractable; S/P biliopancreatic diversion with duodenal switch; Previous cesarean delivery affecting pregnancy, antepartum; Gestational thrombocytopenia (HCC); Rh negative state in antepartum period; Generalized anxiety disorder; and BMI 38.0-38.9,adult on their problem list.  Patient reports no complaints.  Contractions: Irritability. Vag. Bleeding: None.  Movement: Present. Denies any leaking of fluid.   The following portions of the patient's history were reviewed and updated as appropriate: allergies, current medications, past family history, past medical history, past social history, past surgical history and problem list.   Objective:   Vitals:   02/01/22 1529  BP: 126/67  Pulse: (!) 59  Weight: 224 lb (101.6 kg)    Fetal Status:     Movement: Present     General:  Alert, oriented and cooperative. Patient is in no acute distress.  Respiratory: Normal respiratory  effort, no problems with respiration noted  Mental Status: Normal mood and affect. Normal behavior. Normal judgment and thought content.  Rest of physical exam deferred due to type of encounter  Imaging: [place] MFM OB FOLLOW UP  Result Date: 01/26/2022 ----------------------------------------------------------------------  OBSTETRICS REPORT                       (Signed Final 01/26/2022 01:39 pm) ---------------------------------------------------------------------- Patient Info  ID #:       [id number]                          D.O.B.:  22-Aug-1985 (35 yrs)  Name:       Danielle Sanchez                 Visit Date: 01/26/2022 12:44 pm ---------------------------------------------------------------------- Performed By  Attending:        Ma Rings MD         Ref. Address:     [address]                                                             [address]  Performed By:     Eden Lathe BS      [facility] RVT  [facility]  [facility]erred By:      [facility] ---------------------------------------------------------------------- Orders  #  Description                           Code        Ordered By  1  US MFM OB FOLLOW UP                   [id number]    Noralee SpaceAVI SHANKAR ----------------------------------------------------------------------  #  Order #                     Accession #                Episode #  1  [id number]                   [id number]                 [id number] ---------------------------------------------------------------------- Indications  Pregnancy complicated by previous gastric      O99.843  bypass, antepartum, third trimester  Obesity complicating pregnancy, third          O99.213  trimester (pregravid BMI 38)  Poor obstetric history: Previous               O09.299  preeclampsia /  eclampsia/gestational HTN  Medical complication of pregnancy              O26.90  (biliopancreatic diversion)  Poor obstetrical history (Thrombocyotpenia)    O09.299  Rh negative state in antepartum                O36.0190  History of cesarean delivery, currently        O34.219  pregnant  Declined genetic screening  [redacted] weeks gestation of pregnancy                Z3A.36 ---------------------------------------------------------------------- Fetal Evaluation  Num Of Fetuses:         1  Cardiac Activity:       Observed  Presentation:           Cephalic  Placenta:               Anterior  P. Cord Insertion:      Previously Visualized  Amniotic Fluid  AFI FV:      Within normal limits  AFI Sum(cm)     %Tile       Largest Pocket(cm)  15.91           59          5.86  RUQ(cm)       RLQ(cm)       LUQ(cm)        LLQ(cm)  5.77          2.09          5.86           2.19 ---------------------------------------------------------------------- Biometry  BPD:      92.7  mm     G. Age:  37w 5d         90  %    CI:        76.01   %    70 - 86                                                          FL/HC:      19.9   %    20.1 - 22.1  HC:    337.01   mm     G. Age:  38w 4d         77  %    HC/AC:      1.02        0.93 - 1.11  AC:      331.6  mm     G. Age:  37w 0d         81  %    FL/BPD:     72.4   %    71 - 87  FL:      67.14  mm     G. Age:  34w 4d          9  %    FL/AC:      20.2   %    20 - 24  LV:        2.4  mm  Est. FW:    3004  gm    6 lb 10 oz      64  % ---------------------------------------------------------------------- OB History  Blood Type:   O-  Gravidity:    3         Term:   1        Prem:   0        SAB:   1  TOP:          0       Ectopic:  0        Living: 1 ---------------------------------------------------------------------- Gestational Age  LMP:           36w 2d        Date:  05/17/21                 EDD:   02/21/22  U/S Today:     37w 0d                                        EDD:   02/16/22  Best:           36w 2d     Det. By:  LMP  (05/17/21)          EDD:   02/21/22 ---------------------------------------------------------------------- Anatomy  Cranium:               Appears normal         LVOT:  Previously seen  Cavum:                 Appears normal         Aortic Arch:            Previously seen  Ventricles:            Appears normal         Ductal Arch:            Previously seen  Choroid Plexus:        Previously seen        Diaphragm:              Previously seen  Cerebellum:            Previously seen        Stomach:                Appears normal, left                                                                        sided  Posterior Fossa:       Previously seen        Abdomen:                Appears normal  Nuchal Fold:           Previously seen        Abdominal Wall:         Previously seen  Face:                  Orbits and profile     Cord Vessels:           Previously seen                         previously seen  Lips:                  Previously seen        Kidneys:                Appear normal  Palate:                Previously seen        Bladder:                Appears normal  Thoracic:              Appears normal         Spine:                  Previously seen  Heart:                 Previously seen        Upper Extremities:      Previously seen  RVOT:                  Previously seen        Lower Extremities:      Previously seen  Other:  Female gender previously seen. Nasal bone, lenses, maxilla, mandible,          falx, Heels/feet, open hands/5th  digits previously visualized. VC, 3VV,          and 3VTV previously visualized. ---------------------------------------------------------------------- Cervix Uterus Adnexa  Cervix  Not visualized (advanced GA >24wks)  Uterus  No abnormality visualized.  Right Ovary  Within normal limits.  Left Ovary  Within normal limits.  Cul De Sac  No free fluid seen.  Adnexa  No abnormality visualized.  ---------------------------------------------------------------------- Comments  This patient was seen for a follow up growth scan due to a  history of gastric bypass surgery.  She denies any problems  since her last exam.  She was informed that the fetal growth and amniotic fluid  level appears appropriate for her gestational age.  As the fetal growth is within normal limits, no further exams  were scheduled in our office. ----------------------------------------------------------------------                   Ma Rings, MD Electronically Signed Final Report   01/26/2022 01:39 pm ----------------------------------------------------------------------   Assessment and Plan:  Pregnancy: G3P1011 at [redacted]w[redacted]d 1. Rh negative state in antepartum period S/p Rhogam  2. Benign gestational thrombocytopenia in third trimester (HCC) Stable plt count  3. Previous cesarean delivery affecting pregnancy, antepartum For TOLAC  4. Supervision of other normal pregnancy, antepartum Continue routine prenatal care.  5. History of gestational hypertension BP is ok today On ASA  6. Generalized anxiety disorder OK on Zoloft  Term labor symptoms and general obstetric precautions including but not limited to vaginal bleeding, contractions, leaking of fluid and fetal movement were reviewed in detail with the patient. I discussed the assessment and treatment plan with the patient. The patient was provided an opportunity to ask questions and all were answered. The patient agreed with the plan and demonstrated an understanding of the instructions. The patient was advised to call back or seek an in-person office evaluation/go to MAU at [facility] for any urgent or concerning symptoms. Please refer to After Visit Summary for other counseling recommendations.   I provided 14 minutes of face-to-face time during this encounter.  Return in 1 week (on 02/08/2022).  Future Appointments  Date Time Provider  Department Center  02/08/2022 10:55 AM Reva Bores, MD [facility]  02/15/2022 10:55 AM Reva Bores, MD [facility]  02/22/2022 10:55 AM Reva Bores, MD [facility]    Reva Bores, MD Center for [facility], [facility]

## 2022-02-01 NOTE — Progress Notes (Signed)
Virtual ROB visit [redacted]w[redacted]d  CC: None

## 2022-02-07 ENCOUNTER — Encounter: Payer: Self-pay | Admitting: Family Medicine

## 2022-02-07 IMAGING — MG DIGITAL DIAGNOSTIC BILAT W/ TOMO W/ CAD
8 of 14 series · 8 of 40 positions shown · non-contrast
Comparison: None.

CLINICAL DATA: 34-year-old female presenting for evaluation of
bilateral intermittent pain in the superior breast. She also has
bilateral milky nipple discharge which began in about 2681, but has
turned darker in color. She has had her prolactin checked which was
negative.

EXAM:
DIGITAL DIAGNOSTIC BILATERAL MAMMOGRAM WITH TOMOSYNTHESIS AND CAD
TECHNIQUE: Bilateral digital diagnostic mammography and breast tomosynthesis
was performed. Digital images of the breasts were evaluated with
computer-aided detection.

[L CC synth-2D (1 of 2)]
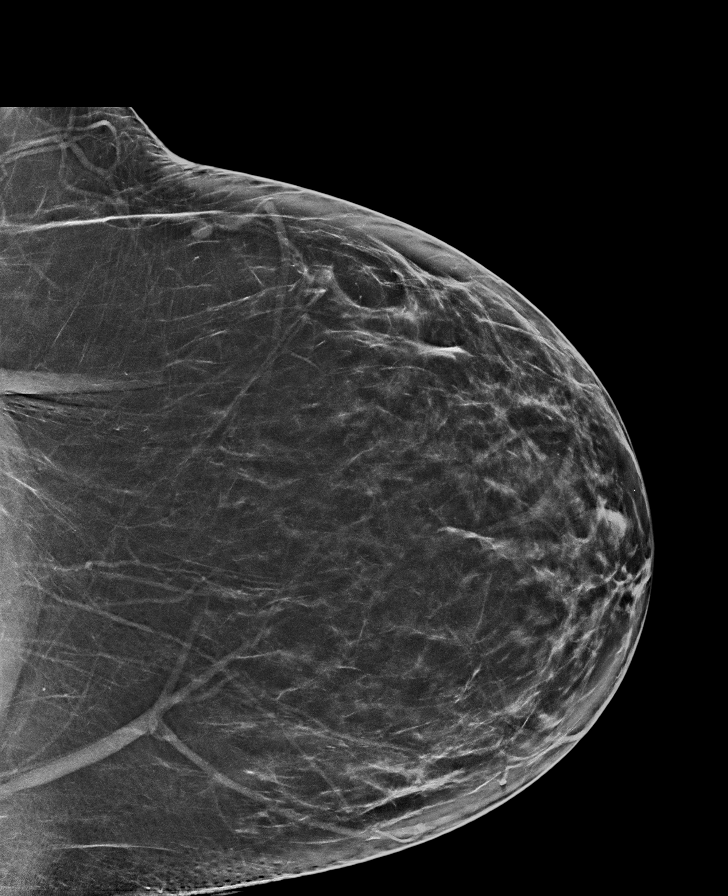

[L CC synth-2D (2 of 2)]
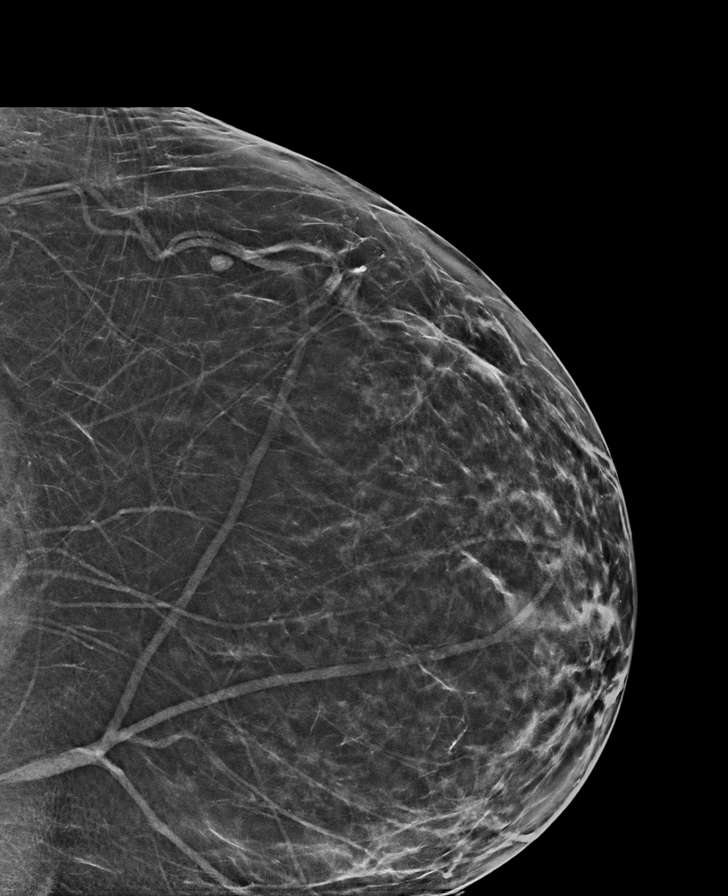

[L MLO synth-2D (1 of 2)]
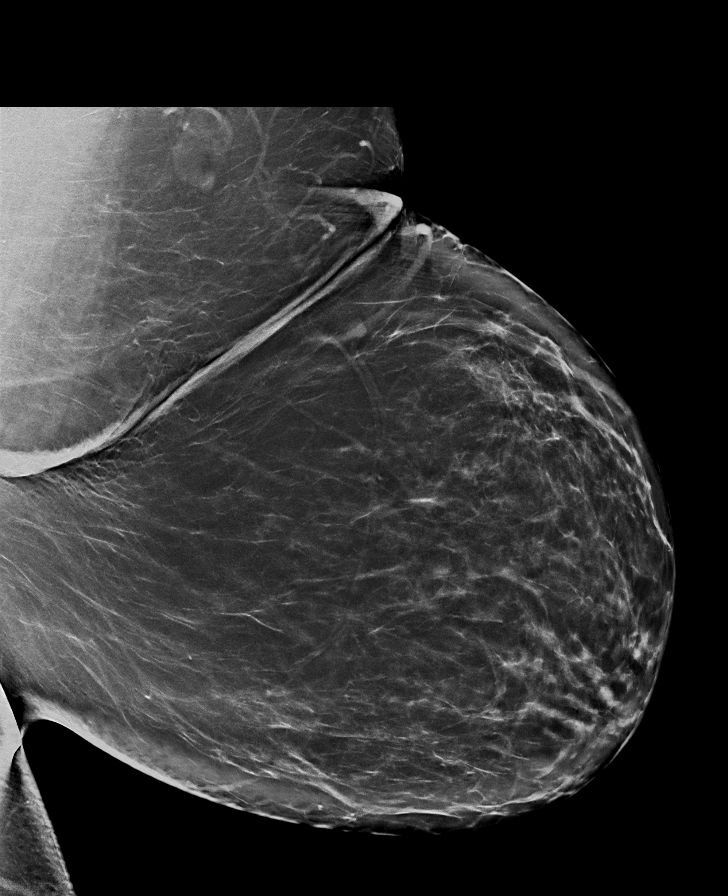

[L MLO synth-2D (2 of 2)]
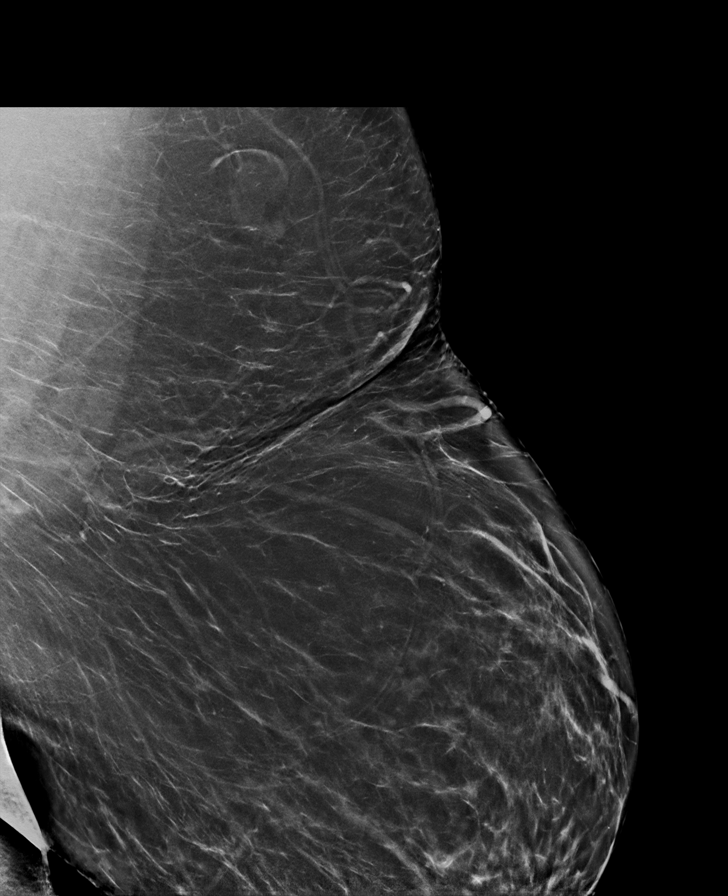

[R MLO synth-2D]
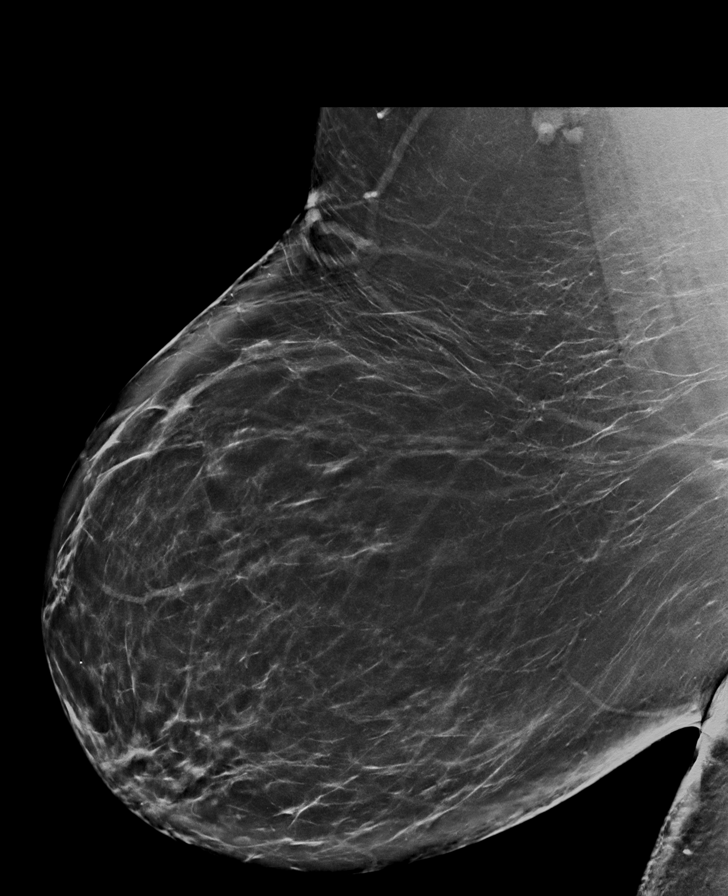

[R CV synth-2D]
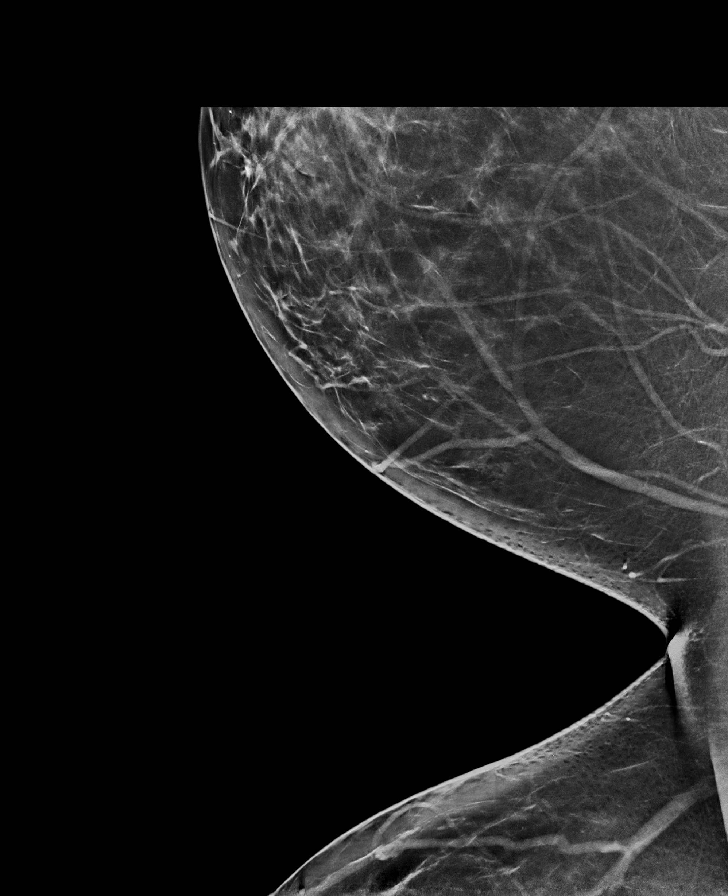

[R CC synth-2D]
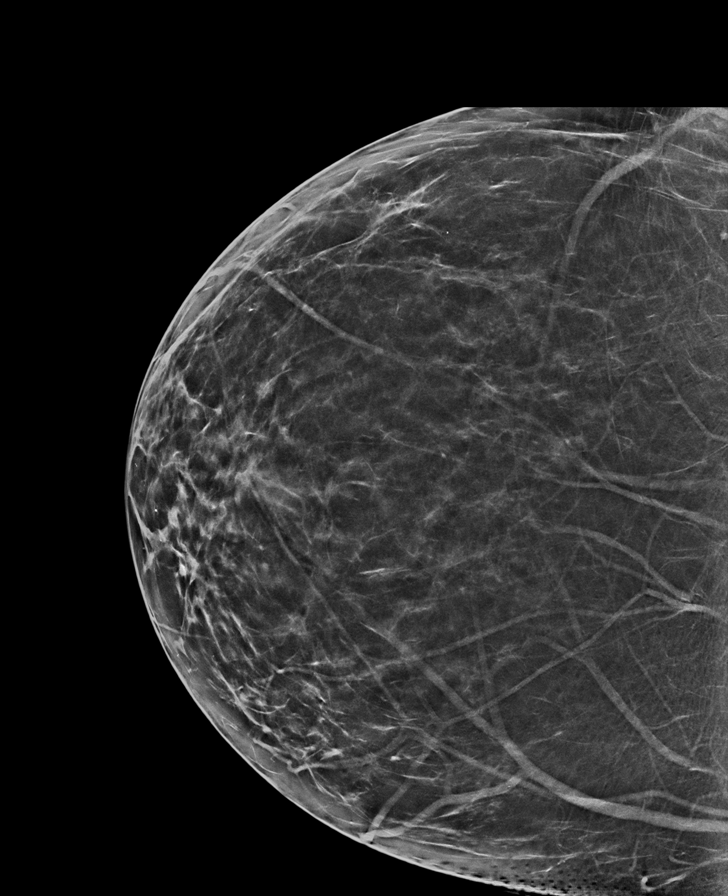

[L CC tomo · tomo slice 39/76.0]
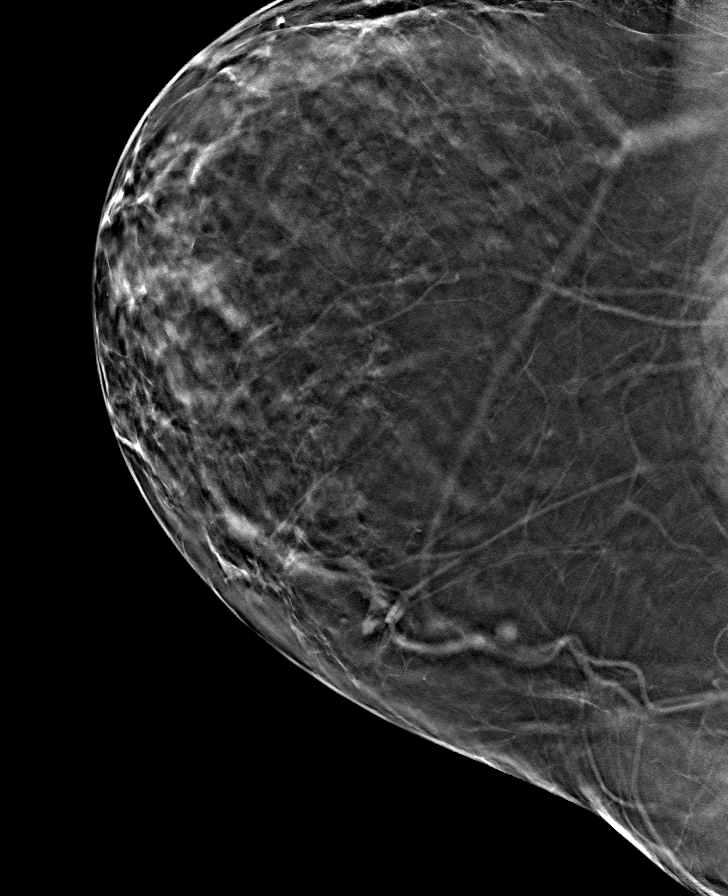

[8 of 40 positions shown; findings below may reference images not displayed]

ACR Breast Density Category b: There are scattered areas of
fibroglandular density.
FINDINGS: No suspicious calcifications, masses or areas of distortion are seen
in the bilateral breasts.
IMPRESSION: 1. There are no mammographic findings in either breast to explain
the patient's bilateral breast pain or chronic bilateral nipple
discharge.

2.  No mammographic evidence of malignancy in the bilateral breasts.

RECOMMENDATION:
1. Further management of nipple discharge should be based on
clinical assessment. This is felt to be physiologic/related to a
benign process given that it is chronic, bilateral and milky. The
patient was advised to alert her doctor if she experiences
spontaneous unilateral bloody or clear nipple discharge from a
single duct only.

2. Breast pain is a common condition, which will often resolve on
its own without intervention. It can be affected by hormonal
changes, weight changes and fit of the bra. Pain may also be
referred from other areas of the body. Breast pain may be improved
by wearing adequate well-fitting support, over-the-counter topical
and oral NSAID medication, low-fat diet, and ice/heat as needed.
Studies have shown an improvement in cyclic pain with use of evening
primrose oil and vitamin E. Clinical follow-up recommended to
discuss any further work-up recommendations and appropriate
treatment.

3. Screening mammogram at age 40 unless there are persistent or
intervening clinical concerns. (Code:MR-O-H76)

I have discussed the findings and recommendations with the patient.
If applicable, a reminder letter will be sent to the patient
regarding the next appointment.

BI-RADS CATEGORY  1: Negative.

## 2022-02-08 ENCOUNTER — Encounter: Payer: Self-pay | Admitting: Family Medicine

## 2022-02-08 ENCOUNTER — Ambulatory Visit (INDEPENDENT_AMBULATORY_CARE_PROVIDER_SITE_OTHER): Payer: 59 | Admitting: Family Medicine

## 2022-02-08 VITALS — BP 115/79 | HR 55 | Wt 224.0 lb

## 2022-02-08 DIAGNOSIS — O34219 Maternal care for unspecified type scar from previous cesarean delivery: Secondary | ICD-10-CM

## 2022-02-08 DIAGNOSIS — Z3483 Encounter for supervision of other normal pregnancy, third trimester: Secondary | ICD-10-CM

## 2022-02-08 DIAGNOSIS — Z3480 Encounter for supervision of other normal pregnancy, unspecified trimester: Secondary | ICD-10-CM

## 2022-02-08 DIAGNOSIS — Z6791 Unspecified blood type, Rh negative: Secondary | ICD-10-CM

## 2022-02-08 DIAGNOSIS — Z3A38 38 weeks gestation of pregnancy: Secondary | ICD-10-CM

## 2022-02-08 DIAGNOSIS — D696 Thrombocytopenia, unspecified: Secondary | ICD-10-CM

## 2022-02-08 DIAGNOSIS — O99113 Other diseases of the blood and blood-forming organs and certain disorders involving the immune mechanism complicating pregnancy, third trimester: Secondary | ICD-10-CM

## 2022-02-08 DIAGNOSIS — O26893 Other specified pregnancy related conditions, third trimester: Secondary | ICD-10-CM

## 2022-02-08 NOTE — Addendum Note (Signed)
Addended by: Reva Bores on: 02/08/2022 05:32 PM   Modules accepted: Orders

## 2022-02-08 NOTE — Progress Notes (Signed)
   PRENATAL VISIT NOTE  Subjective:  Danielle Sanchez is a 36 y.o. G3P1011 at [redacted]w[redacted]d being seen today for ongoing prenatal care.  She is currently monitored for the following issues for this high-risk pregnancy and has Maternal obesity, antepartum; Elevated prolactin level; History of gestational hypertension; Prediabetes; Supervision of other normal pregnancy, antepartum; Migraine without status migrainosus, not intractable; S/P biliopancreatic diversion with duodenal switch; Previous cesarean delivery affecting pregnancy, antepartum; Gestational thrombocytopenia (HCC); Rh negative state in antepartum period; Generalized anxiety disorder; and BMI 38.0-38.9,adult on their problem list.  Patient reports no complaints.  Contractions: Irregular. Vag. Bleeding: None.  Movement: Present. Denies leaking of fluid.   The following portions of the patient's history were reviewed and updated as appropriate: allergies, current medications, past family history, past medical history, past social history, past surgical history and problem list.   Objective:   Vitals:   02/08/22 1135  BP: 115/79  Pulse: (!) 55  Weight: 224 lb (101.6 kg)    Fetal Status: Fetal Heart Rate (bpm): 127 Fundal Height: 35 cm Movement: Present  Presentation: Vertex  General:  Alert, oriented and cooperative. Patient is in no acute distress.  Skin: Skin is warm and dry. No rash noted.   Cardiovascular: Normal heart rate noted  Respiratory: Normal respiratory effort, no problems with respiration noted  Abdomen: Soft, gravid, appropriate for gestational age.  Pain/Pressure: Present     Pelvic: Cervical exam performed in the presence of a chaperone Dilation: 1 Effacement (%): 60, 70    Extremities: Normal range of motion.  Edema: None  Mental Status: Normal mood and affect. Normal behavior. Normal judgment and thought content.   Assessment and Plan:  Pregnancy: G3P1011 at [redacted]w[redacted]d 1. Previous cesarean delivery affecting pregnancy,  antepartum For TOLAC - considering IOL at 39+ weeks  2. Rh negative state in antepartum period S/p Rhogam, and will need to repeat pp if indicated  3. Supervision of other normal pregnancy, antepartum   4. Benign gestational thrombocytopenia in third trimester (HCC) Plts are stable  Term labor symptoms and general obstetric precautions including but not limited to vaginal bleeding, contractions, leaking of fluid and fetal movement were reviewed in detail with the patient. Please refer to After Visit Summary for other counseling recommendations.   No follow-ups on file.  Future Appointments  Date Time Provider Department Center  02/15/2022 10:55 AM Reva Bores, MD [facility]  02/22/2022 10:55 AM Reva Bores, MD [facility]    Reva Bores, MD

## 2022-02-09 ENCOUNTER — Encounter (HOSPITAL_COMMUNITY): Payer: Self-pay

## 2022-02-09 ENCOUNTER — Telehealth (HOSPITAL_COMMUNITY): Payer: Self-pay | Admitting: *Deleted

## 2022-02-09 NOTE — Telephone Encounter (Signed)
Preadmission screen  

## 2022-02-10 ENCOUNTER — Telehealth (HOSPITAL_COMMUNITY): Payer: Self-pay | Admitting: *Deleted

## 2022-02-10 ENCOUNTER — Encounter (HOSPITAL_COMMUNITY): Payer: Self-pay | Admitting: *Deleted

## 2022-02-10 NOTE — Telephone Encounter (Signed)
Preadmission screen  

## 2022-02-13 ENCOUNTER — Encounter: Payer: Self-pay | Admitting: Family Medicine

## 2022-02-15 ENCOUNTER — Encounter: Payer: 59 | Admitting: Family Medicine

## 2022-02-15 ENCOUNTER — Other Ambulatory Visit: Payer: Self-pay | Admitting: Advanced Practice Midwife

## 2022-02-16 ENCOUNTER — Inpatient Hospital Stay (HOSPITAL_COMMUNITY)
Admission: AD | Admit: 2022-02-16 | Discharge: 2022-02-21 | DRG: 788 | Disposition: A | Discharge status: Home / Self Care | Payer: 59 | Attending: Obstetrics and Gynecology | Admitting: Obstetrics and Gynecology

## 2022-02-16 ENCOUNTER — Inpatient Hospital Stay (HOSPITAL_COMMUNITY): Payer: 59

## 2022-02-16 ENCOUNTER — Encounter (HOSPITAL_COMMUNITY): Payer: Self-pay | Admitting: Obstetrics and Gynecology

## 2022-02-16 ENCOUNTER — Telehealth: Payer: Self-pay | Admitting: *Deleted

## 2022-02-16 ENCOUNTER — Other Ambulatory Visit: Payer: Self-pay

## 2022-02-16 ENCOUNTER — Other Ambulatory Visit: Payer: Self-pay | Admitting: Advanced Practice Midwife

## 2022-02-16 DIAGNOSIS — O26893 Other specified pregnancy related conditions, third trimester: Secondary | ICD-10-CM | POA: Diagnosis present

## 2022-02-16 DIAGNOSIS — O34219 Maternal care for unspecified type scar from previous cesarean delivery: Secondary | ICD-10-CM

## 2022-02-16 DIAGNOSIS — O99214 Obesity complicating childbirth: Secondary | ICD-10-CM | POA: Diagnosis present

## 2022-02-16 DIAGNOSIS — O9912 Other diseases of the blood and blood-forming organs and certain disorders involving the immune mechanism complicating childbirth: Secondary | ICD-10-CM | POA: Diagnosis present

## 2022-02-16 DIAGNOSIS — Z7982 Long term (current) use of aspirin: Secondary | ICD-10-CM

## 2022-02-16 DIAGNOSIS — O34211 Maternal care for low transverse scar from previous cesarean delivery: Secondary | ICD-10-CM | POA: Diagnosis present

## 2022-02-16 DIAGNOSIS — D6959 Other secondary thrombocytopenia: Secondary | ICD-10-CM | POA: Diagnosis present

## 2022-02-16 DIAGNOSIS — O321XX0 Maternal care for breech presentation, not applicable or unspecified: Secondary | ICD-10-CM | POA: Diagnosis present

## 2022-02-16 DIAGNOSIS — Z3A39 39 weeks gestation of pregnancy: Secondary | ICD-10-CM

## 2022-02-16 DIAGNOSIS — Z3490 Encounter for supervision of normal pregnancy, unspecified, unspecified trimester: Secondary | ICD-10-CM

## 2022-02-16 NOTE — Telephone Encounter (Signed)
Called pt to follow up on babyscripts BP of 116/106, pt re took BP and it was 124/88. Pt has a slight HA but has not taken any tylenol. Advised pt can take tylenol, and if HA becomes worse or she feels bad she can go to the hospital. She is waiting currently to be called in for her IOL. Pt verbalizes and understands

## 2022-02-17 ENCOUNTER — Inpatient Hospital Stay (HOSPITAL_COMMUNITY): Payer: 59 | Admitting: Anesthesiology

## 2022-02-17 ENCOUNTER — Encounter (HOSPITAL_COMMUNITY): Payer: Self-pay | Admitting: Obstetrics and Gynecology

## 2022-02-17 DIAGNOSIS — Z3490 Encounter for supervision of normal pregnancy, unspecified, unspecified trimester: Secondary | ICD-10-CM

## 2022-02-17 LAB — RPR: RPR Ser Ql: NONREACTIVE

## 2022-02-17 LAB — TYPE AND SCREEN
ABO/RH(D): O NEG
Antibody Screen: POSITIVE

## 2022-02-17 LAB — CBC
HCT: 35.8 % — ABNORMAL LOW (ref 36.0–46.0)
Hemoglobin: 11.9 g/dL — ABNORMAL LOW (ref 12.0–15.0)
MCH: 29.9 pg (ref 26.0–34.0)
MCHC: 33.2 g/dL (ref 30.0–36.0)
MCV: 89.9 fL (ref 80.0–100.0)
Platelets: 140 10*3/uL — ABNORMAL LOW (ref 150–400)
RBC: 3.98 MIL/uL (ref 3.87–5.11)
RDW: 12.9 % (ref 11.5–15.5)
WBC: 8.6 10*3/uL (ref 4.0–10.5)
nRBC: 0 % (ref 0.0–0.2)

## 2022-02-17 MED ORDER — OXYTOCIN-SODIUM CHLORIDE 30-0.9 UT/500ML-% IV SOLN
1.0000 m[IU]/min | INTRAVENOUS | Status: DC
  Administered 2022-02-17: 2 m[IU]/min via INTRAVENOUS
  Filled 2022-02-17: qty 500

## 2022-02-17 MED ORDER — LIDOCAINE-EPINEPHRINE (PF) 1.5 %-1:200000 IJ SOLN
INTRAMUSCULAR | Status: DC | PRN
  Administered 2022-02-17: 5 mL via PERINEURAL

## 2022-02-17 MED ORDER — ONDANSETRON HCL 4 MG/2ML IJ SOLN
4.0000 mg | Freq: Four times a day (QID) | INTRAMUSCULAR | Status: DC | PRN
Start: 2022-02-17 — End: 2022-02-18

## 2022-02-17 MED ORDER — LACTATED RINGERS IV SOLN: 500.0000 mL | INTRAVENOUS | Status: DC | PRN

## 2022-02-17 MED ORDER — EPHEDRINE 5 MG/ML INJ
10.0000 mg | INTRAVENOUS | Status: DC | PRN
Start: 2022-02-17 — End: 2022-02-18

## 2022-02-17 MED ORDER — OXYCODONE-ACETAMINOPHEN 5-325 MG PO TABS: 1.0000 | ORAL_TABLET | ORAL | Status: DC | PRN

## 2022-02-17 MED ORDER — LIDOCAINE HCL (PF) 1 % IJ SOLN
INTRAMUSCULAR | Status: DC | PRN
  Administered 2022-02-17: 3 mL via EPIDURAL

## 2022-02-17 MED ORDER — TERBUTALINE SULFATE 1 MG/ML IJ SOLN: 0.2500 mg | Freq: Once | INTRAMUSCULAR | Status: DC | PRN

## 2022-02-17 MED ORDER — FLEET ENEMA 7-19 GM/118ML RE ENEM: 1.0000 | ENEMA | RECTAL | Status: DC | PRN

## 2022-02-17 MED ORDER — LACTATED RINGERS IV SOLN: 500.0000 mL | Freq: Once | INTRAVENOUS | Status: DC

## 2022-02-17 MED ORDER — PHENYLEPHRINE 80 MCG/ML (10ML) SYRINGE FOR IV PUSH (FOR BLOOD PRESSURE SUPPORT): 80.0000 ug | PREFILLED_SYRINGE | INTRAVENOUS | Status: DC | PRN

## 2022-02-17 MED ORDER — LACTATED RINGERS IV SOLN: INTRAVENOUS | Status: DC

## 2022-02-17 MED ORDER — LIDOCAINE HCL (PF) 1 % IJ SOLN: 30.0000 mL | INTRAMUSCULAR | Status: DC | PRN

## 2022-02-17 MED ORDER — DIPHENHYDRAMINE HCL 50 MG/ML IJ SOLN: 12.5000 mg | INTRAMUSCULAR | Status: DC | PRN

## 2022-02-17 MED ORDER — OXYCODONE-ACETAMINOPHEN 5-325 MG PO TABS: 2.0000 | ORAL_TABLET | ORAL | Status: DC | PRN

## 2022-02-17 MED ORDER — OXYTOCIN-SODIUM CHLORIDE 30-0.9 UT/500ML-% IV SOLN: 2.5000 [IU]/h | INTRAVENOUS | Status: DC

## 2022-02-17 MED ORDER — EPHEDRINE 5 MG/ML INJ: 10.0000 mg | INTRAVENOUS | Status: DC | PRN

## 2022-02-17 MED ORDER — ZOLPIDEM TARTRATE 5 MG PO TABS
5.0000 mg | ORAL_TABLET | Freq: Every evening | ORAL | Status: DC | PRN
  Administered 2022-02-17: 5 mg via ORAL
  Filled 2022-02-17: qty 1

## 2022-02-17 MED ORDER — ACETAMINOPHEN 325 MG PO TABS
650.0000 mg | ORAL_TABLET | ORAL | Status: DC | PRN
Start: 2022-02-17 — End: 2022-02-18

## 2022-02-17 MED ORDER — OXYTOCIN BOLUS FROM INFUSION: 333.0000 mL | Freq: Once | INTRAVENOUS | Status: DC

## 2022-02-17 MED ORDER — SOD CITRATE-CITRIC ACID 500-334 MG/5ML PO SOLN
30.0000 mL | ORAL | Status: DC | PRN
  Administered 2022-02-18: 30 mL via ORAL
  Filled 2022-02-17: qty 30

## 2022-02-17 MED ORDER — FENTANYL-BUPIVACAINE-NACL 0.5-0.125-0.9 MG/250ML-% EP SOLN
12.0000 mL/h | EPIDURAL | Status: DC | PRN
  Administered 2022-02-17: 12 mL/h via EPIDURAL
  Filled 2022-02-17: qty 250

## 2022-02-17 MED ORDER — OXYTOCIN-SODIUM CHLORIDE 30-0.9 UT/500ML-% IV SOLN: 1.0000 m[IU]/min | INTRAVENOUS | Status: DC

## 2022-02-17 NOTE — Anesthesia Procedure Notes (Signed)
Epidural Patient location during procedure: OB Start time: 02/17/2022 4:40 PM End time: 02/17/2022 4:50 PM  Staffing Anesthesiologist: Lucretia Kern, MD Performed: anesthesiologist   Preanesthetic Checklist Completed: patient identified, IV checked, risks and benefits discussed, monitors and equipment checked, pre-op evaluation and timeout performed  Epidural Patient position: sitting Prep: DuraPrep Patient monitoring: heart rate, continuous pulse ox and blood pressure Approach: midline Location: L3-L4 Injection technique: LOR air  Needle:  Needle type: Tuohy  Needle gauge: 17 G Needle length: 9 cm Needle insertion depth: 6 cm Catheter type: closed end flexible Catheter size: 19 Gauge Catheter at skin depth: 11 cm Test dose: negative  Assessment Events: blood not aspirated, injection not painful, no injection resistance, no paresthesia and negative IV test  Additional Notes Reason for block:procedure for pain

## 2022-02-17 NOTE — Progress Notes (Signed)
   Danielle Sanchez is a 36 y.o. G3P1011 at [redacted]w[redacted]d  admitted for eIOL at 39 weeks; she is planning at [facility].   Subjective: Patient now happy with epidural.   Objective: Vitals:   02/17/22 1717 02/17/22 1722 02/17/22 1727 02/17/22 1732  BP: 117/69 (!) 135/103 117/77 120/72  Pulse: 60 93 (!) 58 (!) 57  Resp: 18  16   Temp:      TempSrc:      SpO2:      Weight:      Height:       No intake/output data recorded.  FHT:  FHR: 120 bpm, variability: moderate,  accelerations:  Present,  decelerations:  Absent UC:   patient reports contracting every 2 minutes; has been difficult to monitor due to patient's  SVE:   Dilation: 5.5 Effacement (%): 70 Station: -2 Exam by:: Danielle Sanchez, cnm Pitocin @ 14 mu/min  Labs: Lab Results  Component Value Date   WBC 8.6 02/17/2022   HGB 11.9 (L) 02/17/2022   HCT 35.8 (L) 02/17/2022   MCV 89.9 02/17/2022   PLT 140 (L) 02/17/2022    Assessment / Plan:  Dr. Charlotta Sanchez aware of patient's inability to trace ctx and pitocin at 14. She and I agree that AROM/IUPC is necessary; however, up to this point patient has not wanted any AROM or interventions. After a long discussion with patient, she agreed to AROM and IUPC. Cervix is unchanged.    AROM clear fluid and IUPC placed in order to better track contractions. Pitocin decreased to 10 given the frequency of ctx and history of TOLAC>  patient agreeable to plan of care and is enthusiastic about continuing to [facility]   Labor:  early labor cervix unchanged but now AROM and IUPC Fetal Wellbeing:  Category I Pain Control:  Epidural Anticipated MOD:  NSVD  Danielle Sanchez 02/17/2022, 5:57 PM

## 2022-02-17 NOTE — Progress Notes (Signed)
TC received from RN about patient's MVU at 190 but contraction frequency is q1 min.  We will decreased pitocin to 6 m/U (from 10) and continue to monitor.   Danielle Sanchez

## 2022-02-17 NOTE — Progress Notes (Signed)
   Danielle Sanchez is a 36 y.o. G3P1011 at [redacted]w[redacted]d  admitted for eIOL. She has a history of c/section due to NRFHT and states that she dilated to 7 cms.   Subjective: Doing well,   Objective: Vitals:   02/16/22 2347 02/17/22 0723 02/17/22 0913  BP: 136/72 121/69 113/75  Pulse: 64 (!) 53 61  Resp: 16 16 16   Temp: 98.3 F (36.8 C) 97.9 F (36.6 C)   TempSrc: Oral Oral   SpO2: 100%    Weight: 104.5 kg    Height: 5\' 4"  (1.626 m)     No intake/output data recorded.  FHT:  FHR: 120 bpm, variability: moderate,  accelerations:  Present,  decelerations:  Absent UC:   uterine irratability SVE:   Dilation: 5.5 Effacement (%): 70 Station: -2 Exam by:: RN Pitocin @ 0 mu/min  Labs: Lab Results  Component Value Date   WBC 8.6 02/17/2022   HGB 11.9 (L) 02/17/2022   HCT 35.8 (L) 02/17/2022   MCV 89.9 02/17/2022   PLT 140 (L) 02/17/2022    Assessment / Plan: Early labor, patient is ambulating and has eaten. Is ok with starting pitocin now that cook catheter is out.  -patient still desires to [facility], she wants to do nitrous if necessary, is concerned about epidural and the medications in epidural (partner is concerned about fentanyl use).  -talked about steps for IOL, all questions answered.  Labor:  early labor, will start pitocin Fetal Wellbeing:  Category I Pain Control:  Labor support without medications Anticipated MOD:  NSVD  02/19/2022 02/17/2022, 10:56 AM

## 2022-02-17 NOTE — H&P (Signed)
Danielle Sanchez is a 36 y.o. female G3P1011 with IUP at [redacted]w[redacted]d by [place] presenting for elective IOL.  She reports positive fetal movement. She denies leakage of fluid or vaginal bleeding.  Prenatal History/Complications: PNC at [facility] 4 years ago for NRFHT. Cx was 5-6 cms per pt, IOL at 38 weeks for GHTN.  Pregnancy complications:  - Past Medical History: Past Medical History:  Diagnosis Date   Allergy    Anxiety    Gestational thrombocytopenia (HCC)    Headache    History of bunionectomy    PONV (postoperative nausea and vomiting)    Scopolamine patch not helpful. TIVA with propofol on 05/04/21 (with decadron/zofran) - no PONV   Pregnancy induced hypertension    Spontaneous miscarriage 02/03/2021    Past Surgical History: Past Surgical History:  Procedure Laterality Date   ANTERIOR CRUCIATE LIGAMENT REPAIR Right    bone spur Left    BUNIONECTOMY Right 05/04/2021   Procedure: BUNIONECTOMY- LAPIDUS-TYPE;  Surgeon: Gwyneth Revels, DPM;  Location: [facility];  Service: Podiatry;  Laterality: Right;   CESAREAN SECTION N/A 11/01/2017   Procedure: CESAREAN SECTION;  Surgeon: Ward, Elenora Fender, MD;  Location: [facility];  Service: Obstetrics;  Laterality: N/A;   DILATION AND CURETTAGE OF UTERUS N/A 02/03/2021   Procedure: DILATATION AND CURETTAGE;  Surgeon: Christeen Douglas, MD;  Location: [facility];  Service: Gynecology;  Laterality: N/A;   LAPAROSCOPIC GASTRIC RESTRICTIVE DUODENAL PROCEDURE (DUODENAL SWITCH)  10/2020   MENISCUS REPAIR Right    WISDOM TOOTH EXTRACTION  2017    Obstetrical History: OB History     Gravida  3   Para  1   Term  1   Preterm      AB  1   Living  1      SAB  1   IAB      Ectopic      Multiple  0   Live Births  1            Social History: Social History   Socioeconomic History   Marital status: Married    Spouse name: Not on file   Number of children: Not on file   Years of education: Not on file    Highest education level: Not on file  Occupational History   Not on file  Tobacco Use   Smoking status: Never   Smokeless tobacco: Never  Vaping Use   Vaping Use: Never used  Substance and Sexual Activity   Alcohol use: No   Drug use: No   Sexual activity: Yes    Birth control/protection: None  Other Topics Concern   Not on file  Social History Narrative   Not on file   Social Determinants of Health   Financial Resource Strain: Not on file  Food Insecurity: Not on file  Transportation Needs: Not on file  Physical Activity: Not on file  Stress: Not on file  Social Connections: Not on file    Family History: Family History  Problem Relation Age of Onset   Asthma Mother    Hypotension Mother    Asthma Sister    Stroke Maternal Grandmother    Diabetes Maternal Grandmother    Hypertension Maternal Grandmother    Breast cancer Neg Hx    Cancer Neg Hx     Allergies: Allergies  Allergen Reactions   Promethazine Other (See Comments)    Abdominal pain/dizziness/fatigue   Azithromycin Swelling    Medications Prior to Admission  Medication Sig Dispense Refill Last Dose   aspirin EC 81 MG tablet Take 1 tablet (81 mg total) by mouth daily. 90 tablet 3    Calcium Citrate-Vitamin D (CITRACAL + D PO)       ferrous sulfate 325 (65 FE) MG tablet Take 325 mg by mouth every other day.      Multiple Minerals-Vitamins (CAL MAG ZINC +D3 PO) Take 2 tablets by mouth in the morning, at noon, and at bedtime.      NASCOBAL 500 MCG/0.1ML SOLN Place 1 spray into both nostrils every Saturday.      Omega-3 1000 MG CAPS       sertraline (ZOLOFT) 100 MG tablet Take 100 mg by mouth daily.       Review of Systems   Constitutional: Negative for fever and chills Eyes: Negative for visual disturbances Respiratory: Negative for shortness of breath, dyspnea Cardiovascular: Negative for chest pain or palpitations  Gastrointestinal: Negative for vomiting, diarrhea and constipation.  POSITIVE  for abdominal pain (contractions) Genitourinary: Negative for dysuria and urgency Musculoskeletal: Negative for back pain, joint pain, myalgias  Neurological: Negative for dizziness and headaches  Blood pressure 136/72, pulse 64, temperature 98.3 F (36.8 C), temperature source Oral, resp. rate 16, height 5\' 4"  (1.626 m), weight 104.5 kg, last menstrual period 05/17/2021, SpO2 100 %. General appearance: alert, cooperative, and no distress Lungs: normal respiratory effort Heart: regular rate and rhythm Abdomen: soft, non-tender; bowel sounds normal Extremities: Homans sign is negative, no sign of DVT DTR's 2+ Presentation: cephalic Fetal monitoring  Baseline: 140 bpm, Variability: Good {> 6 bpm), Accelerations: Reactive, and Decelerations: Absent Uterine activity  None Dilation: 1 Effacement (%): Thick Station: -2 Exam by:: [email or web address] CNM   Prenatal labs: ABO, Rh: O/Negative/-- (02/01 1022) Antibody: Negative (05/31 0918) Rubella: 1.69 (02/01 1022) RPR: Non Reactive (05/31 0918)  HBsAg: Negative (02/01 1022)  HIV: Non Reactive (05/31 0917)  GBS: Negative/-- (07/26 1200)   Nursing Staff Provider  Office Location   Dating  Early U/S + LMP  [facility] Model [ x] Traditional [ ]  Centering [ ]  Mom-Baby Dyad    Language  english Anatomy [facility]  WNL but limited-f/u 4 wks  Flu Vaccine  07/11/2021 Genetic/Carrier Screen  NIPS:   Declines AFP: declines   Horizon:Declines  TDaP Vaccine   11/30/21 Hgb A1C or  GTT Early 5.1 Third trimester 65/100/54  COVID Vaccine X 3    LAB RESULTS   Rhogam  11/30/2021 Blood Type O/Negative/-- (02/01 1022)   Baby Feeding Plan Breast Antibody Negative (05/31 0918)  Contraception Nexplanon outpt Rubella 1.69 (02/01 1022)  Circumcision no RPR Non Reactive (05/31 0918)   Pediatrician  [facility] HBsAg Negative (02/01 1022)   Support Person FOB HCVAb Negative  Prenatal Classes yes HIV Non Reactive (05/31 0917)       GBS Negative/-- (07/26 1200)  VBAC Consent  11/30/2021 Pap 07/30/2020 WNL       DME Rx [ ]  BP cuff [ ]  Weight Scale Waterbirth  [ ]  Class [ ]  Consent [ ]  CNM visit  PHQ9 & GAD7 [  ] new OB [  ] 28 weeks  [  ] 36 weeks Induction  [ ]  Orders Entered [ ] Foley Y/N   Prenatal Transfer Tool  Maternal Diabetes: No Genetic Screening: Declined Maternal Ultrasounds/Referrals: Normal Fetal Ultrasounds or other Referrals:  None Maternal Substance Abuse:  No Significant Maternal Medications:  None Significant Maternal Lab Results: Group B Strep negative  No results found for  this or any previous visit (from the past 24 hour(s)).  Assessment: Danielle Sanchez is a 36 y.o. G3P1011 with an IUP at [redacted]w[redacted]d presenting for elective IOL.  TOLAC  Plan: #Labor: Cooks catheter placed and inflated w/80cc H20.  Pt tolerated very well, no c/o pain. Cx already soft, so will not need to ripen cx w/low dose pitocin.  #Pain:  Per request #FWB Cat 1 #ID: GBS: neg    Jacklyn Shell 02/17/2022, 12:57 AM

## 2022-02-17 NOTE — Anesthesia Preprocedure Evaluation (Signed)
Anesthesia Evaluation  Patient identified by MRN, date of birth, ID band Patient awake    Reviewed: Allergy & Precautions, H&P , NPO status , Patient's Chart, lab work & pertinent test results  History of Anesthesia Complications Negative for: history of anesthetic complications  Airway Mallampati: II  TM Distance: >3 FB     Dental   Pulmonary neg pulmonary ROS,    Pulmonary exam normal        Cardiovascular hypertension,  Rhythm:regular Rate:Normal     Neuro/Psych Anxiety negative neurological ROS  negative psych ROS   GI/Hepatic negative GI ROS, Neg liver ROS,   Endo/Other  Morbid obesity  Renal/GU      Musculoskeletal   Abdominal   Peds  Hematology negative hematology ROS (+)   Anesthesia Other Findings   Reproductive/Obstetrics (+) Pregnancy TOLAC                             Anesthesia Physical Anesthesia Plan  ASA: 3  Anesthesia Plan: Epidural   Post-op Pain Management:    Induction:   PONV Risk Score and Plan:   Airway Management Planned:   Additional Equipment:   Intra-op Plan:   Post-operative Plan:   Informed Consent: I have reviewed the patients History and Physical, chart, labs and discussed the procedure including the risks, benefits and alternatives for the proposed anesthesia with the patient or authorized representative who has indicated his/her understanding and acceptance.       Plan Discussed with:   Anesthesia Plan Comments:         Anesthesia Quick Evaluation

## 2022-02-18 ENCOUNTER — Other Ambulatory Visit: Payer: Self-pay

## 2022-02-18 ENCOUNTER — Encounter (HOSPITAL_COMMUNITY)
Admission: AD | Disposition: A | Discharge status: Home / Self Care | Payer: Self-pay | Source: Home / Self Care | Attending: Obstetrics and Gynecology

## 2022-02-18 ENCOUNTER — Encounter (HOSPITAL_COMMUNITY): Payer: Self-pay | Admitting: Family Medicine

## 2022-02-18 DIAGNOSIS — O99214 Obesity complicating childbirth: Secondary | ICD-10-CM

## 2022-02-18 DIAGNOSIS — O34211 Maternal care for low transverse scar from previous cesarean delivery: Secondary | ICD-10-CM

## 2022-02-18 DIAGNOSIS — Z3A39 39 weeks gestation of pregnancy: Secondary | ICD-10-CM

## 2022-02-18 LAB — CBC
HCT: 32.4 % — ABNORMAL LOW (ref 36.0–46.0)
HCT: 31.8 % — ABNORMAL LOW (ref 36.0–46.0)
Hemoglobin: 10.8 g/dL — ABNORMAL LOW (ref 12.0–15.0)
Hemoglobin: 10.9 g/dL — ABNORMAL LOW (ref 12.0–15.0)
MCH: 30.1 pg (ref 26.0–34.0)
MCH: 30.6 pg (ref 26.0–34.0)
MCHC: 33.3 g/dL (ref 30.0–36.0)
MCHC: 34.3 g/dL (ref 30.0–36.0)
MCV: 90.3 fL (ref 80.0–100.0)
MCV: 89.3 fL (ref 80.0–100.0)
Platelets: 90 10*3/uL — ABNORMAL LOW (ref 150–400)
Platelets: 102 10*3/uL — ABNORMAL LOW (ref 150–400)
RBC: 3.59 MIL/uL — ABNORMAL LOW (ref 3.87–5.11)
RBC: 3.56 MIL/uL — ABNORMAL LOW (ref 3.87–5.11)
RDW: 13 % (ref 11.5–15.5)
RDW: 13.2 % (ref 11.5–15.5)
WBC: 9.8 10*3/uL (ref 4.0–10.5)
WBC: 25.6 10*3/uL — ABNORMAL HIGH (ref 4.0–10.5)
nRBC: 0 % (ref 0.0–0.2)
nRBC: 0 % (ref 0.0–0.2)

## 2022-02-18 SURGERY — Surgical Case
Anesthesia: Epidural

## 2022-02-18 MED ORDER — WITCH HAZEL-GLYCERIN EX PADS: 1.0000 | MEDICATED_PAD | CUTANEOUS | Status: DC | PRN

## 2022-02-18 MED ORDER — SODIUM CHLORIDE 0.9% FLUSH: 3.0000 mL | INTRAVENOUS | Status: DC | PRN

## 2022-02-18 MED ORDER — OXYCODONE HCL 5 MG PO TABS: 5.0000 mg | ORAL_TABLET | Freq: Once | ORAL | Status: DC | PRN

## 2022-02-18 MED ORDER — ZOLPIDEM TARTRATE 5 MG PO TABS: 5.0000 mg | ORAL_TABLET | Freq: Every evening | ORAL | Status: DC | PRN

## 2022-02-18 MED ORDER — DEXAMETHASONE SODIUM PHOSPHATE 4 MG/ML IJ SOLN
INTRAMUSCULAR | Status: DC | PRN
  Administered 2022-02-18: 10 mg via INTRAVENOUS

## 2022-02-18 MED ORDER — MIDAZOLAM HCL 2 MG/2ML IJ SOLN
INTRAMUSCULAR | Status: AC
  Filled 2022-02-18: qty 2

## 2022-02-18 MED ORDER — ONDANSETRON HCL 4 MG/2ML IJ SOLN: 4.0000 mg | Freq: Once | INTRAMUSCULAR | Status: DC | PRN

## 2022-02-18 MED ORDER — KETOROLAC TROMETHAMINE 30 MG/ML IJ SOLN: 30.0000 mg | Freq: Once | INTRAMUSCULAR | Status: DC | PRN

## 2022-02-18 MED ORDER — CEFAZOLIN SODIUM-DEXTROSE 2-4 GM/100ML-% IV SOLN
INTRAVENOUS | Status: AC
  Filled 2022-02-18: qty 100

## 2022-02-18 MED ORDER — LIDOCAINE-EPINEPHRINE (PF) 2 %-1:200000 IJ SOLN
INTRAMUSCULAR | Status: DC | PRN
  Administered 2022-02-18 (×2): 5 mL via EPIDURAL

## 2022-02-18 MED ORDER — DEXAMETHASONE SODIUM PHOSPHATE 10 MG/ML IJ SOLN
INTRAMUSCULAR | Status: AC
  Filled 2022-02-18: qty 1

## 2022-02-18 MED ORDER — MISOPROSTOL 200 MCG PO TABS
ORAL_TABLET | ORAL | Status: AC
  Filled 2022-02-18: qty 5

## 2022-02-18 MED ORDER — ONDANSETRON HCL 4 MG/2ML IJ SOLN
INTRAMUSCULAR | Status: DC | PRN
  Administered 2022-02-18: 4 mg via INTRAVENOUS

## 2022-02-18 MED ORDER — MIDAZOLAM HCL 2 MG/2ML IJ SOLN
INTRAMUSCULAR | Status: DC | PRN
  Administered 2022-02-18: 2 mg via INTRAVENOUS

## 2022-02-18 MED ORDER — TERBUTALINE SULFATE 1 MG/ML IJ SOLN
INTRAMUSCULAR | Status: AC
  Administered 2022-02-18: 1 mg
  Filled 2022-02-18: qty 1

## 2022-02-18 MED ORDER — ONDANSETRON HCL 4 MG/2ML IJ SOLN
INTRAMUSCULAR | Status: AC
  Filled 2022-02-18: qty 2

## 2022-02-18 MED ORDER — MEPERIDINE HCL 25 MG/ML IJ SOLN: 6.2500 mg | INTRAMUSCULAR | Status: DC | PRN

## 2022-02-18 MED ORDER — DIPHENHYDRAMINE HCL 25 MG PO CAPS
25.0000 mg | ORAL_CAPSULE | ORAL | Status: DC | PRN
Start: 2022-02-18 — End: 2022-02-18

## 2022-02-18 MED ORDER — ACETAMINOPHEN 500 MG PO TABS: 1000.0000 mg | ORAL_TABLET | Freq: Four times a day (QID) | ORAL | Status: DC

## 2022-02-18 MED ORDER — ACETAMINOPHEN 500 MG PO TABS
1000.0000 mg | ORAL_TABLET | Freq: Four times a day (QID) | ORAL | Status: DC
  Administered 2022-02-18 – 2022-02-21 (×12): 1000 mg via ORAL
  Filled 2022-02-18 (×13): qty 2

## 2022-02-18 MED ORDER — DIBUCAINE (PERIANAL) 1 % EX OINT: 1.0000 | TOPICAL_OINTMENT | CUTANEOUS | Status: DC | PRN

## 2022-02-18 MED ORDER — PHENYLEPHRINE HCL (PRESSORS) 10 MG/ML IV SOLN
INTRAVENOUS | Status: DC | PRN
  Administered 2022-02-18: 160 ug via INTRAVENOUS

## 2022-02-18 MED ORDER — TRANEXAMIC ACID-NACL 1000-0.7 MG/100ML-% IV SOLN
INTRAVENOUS | Status: AC
  Filled 2022-02-18: qty 100

## 2022-02-18 MED ORDER — OXYCODONE HCL 5 MG PO TABS
5.0000 mg | ORAL_TABLET | ORAL | Status: DC | PRN
  Administered 2022-02-18 – 2022-02-21 (×9): 5 mg via ORAL
  Filled 2022-02-18 (×9): qty 1

## 2022-02-18 MED ORDER — CARBOPROST TROMETHAMINE 250 MCG/ML IM SOLN
INTRAMUSCULAR | Status: DC | PRN
  Administered 2022-02-18: 250 ug via INTRAMUSCULAR

## 2022-02-18 MED ORDER — SIMETHICONE 80 MG PO CHEW
80.0000 mg | CHEWABLE_TABLET | Freq: Three times a day (TID) | ORAL | Status: DC
  Administered 2022-02-18 – 2022-02-21 (×9): 80 mg via ORAL
  Filled 2022-02-18 (×10): qty 1

## 2022-02-18 MED ORDER — KETOROLAC TROMETHAMINE 30 MG/ML IJ SOLN
30.0000 mg | Freq: Four times a day (QID) | INTRAMUSCULAR | Status: DC | PRN
Start: 2022-02-18 — End: 2022-02-18
  Administered 2022-02-18: 30 mg via INTRAVENOUS

## 2022-02-18 MED ORDER — OXYTOCIN-SODIUM CHLORIDE 30-0.9 UT/500ML-% IV SOLN
INTRAVENOUS | Status: DC | PRN
  Administered 2022-02-18 (×2): 30 [IU] via INTRAVENOUS

## 2022-02-18 MED ORDER — TRANEXAMIC ACID-NACL 1000-0.7 MG/100ML-% IV SOLN
INTRAVENOUS | Status: DC | PRN
  Administered 2022-02-18: 1000 mg via INTRAVENOUS

## 2022-02-18 MED ORDER — MISOPROSTOL 200 MCG PO TABS
800.0000 ug | ORAL_TABLET | Freq: Once | ORAL | Status: AC
Start: 2022-02-18 — End: 2022-02-18
  Administered 2022-02-18: 800 ug via RECTAL

## 2022-02-18 MED ORDER — ACETAMINOPHEN 10 MG/ML IV SOLN
INTRAVENOUS | Status: AC
  Filled 2022-02-18: qty 100

## 2022-02-18 MED ORDER — SCOPOLAMINE 1 MG/3DAYS TD PT72
1.0000 | MEDICATED_PATCH | Freq: Once | TRANSDERMAL | Status: DC
  Administered 2022-02-18: 1.5 mg via TRANSDERMAL
  Filled 2022-02-18: qty 1

## 2022-02-18 MED ORDER — DIPHENHYDRAMINE HCL 50 MG/ML IJ SOLN
12.5000 mg | INTRAMUSCULAR | Status: DC | PRN
  Administered 2022-02-18: 12.5 mg via INTRAVENOUS
  Filled 2022-02-18: qty 1

## 2022-02-18 MED ORDER — CARBOPROST TROMETHAMINE 250 MCG/ML IM SOLN
INTRAMUSCULAR | Status: AC
  Filled 2022-02-18: qty 1

## 2022-02-18 MED ORDER — KETOROLAC TROMETHAMINE 30 MG/ML IJ SOLN
INTRAMUSCULAR | Status: AC
  Filled 2022-02-18: qty 1

## 2022-02-18 MED ORDER — DEXMEDETOMIDINE (PRECEDEX) IN NS 20 MCG/5ML (4 MCG/ML) IV SYRINGE
PREFILLED_SYRINGE | INTRAVENOUS | Status: DC | PRN
  Administered 2022-02-18: 12 ug via INTRAVENOUS
  Administered 2022-02-18: 8 ug via INTRAVENOUS

## 2022-02-18 MED ORDER — OXYTOCIN-SODIUM CHLORIDE 30-0.9 UT/500ML-% IV SOLN: 2.5000 [IU]/h | INTRAVENOUS | Status: AC

## 2022-02-18 MED ORDER — COCONUT OIL OIL: 1.0000 | TOPICAL_OIL | Status: DC | PRN

## 2022-02-18 MED ORDER — CEFAZOLIN SODIUM-DEXTROSE 2-3 GM-%(50ML) IV SOLR
INTRAVENOUS | Status: DC | PRN
  Administered 2022-02-18: 2 g via INTRAVENOUS

## 2022-02-18 MED ORDER — HYDROMORPHONE HCL 1 MG/ML IJ SOLN: 0.2500 mg | INTRAMUSCULAR | Status: DC | PRN

## 2022-02-18 MED ORDER — METHYLERGONOVINE MALEATE 0.2 MG/ML IJ SOLN
INTRAMUSCULAR | Status: AC
  Filled 2022-02-18: qty 1

## 2022-02-18 MED ORDER — AMISULPRIDE (ANTIEMETIC) 5 MG/2ML IV SOLN: 10.0000 mg | Freq: Once | INTRAVENOUS | Status: DC | PRN

## 2022-02-18 MED ORDER — LACTATED RINGERS IV SOLN: INTRAVENOUS | Status: DC | PRN

## 2022-02-18 MED ORDER — ENOXAPARIN SODIUM 40 MG/0.4ML IJ SOSY
40.0000 mg | PREFILLED_SYRINGE | INTRAMUSCULAR | Status: DC
  Administered 2022-02-18 – 2022-02-20 (×3): 40 mg via SUBCUTANEOUS
  Filled 2022-02-18 (×3): qty 0.4

## 2022-02-18 MED ORDER — METHYLERGONOVINE MALEATE 0.2 MG/ML IJ SOLN
INTRAMUSCULAR | Status: DC | PRN
  Administered 2022-02-18: .2 mg via INTRAMUSCULAR

## 2022-02-18 MED ORDER — MORPHINE SULFATE (PF) 0.5 MG/ML IJ SOLN
INTRAMUSCULAR | Status: AC
  Filled 2022-02-18: qty 10

## 2022-02-18 MED ORDER — MORPHINE SULFATE (PF) 0.5 MG/ML IJ SOLN
INTRAMUSCULAR | Status: DC | PRN
  Administered 2022-02-18: 3 mg via EPIDURAL

## 2022-02-18 MED ORDER — OXYTOCIN 10 UNIT/ML IJ SOLN
INTRAMUSCULAR | Status: AC
  Filled 2022-02-18: qty 1

## 2022-02-18 MED ORDER — ACETAMINOPHEN 10 MG/ML IV SOLN
1000.0000 mg | Freq: Once | INTRAVENOUS | Status: AC
  Administered 2022-02-18: 1000 mg via INTRAVENOUS

## 2022-02-18 MED ORDER — OXYCODONE HCL 5 MG/5ML PO SOLN: 5.0000 mg | Freq: Once | ORAL | Status: DC | PRN

## 2022-02-18 MED ORDER — DIPHENHYDRAMINE HCL 25 MG PO CAPS
25.0000 mg | ORAL_CAPSULE | Freq: Four times a day (QID) | ORAL | Status: DC | PRN
  Administered 2022-02-18 – 2022-02-19 (×3): 25 mg via ORAL
  Filled 2022-02-18 (×3): qty 1

## 2022-02-18 MED ORDER — PRENATAL MULTIVITAMIN CH
1.0000 | ORAL_TABLET | Freq: Every day | ORAL | Status: DC
  Administered 2022-02-18 – 2022-02-21 (×4): 1 via ORAL
  Filled 2022-02-18 (×4): qty 1

## 2022-02-18 MED ORDER — RHO D IMMUNE GLOBULIN 1500 UNIT/2ML IJ SOSY
300.0000 ug | PREFILLED_SYRINGE | Freq: Once | INTRAMUSCULAR | Status: AC
Start: 2022-02-19 — End: 2022-02-19
  Administered 2022-02-19: 300 ug via INTRAVENOUS
  Filled 2022-02-18: qty 2

## 2022-02-18 MED ORDER — KETOROLAC TROMETHAMINE 30 MG/ML IJ SOLN: 30.0000 mg | Freq: Four times a day (QID) | INTRAMUSCULAR | Status: DC | PRN

## 2022-02-18 MED ORDER — NALOXONE HCL 4 MG/10ML IJ SOLN: 1.0000 ug/kg/h | INTRAVENOUS | Status: DC | PRN

## 2022-02-18 MED ORDER — TETANUS-DIPHTH-ACELL PERTUSSIS 5-2.5-18.5 LF-MCG/0.5 IM SUSY: 0.5000 mL | PREFILLED_SYRINGE | Freq: Once | INTRAMUSCULAR | Status: DC

## 2022-02-18 MED ORDER — ONDANSETRON HCL 4 MG/2ML IJ SOLN: 4.0000 mg | Freq: Three times a day (TID) | INTRAMUSCULAR | Status: DC | PRN

## 2022-02-18 MED ORDER — METHYLERGONOVINE MALEATE 0.2 MG PO TABS
0.2000 mg | ORAL_TABLET | Freq: Four times a day (QID) | ORAL | Status: AC
  Administered 2022-02-18 (×2): 0.2 mg via ORAL
  Filled 2022-02-18 (×2): qty 1

## 2022-02-18 MED ORDER — MENTHOL 3 MG MT LOZG
1.0000 | LOZENGE | OROMUCOSAL | Status: DC | PRN
Start: 2022-02-18 — End: 2022-02-21

## 2022-02-18 MED ORDER — SIMETHICONE 80 MG PO CHEW: 80.0000 mg | CHEWABLE_TABLET | ORAL | Status: DC | PRN

## 2022-02-18 MED ORDER — SENNOSIDES-DOCUSATE SODIUM 8.6-50 MG PO TABS
2.0000 | ORAL_TABLET | Freq: Every day | ORAL | Status: DC
  Administered 2022-02-19 – 2022-02-21 (×3): 2 via ORAL
  Filled 2022-02-18 (×3): qty 2

## 2022-02-18 MED ORDER — NALOXONE HCL 0.4 MG/ML IJ SOLN: 0.4000 mg | INTRAMUSCULAR | Status: DC | PRN

## 2022-02-18 SURGICAL SUPPLY — 36 items
BENZOIN TINCTURE PRP APPL 2/3 (GAUZE/BANDAGES/DRESSINGS) ×1 IMPLANT
CANISTER SUCT 3000ML PPV (MISCELLANEOUS) ×1 IMPLANT
CHLORAPREP W/TINT 26ML (MISCELLANEOUS) ×2 IMPLANT
CLAMP UMBILICAL CORD (MISCELLANEOUS) ×1 IMPLANT
CLOSURE STERI STRIP 1/2 X4 (GAUZE/BANDAGES/DRESSINGS) IMPLANT
DRSG OPSITE POSTOP 4X10 (GAUZE/BANDAGES/DRESSINGS) ×1 IMPLANT
ELECT REM PT RETURN 9FT ADLT (ELECTROSURGICAL) ×1
ELECTRODE REM PT RTRN 9FT ADLT (ELECTROSURGICAL) ×1 IMPLANT
EXTRACTOR VACUUM KIWI (MISCELLANEOUS) ×1 IMPLANT
GAUZE SPONGE 4X4 12PLY STRL LF (GAUZE/BANDAGES/DRESSINGS) IMPLANT
GLOVE BIOGEL PI IND STRL 7.0 (GLOVE) ×2 IMPLANT
GLOVE BIOGEL PI IND STRL 7.5 (GLOVE) ×1 IMPLANT
GLOVE BIOGEL PI INDICATOR 7.0 (GLOVE) ×2
GLOVE BIOGEL PI INDICATOR 7.5 (GLOVE) ×1
GLOVE SKINSENSE NS SZ7.0 (GLOVE) ×1
GLOVE SKINSENSE STRL SZ7.0 (GLOVE) ×1 IMPLANT
GOWN STRL REUS W/ TWL LRG LVL3 (GOWN DISPOSABLE) ×2 IMPLANT
GOWN STRL REUS W/ TWL XL LVL3 (GOWN DISPOSABLE) ×1 IMPLANT
GOWN STRL REUS W/TWL LRG LVL3 (GOWN DISPOSABLE) ×2
GOWN STRL REUS W/TWL XL LVL3 (GOWN DISPOSABLE) ×1
HEMOSTAT ARISTA ABSORB 3G PWDR (HEMOSTASIS) IMPLANT
NS IRRIG 1000ML POUR BTL (IV SOLUTION) ×1 IMPLANT
PACK C SECTION WH (CUSTOM PROCEDURE TRAY) ×1 IMPLANT
PAD ABD 7.5X8 STRL (GAUZE/BANDAGES/DRESSINGS) ×1 IMPLANT
PAD OB MATERNITY 4.3X12.25 (PERSONAL CARE ITEMS) ×1 IMPLANT
PAD PREP 24X48 CUFFED NSTRL (MISCELLANEOUS) ×1 IMPLANT
STRIP CLOSURE SKIN 1/2X4 (GAUZE/BANDAGES/DRESSINGS) ×1 IMPLANT
SUT MNCRL 0 VIOLET CTX 36 (SUTURE) ×2 IMPLANT
SUT MON AB 4-0 PS1 27 (SUTURE) ×1 IMPLANT
SUT MONOCRYL 0 CTX 36 (SUTURE) ×2
SUT PLAIN 2 0 XLH (SUTURE) ×1 IMPLANT
SUT VIC AB 0 CT1 36 (SUTURE) ×2 IMPLANT
SUT VIC AB 3-0 CT1 27 (SUTURE) ×1
SUT VIC AB 3-0 CT1 TAPERPNT 27 (SUTURE) ×1 IMPLANT
TOWEL OR 17X24 6PK STRL BLUE (TOWEL DISPOSABLE) ×2 IMPLANT
WATER STERILE IRR 1000ML POUR (IV SOLUTION) ×1 IMPLANT

## 2022-02-18 NOTE — Discharge Summary (Shared)
Postpartum Discharge Summary  Date of Service updated***     Patient Name: Danielle Sanchez DOB: [birth year] MRN: [id number]  Date of admission: 02/16/2022 Delivery date:02/18/2022  Delivering provider: Janyth Pupa  Date of discharge: 02/18/2022  Admitting diagnosis: Term pregnancy [Z34.90] Intrauterine pregnancy: [redacted]w[redacted]d    Secondary diagnosis:  Principal Problem:   Term pregnancy Prior C-section Morbid obesity  Additional problems: ***    Discharge diagnosis: Term Pregnancy Delivered                                              Post partum procedures:{Postpartum procedures:23558} Augmentation: AROM, Pitocin, and IP Foley Complications: {OB Labor/Delivery Complications:20784}  Hospital course: Induction of Labor With Cesarean Section   36y.o. yo [id number] admitted to the hospital 02/16/2022 for induction of labor. Patient was induced with Foley and Pit and AROM.  Pt had IUPC in place and progressed to complete.  Due to non-reassuring fetal tones, vacuum attempted and failed, she was taken for repeat C-section.  See operative note for further detailed.  Delivery was significant for uterine atony- Patient received Methergine, Cytotec, Hemabate and TXA.  Delivery details are as follows: Membrane Rupture Time/Date: 5:13 PM ,02/17/2022   Delivery Method:C-Section, Low Transverse  Patient had an uncomplicated postpartum course. *** She is ambulating, tolerating a regular diet, passing flatus, and urinating well.  Patient is discharged home in stable condition on 02/18/22.      Newborn Data: Birth date:02/18/2022  Birth time:12:22 AM  Gender:Female  Living status:Living  Apgars:5 ,7  Weight:3330 g                                Magnesium Sulfate received: {Mag received:30440022} BMZ received: {BMZ received:30440023} Rhophylac:{Rhophylac received:30440032} MMGBE:{EFE:07121975}-DaP:{Tdap:23962} Flu: {F{OIT:25498}ransfusion:{Transfusion received:30440034}  Physical exam   Vitals:   02/17/22 2300 02/17/22 2330 02/18/22 0007 02/18/22 0010  BP: 116/61 124/68    Pulse: 68 65    Resp: 16     Temp:      TempSrc:      SpO2:   (!) 79% 90%  Weight:      Height:       General: {Exam; general:21111117} Lochia: {Desc; appropriate/inappropriate:30686::"appropriate"} Uterine Fundus: {Desc; firm/soft:30687} Incision: {Exam; incision:21111123} DVT Evaluation: {Exam; dvt:2111122} Labs: Lab Results  Component Value Date   WBC 8.6 02/17/2022   HGB 11.9 (L) 02/17/2022   HCT 35.8 (L) 02/17/2022   MCV 89.9 02/17/2022   PLT 140 (L) 02/17/2022      Latest Ref Rng & Units 08/03/2021   10:22 AM  CMP  Glucose 70 - 99 mg/dL 77   BUN 6 - 20 mg/dL 7   Creatinine 0.57 - 1.00 mg/dL 0.56   Sodium 134 - 144 mmol/L 137   Potassium 3.5 - 5.2 mmol/L 3.9   Chloride 96 - 106 mmol/L 104   CO2 20 - 29 mmol/L 20   Calcium 8.7 - 10.2 mg/dL 8.8   Total Protein 6.0 - 8.5 g/dL 6.2   Total Bilirubin 0.0 - 1.2 mg/dL 0.3   Alkaline Phos 44 - 121 IU/L 81   AST 0 - 40 IU/L 19   ALT 0 - 32 IU/L 19    Edinburgh Score:     No data to display  After visit meds:  Allergies as of 02/18/2022       Reactions   Promethazine Other (See Comments)   Abdominal pain/dizziness/fatigue   Azithromycin Swelling     Med Rec must be completed prior to using this [facility]***        Discharge home in stable condition Infant Feeding: {Baby feeding:23562} Infant Disposition:{CHL IP OB HOME WITH GHWEXH:37169} Discharge instruction: per After Visit Summary and Postpartum booklet. Activity: Advance as tolerated. Pelvic rest for 6 weeks.  Diet: {OB CVEL:38101751} Future Appointments:No future appointments. Follow up Visit: Message sent on 02/18/2022 by Gifford Shave  Please schedule this patient for a In person postpartum visit in 6 weeks with the following provider: Any provider. Additional Postpartum F/U:Incision check 1 week  High risk pregnancy complicated by:   Previous cesarean section, prediabetes, history of gestational hypertension. Delivery mode:  C-Section, Low Transverse  Anticipated Birth Control:  Nexplanon   02/18/2022 Concepcion Living, MD

## 2022-02-18 NOTE — Progress Notes (Signed)
Late entry for ~ 0010.  Pt had rapidly progressed to complete; however, difficulty getting fetal tracing.  Bedside US showed slow HR of ~ 90s.  SVE: C/C/+2.  FSE was placed.  HR noted to be 100s.  Pt repositioned, Terb given.  Due to the rapid descent, attempt was made for vacuum delivery.  Foley was still in place.  IUPC was removed.  Kiwi placed, mom instructed to push, minimal descent noted.  FHT were still 100s.  At that time the decision was made to proceed with a repeat C-section due to non-reassuring fetal well being.  Verbal consent was obtained.  Myna Hidalgo, DO Attending Obstetrician & Gynecologist, [facility] for [employer], [facility]

## 2022-02-18 NOTE — Progress Notes (Signed)
Tech notified this RN that patient's BP was 96/47, 86/50, and reporting feeling "lightheaded". Dr. Shawnie Pons notified. Order received for STAT CBC. Will continue to monitor. Carmelina Dane, RN

## 2022-02-18 NOTE — Anesthesia Postprocedure Evaluation (Signed)
Anesthesia Post Note  Patient: Danielle Sanchez  Procedure(s) Performed: CESAREAN SECTION     Patient location during evaluation: PACU Anesthesia Type: Epidural Level of consciousness: awake and alert and oriented Pain management: pain level controlled Vital Signs Assessment: post-procedure vital signs reviewed and stable Respiratory status: spontaneous breathing, nonlabored ventilation and respiratory function stable Cardiovascular status: blood pressure returned to baseline and stable Postop Assessment: no headache, no backache, epidural receding and no apparent nausea or vomiting Anesthetic complications: no   No notable events documented.  Last Vitals:  Vitals:   02/18/22 0250 02/18/22 0255  BP:    Pulse: (!) 101 96  Resp: (!) 26 (!) 22  Temp:    SpO2: 97% 97%    Last Pain:  Vitals:   02/18/22 0242  TempSrc:   PainSc: 0-No pain   Pain Goal:                   Lannie Fields

## 2022-02-18 NOTE — Lactation Note (Signed)
This note was copied from a baby's chart.  NICU Lactation Consultation Note  Patient Name: Danielle Sanchez [id number] Date: 02/18/2022 Age:34 hours   Subjective Reason for consult: Initial assessment Birth parent bf last baby for 9 mo and plans to bf this infant. She denies hx breast surgery/trauma. I assisted with initiation of pumping and provided education.   Objective Infant data: Mother's Current Feeding Choice: Breast Milk  Infant feeding assessment Scale for Readiness: 1    Maternal data: [id number]  C-Section, Low Transverse  Does the patient have breastfeeding experience prior to this delivery?: Yes How long did the patient breastfeed?: 9 mo  Pumped volume: 25 mL   Pump: Personal (Spectra)  Assessment Infant:  Feeding Status: NPO   Maternal: Milk volume: Normal Normal breast symmetry   Intervention/Plan Interventions: DEBP; Education; Infant Engineer, technical sales education; [employer] brochure  Tools: Pump Pump Education: Setup, frequency, and cleaning; Milk Storage  Plan: Consult Status: NICU follow-up  NICU Follow-up type: New admission follow up; Verify absence of engorgement; Weekly NICU follow up; Verify onset of copious milk; Maternal D/C visit  Pump q3h and bring any EBM to NICU  Elder Negus 02/18/2022, 12:32 PM

## 2022-02-18 NOTE — Op Note (Signed)
PreOp Diagnosis: 1) Intrauterine pregnancy @ [redacted]w[redacted]d 2) Prior C-section, Failed TOLAC 3) Non-reassuring fetal heart tones 4) Morbid obesity  PostOp Diagnosis: same and Uterine atony  Procedure: Repeat C-section Surgeon: Dr. Myna Hidalgo Assistant: Dr. Derrel Nip Anesthesia: epidural Complications: none EBL: 718cc UOP: 700cc Fluids: 1500cc  Findings: Female infant from vertex presentation, delivered breech.  Normal uterus tubes and ovaries.  On patient's right some adhesions noted between anterior abdominal wall and uterine serosa.  Adhesions were not significant to cause difficulty with entry or delivery of baby.  PROCEDURE:   The patient was taken to Operating Room, and identified with the procedure verified as C-Section Delivery with Time Out. With induction of anesthesia, the patient was prepped and draped in the usual sterile fashion. A Pfannenstiel incision was made and carried down through the subcutaneous tissue to the fascia. The fascia was incised in the midline and extended transversely.   The rectus muscle was separated.  The peritoneum was identified and entered. Peritoneal incision was extended longitudinally. Alexis retractor was placed.  A low transverse uterine incision was made, upper arm/shoulder were visible at hysterotomy.  Due to the low position of baby,  baby was delivered breech.  Fetal leg was identified and brought to the hysterotomy followed by the second leg with delivery of the remainder of the fetus.  After the umbilical cord was clamped and cut cord blood was obtained for evaluation.   The placenta was removed intact and appeared normal. The uterine outline, tubes and ovaries appeared normal except for the small area of adhesion noted on the right side.  Uterine atony was noted, pt was given Methergine. The uterine incision was closed with running locked sutures of 0 Vicryl and a second layer of the same stitch was used in an imbricating fashion.   Atony  continued, pt was given rectal cytotec, Hemabate and TXA.  Excellent hemostasis was obtained, bogginess improved.  Alexis retractor was removed.  The fascia was then reapproximated with running sutures of 0 Vicryl. The subcutaneous tissue was reapproximated with 2-0 plain gut suture.  The skin was closed with 4-0 vicryl in a subcuticular fashion.  Instrument, sponge, and needle counts were correct prior the abdominal closure and at the conclusion of the case. The patient was taken to recovery in stable condition.   An experienced assistant was required given the standard of surgical care given the complexity of the case.  This assistant was needed for exposure, dissection, suctioning, retraction, instrument exchange,  assisting with delivery with administration of fundal pressure, and for overall help during the procedure.  Myna Hidalgo, DO Attending Obstetrician & Gynecologist, [facility] for [employer], [facility]

## 2022-02-18 NOTE — Transfer of Care (Signed)
Immediate Anesthesia Transfer of Care Note  Patient: Danielle Sanchez  Procedure(s) Performed: CESAREAN SECTION  Patient Location: PACU  Anesthesia Type:Epidural  Level of Consciousness: awake, alert  and oriented  Airway & Oxygen Therapy: Patient Spontanous Breathing  Post-op Assessment: Report given to RN and Post -op Vital signs reviewed and stable  Post vital signs: Reviewed and stable  Last Vitals:  Vitals Value Taken Time  BP 94/66 02/18/22 0133  Temp    Pulse 109 02/18/22 0140  Resp 22 02/18/22 0140  SpO2 99 % 02/18/22 0140  Vitals shown include unvalidated device data.  Last Pain:  Vitals:   02/17/22 2300  TempSrc:   PainSc: Asleep         Complications: No notable events documented.

## 2022-02-19 NOTE — Progress Notes (Signed)
CSW received consult for hx of Anxiety and Edinburgh Postnatal Depression Scale score of 10. CSW met with MOB to offer support and complete assessment. When CSW entered room, MOB was pumping in hospital bed. MOB's mother was present in room. CSW offered to return at another time; however, MOB reported it was okay for CSW to enter. MOB provided verbal consent to speak in front of her mother about anything. CSW introduced self and explained reason for consult. MOB was polite and remained engaged throughout encounter. MOB was observed with a bright and cheerful affect and was forthcoming.   CSW inquired if MOB is feeling well informed about infant "Wilson's" care while he has been in the NICU. MOB reports she felt like she needed to be in the NICU to receive updates and requested that she receive updates via her cell phone regarding infant's care moving forward. Update request was communicated to infant's RN.   CSW inquired how MOB has felt emotionally since giving birth. Mob reports she has felt "good considering all of it," referring to having a c-section and infant being in the NICU. CSW validated and normalized MOB's feelings. CSW reviewed NICU visitation policy with MOB. MOB denied having questions about NICU policies or infant's care at this time.   CSW inquired about MOB's mental health history. MOB reports she was diagnosed with anxiety over 1 year ago after she had a miscarriage. CSW expressed condolences. MOB shares she was evaluated by a psychologist prior to having gastric bypass surgery and was told that she has "functional anxiety at a 9." MOB agreed to try psychotropic medication to manage feelings of anxiety marked by feeling overwhelmed easily, "spiraling", and worrying. MOB reports she began taking Zoloft and her symptoms of anxiety decreased significantly. MOB shares she continues to take Zoloft, which helped her to feel "more accepting and calm." MOB reports she is doing well on medication and  plans to continue. MOB denies experiencing depression or significant anxiety during pregnancy, sharing that she has always experienced some anxiety but since taking Zoloft she has a new baseline marked by feeling calmer. CSW inquired if MOB experienced postpartum depression or anxiety symptoms after giving birth to her first son, Mallie Mussel who is now 35 years old. MOB shares she did experience both postpartum depression and anxiety after giving birth to [place], sharing she experienced feelings of sadness and crying. MOB shares she was able to bond with infant. MOB recalls feeling scared to sleep due to being afraid infant would stop breathing while he was asleep. MOB reports she attended therapy for 6 months postpartum to treat postpartum depression/anxiety, sharing it was helpful. MOB shares she still has her therapist's contact information if she experiences PPD with infant. MOB shares that she experienced stressors at home while postpartum with her first son, stating that her home life is in a better place now. MOB reports she feels well supported through her medical providers and declined additional mental health resources at this time. MOB denied current SI/HI/DV.  MOB reports she has all needed items for infant, including a car seat and mini crib. MOB has chosen [employer] [facility] as infant's pediatrician office. MOB shares she plans to have a baby shower through her church after she and infant have recovered. MOB identified her husband/FOB, mother, step-mother, father-in-law, and church community as positive supports. MOB declined additional resource support at this time.   CSW provided education regarding the baby blues period vs. perinatal mood disorders, discussed treatment and gave resources for mental  health follow up if concerns arise.  CSW recommends self-evaluation during the postpartum time period using the New Mom Checklist from Postpartum Progress and encouraged MOB to contact a medical  professional if symptoms are noted at any time.    CSW provided review of Sudden Infant Death Syndrome (SIDS) precautions.    CSW identifies no further need for intervention and no barriers to discharge at this time.  Signed,  Berniece Salines, MSW, [place], LCASA 02/19/2022 12:43 PM

## 2022-02-19 NOTE — Progress Notes (Addendum)
POSTPARTUM PROGRESS NOTE  POD #1  Subjective:  NATTIE LAZENBY is a 36 y.o. [id number] s/p rLTCS due to NRFHT at [redacted]w[redacted]d. Today she notes no acute complaints. She denies any problems with ambulating, voiding or po intake. Denies nausea or vomiting. She has passed flatus, no BM.  Pain is well contolled.  Lochia minimal and improving. Denies fever/chills/chest pain/SOB.  no HA, no blurry vision, no RUQ pain  Objective: Blood pressure (!) 91/53, pulse (!) 58, temperature 97.7 F (36.5 C), temperature source Oral, resp. rate 16, height 5\' 4"  (1.626 m), weight 104.5 kg, last menstrual period 05/17/2021, SpO2 100 %, unknown if currently breastfeeding.  Physical Exam:  General: alert, cooperative and no distress Chest: no respiratory distress Heart: regular rate and rhythm Abdomen: soft, nontender, +BS Uterine Fundus: firm, appropriately tender Incision: C/D/I DVT Evaluation: No calf swelling or tenderness Extremities: no edema Skin: warm, dry  Results for orders placed or performed during the hospital encounter of 02/16/22 (from the past 24 hour(s))  CBC     Status: Abnormal   Collection Time: 02/18/22 12:16 PM  Result Value Ref Range   WBC 25.6 (H) 4.0 - 10.5 K/uL   RBC 3.56 (L) 3.87 - 5.11 MIL/uL   Hemoglobin 10.9 (L) 12.0 - 15.0 g/dL   HCT 02/20/22 (L) 45.4 - 09.8 %   MCV 89.3 80.0 - 100.0 fL   MCH 30.6 26.0 - 34.0 pg   MCHC 34.3 30.0 - 36.0 g/dL   RDW 11.9 14.7 - 82.9 %   Platelets 102 (L) 150 - 400 K/uL   nRBC 0.0 0.0 - 0.2 %  Rh IG workup (includes ABO/Rh)     Status: None (Preliminary result)   Collection Time: 02/19/22  5:00 AM  Result Value Ref Range   Gestational Age(Wks) 39.4    Unit Number 02/21/22    Blood Component Type RHIG    Unit division 00    Status of Unit ISSUED    Transfusion Status      OK TO TRANSFUSE Performed at [facility], [address] [place], [place]     Assessment/Plan: NILA WINKER is a 36 y.o. [phone] s/p rLTCS at  [redacted]w[redacted]d POD#1   -meeting postop milestones appropriately -Uterine atony- lochia appropriate, VSS stable, pt asymptomatic, labs as above -encourage ambulation as tolerated -baby boy in NICU  Contraception: outpt Nexplanon Feeding: breast  Dispo: Continue routine postop care, likely plan for early discharge tomorrow   LOS: 3 days   [redacted]w[redacted]d, DO Faculty Attending, [facility] for [facility] 02/19/2022, 10:31 AM

## 2022-02-20 LAB — RH IG WORKUP (INCLUDES ABO/RH)
Fetal Screen: NEGATIVE
Gestational Age(Wks): 39.4
Unit division: 0

## 2022-02-20 NOTE — Progress Notes (Signed)
POSTPARTUM PROGRESS NOTE  POD #2  Subjective:  Danielle Sanchez is a 36 y.o. [id number] s/p rLTCS due to NRFHT at 102w4d.   Today she notes no acute complaints. She denies any problems with ambulating, voiding or po intake. Denies nausea or vomiting. She has passed flatus.  Pain is well contolled. Lochia minimal and improving. Denies fever/chills/chest pain/SOB.  no HA, no blurry vision, no RUQ pain  Objective: Blood pressure 111/64, pulse 66, temperature 98.1 F (36.7 C), temperature source Oral, resp. rate 15, height 5\' 4"  (1.626 m), weight 104.5 kg, last menstrual period 05/17/2021, SpO2 99 %, unknown if currently breastfeeding.  Physical Exam:  General: alert, cooperative and no distress Chest: no respiratory distress Heart: regular rate Abdomen: soft, nontender, +BS Uterine Fundus: firm, appropriately tender Incision: C/D/I DVT Evaluation: No calf swelling or tenderness Extremities: no edema Skin: warm, dry  No results found for this or any previous visit (from the past 24 hour(s)).   Assessment/Plan: Danielle Sanchez is a 36 y.o. 20 s/p rLTCS at [redacted]w[redacted]d POD#2  -meeting postop milestones appropriately -Uterine atony- lochia appropriate, VSS stable, pt asymptomatic -encourage ambulation as tolerated -baby boy in NICU doing well - Declines IBH referral but would appreciate 2 wk check in by RN through the office. I will send msg today to [facility].  - s/p Rhogam  Contraception: outpt Nexplanon Feeding: breast  Dispo: Continue routine postop care, plan for discharge tomorrow   LOS: 4 days   [facility], MD Faculty Attending, [facility] for [facility] 02/20/2022, 12:20 PM

## 2022-02-20 NOTE — Lactation Note (Signed)
This note was copied from a baby's chart. Lactation Consultation Note LC met with Ms. Trott in her [place] room at [phone]. Pt had questions/concerns about advancing bf'ing. [place] spoke with NICU RN and SLP and scheduled 0900 feeding time for challenging at breast.    Patient Name: Boy Lillionna Nabi JRPZP'[id number] Date: 02/20/2022 Reason for consult: Follow-up assessment Age:34 hours    Lactation Tools Discussed/Used Pumping frequency: q3 Pumped volume: 30 mL  Interventions Interventions: Skin to skin;Adjust position   Consult Status Consult Status: NICU follow-up Date: 02/20/22   Gwynne Edinger 02/20/2022, 10:19 AM

## 2022-02-20 NOTE — Lactation Note (Signed)
This note was copied from a baby's chart.  NICU Lactation Consultation Note  Patient Name: Boy Elliett Guarisco FSELT'R Date: 02/20/2022 Age:34 hours   Subjective Reason for consult: Follow-up assessment LC observed independent and effective breastfeeding. SLP present. Education about IDF provided.  Dyad will advance to ad lib breastfeeding today.   Objective Infant data: Mother's Current Feeding Choice: Breast Milk  Infant feeding assessment Scale for Readiness: 2 Scale for Quality: 1     Maternal data: [id number]  C-Section, Low Transverse  Pumping frequency: q3 Pumped volume: 30 mL  Pump: Personal (Spectra)  Assessment Infant: LATCH Score: 9   Maternal: Milk volume: Normal   Intervention/Plan Interventions: Skin to skin; Adjust position  Tools: Pump; 19F feeding tube / Syringe  Plan: Consult Status: NICU follow-up  NICU Follow-up type: Verify absence of engorgement; Assist with IDF-2 (Mother does not need to pre-pump before breastfeeding)  Ad lib breastfeeding Pump q3 p bf'ing  Elder Negus 02/20/2022, 10:17 AM

## 2022-02-21 LAB — SURGICAL PATHOLOGY

## 2022-02-21 MED ORDER — OXYCODONE HCL 5 MG PO TABS: 5.0000 mg | ORAL_TABLET | ORAL | 0 refills | Status: DC | PRN

## 2022-02-21 MED ORDER — ACETAMINOPHEN 500 MG PO TABS: 1000.0000 mg | ORAL_TABLET | Freq: Four times a day (QID) | ORAL | 0 refills | Status: DC

## 2022-02-21 MED ORDER — DIPHENHYDRAMINE HCL 25 MG PO CAPS: 25.0000 mg | ORAL_CAPSULE | Freq: Four times a day (QID) | ORAL | 0 refills | Status: DC | PRN

## 2022-02-21 NOTE — Progress Notes (Signed)
   02/21/22 1247  Departure Condition  Departure Condition Good  Mobility at [facility]  Patient/Caregiver Teaching Teach Back Method Used;Discharge instructions reviewed;Prescriptions reviewed;Follow-up care reviewed;Pain management discussed;Medications discussed;Patient/caregiver verbalized understanding;Educated about hypertension in pregnancy  Departure Mode With significant other  Was procedural sedation performed on this patient during this visit? No   Patient alert and oriented x4, VS and pain stable

## 2022-02-21 NOTE — Plan of Care (Signed)

## 2022-02-21 NOTE — Lactation Note (Signed)
This note was copied from a baby's chart. Lactation Consultation Note  Patient Name: Boy Nataliah Hatlestad NTIRW'E Date: 02/21/2022 Reason for consult: Follow-up assessment;NICU baby;Term Age:34 days  Lactation followed up with patient and baby Andrey Campanile. D/C pending today. I reviewed breastfeeding basics and discharge education including community breastfeeding resources. I recommended breastfeeding Andrey Campanile 8-12 times a day on demand and to offer both breasts in a feeding.  Patient asked about baby's 6% weight loss. I indicated this was WNL for day 3/4, but they would continue to monitor baby's weight via their pediatrician [facility]). If baby continues to lose weight in the next few days, I recommended pumping and supplementing after breastfeeding. Parent verbalized understanding.  I educated on what to expect with changes in milk volume. Patient states that she thinks her milk is beginning to transition today. I reviewed warning signs for feeding, and how to seek support.   All questions answered at this time.   Maternal Data Does the patient have breastfeeding experience prior to this delivery?: Yes How long did the patient breastfeed?: multiple years  Feeding Mother's Current Feeding Choice: Breast Milk   Lactation Tools Discussed/Used Pump Education: Setup, frequency, and cleaning Pumping frequency: as needed  Interventions Interventions: Breast feeding basics reviewed;Education;LC Services brochure  Discharge Discharge Education: Outpatient recommendation;Warning signs for feeding baby Pump: Personal  Consult Status Consult Status: Follow-up Date: 02/22/22 Follow-up type: In-patient    Walker Shadow 02/21/2022, 10:04 AM

## 2022-02-22 ENCOUNTER — Encounter: Payer: 59 | Admitting: Family Medicine

## 2022-02-22 ENCOUNTER — Telehealth (HOSPITAL_COMMUNITY): Payer: Self-pay | Admitting: *Deleted

## 2022-02-22 DIAGNOSIS — Z1331 Encounter for screening for depression: Secondary | ICD-10-CM

## 2022-02-22 NOTE — Telephone Encounter (Signed)
Inpatient EPDS of 10. SW consult completed. Ambulatory IBH referral made today. Dr. Ashok Pall notified via chart.  Duffy Rhody, RN 02-22-2022 at 1:47pm

## 2022-02-23 ENCOUNTER — Encounter: Payer: Self-pay | Admitting: Family Medicine

## 2022-02-27 ENCOUNTER — Ambulatory Visit (INDEPENDENT_AMBULATORY_CARE_PROVIDER_SITE_OTHER): Payer: 59

## 2022-02-27 VITALS — BP 137/83 | HR 67 | Wt 231.0 lb

## 2022-02-27 DIAGNOSIS — Z98891 History of uterine scar from previous surgery: Secondary | ICD-10-CM

## 2022-02-27 NOTE — Progress Notes (Signed)
Subjective:     Danielle Sanchez is a 36 y.o. female who presents to the clinic 1 weeks status post low uterine, transverse cesarean section. Pt reports incision is healing well.      Objective:    BP 137/83   Pulse 67   Wt 231 lb (104.8 kg)   LMP 05/17/2021 (Exact Date)   Breastfeeding Yes   BMI 39.65 kg/m  General:  alert, well appearing, in no apparent distress  Incision:   healing well, no drainage, no erythema, no hernia, no seroma, no swelling,incision well approximated     Assessment:    Doing well postoperatively.   Plan:    1. Continue any current medications. 2. Wound care discussed. 3. Follow up: at PP .    Danielle Sanchez  [id number] here for postpartum depression screen. Pt is currently 1 weeks postpartum. Pt reports doing well.   Edinburgh Postnatal Depression Scale - 02/27/22 1059       Edinburgh Postnatal Depression Scale:  In the Past 7 Days   I have been able to laugh and see the funny side of things. 0    I have looked forward with enjoyment to things. 0    I have blamed myself unnecessarily when things went wrong. 1    I have been anxious or worried for no good reason. 1    I have felt scared or panicky for no good reason. 1    Things have been getting on top of me. 1    I have been so unhappy that I have had difficulty sleeping. 1    I have felt sad or miserable. 0    I have been so unhappy that I have been crying. 1    The thought of harming myself has occurred to me. 0    Edinburgh Postnatal Depression Scale Total 6               Discussed with provider results of Edinburgh .  Pt to follow up PP.

## 2022-03-01 ENCOUNTER — Telehealth: Payer: 59 | Admitting: Family Medicine

## 2022-03-01 DIAGNOSIS — H10021 Other mucopurulent conjunctivitis, right eye: Secondary | ICD-10-CM

## 2022-03-01 MED ORDER — POLYMYXIN B-TRIMETHOPRIM 10000-0.1 UNIT/ML-% OP SOLN: 1.0000 [drp] | Freq: Four times a day (QID) | OPHTHALMIC | 1 refills | Status: DC

## 2022-03-01 NOTE — Patient Instructions (Signed)
Danielle Sanchez, thank you for joining Freddy Finner, NP for today's virtual visit.  While this provider is not your primary care provider (PCP), if your PCP is located in our provider database this encounter information will be shared with them immediately following your visit.  Consent: (Patient) Danielle Sanchez provided verbal consent for this virtual visit at the beginning of the encounter.  Current Medications:  Current Outpatient Medications:    trimethoprim-polymyxin b (POLYTRIM) ophthalmic solution, Place 1-2 drops into the right eye in the morning, at noon, in the evening, and at bedtime. For 5 days, Disp: 10 mL, Rfl: 1   acetaminophen (TYLENOL) 500 MG tablet, Take 2 tablets (1,000 mg total) by mouth every 6 (six) hours., Disp: 30 tablet, Rfl: 0   Calcium Citrate-Vitamin D (CITRACAL + D PO), , Disp: , Rfl:    diphenhydrAMINE (BENADRYL) 25 mg capsule, Take 1 capsule (25 mg total) by mouth every 6 (six) hours as needed for itching., Disp: 30 capsule, Rfl: 0   Multiple Minerals-Vitamins (CAL MAG ZINC +D3 PO), Take 2 tablets by mouth in the morning, at noon, and at bedtime., Disp: , Rfl:    NASCOBAL 500 MCG/0.1ML SOLN, Place 1 spray into both nostrils every Saturday., Disp: , Rfl:    Omega-3 1000 MG CAPS, , Disp: , Rfl:    oxyCODONE (OXY IR/ROXICODONE) 5 MG immediate release tablet, Take 1-2 tablets (5-10 mg total) by mouth every 4 (four) hours as needed for moderate pain., Disp: 20 tablet, Rfl: 0   sertraline (ZOLOFT) 100 MG tablet, Take 100 mg by mouth daily., Disp: , Rfl:    Medications ordered in this encounter:  Meds ordered this encounter  Medications   trimethoprim-polymyxin b (POLYTRIM) ophthalmic solution    Sig: Place 1-2 drops into the right eye in the morning, at noon, in the evening, and at bedtime. For 5 days    Dispense:  10 mL    Refill:  1    Order Specific Question:   Supervising Provider    Answer:   Hyacinth Meeker, BRIAN [[id number]]     *If you need refills on other  medications prior to your next appointment, please contact your pharmacy*  Follow-Up: Call back or seek an in-person evaluation if the symptoms worsen or if the condition fails to improve as anticipated.  Other Instructions Bacterial Conjunctivitis, Adult Bacterial conjunctivitis is an infection of your conjunctiva. This is the clear membrane that covers the white part of your eye and the inner part of your eyelid. This infection can make your eye: Red or pink. Itchy or irritated. This condition spreads easily from person to person (is contagious) and from one eye to the other eye. What are the causes? This condition is caused by germs (bacteria). You may get the infection if you come into close contact with: A person who has the infection. Items that have germs on them (are contaminated), such as face towels, contact lens solution, or eye makeup. What increases the risk? You are more likely to get this condition if: You have contact with people who have the infection. You wear contact lenses. You have a sinus infection. You have had a recent eye injury or surgery. You have a weak body defense system (immune system). You have dry eyes. What are the signs or symptoms?  Thick, yellowish discharge from the eye. Tearing or watery eyes. Itchy eyes. Burning feeling in your eyes. Eye redness. Swollen eyelids. Blurred vision. How is this treated?  Antibiotic eye drops  or ointment. Antibiotic medicine taken by mouth. This is used for infections that do not get better with drops or ointment or that last more than 10 days. Cool, wet cloths placed on the eyes. Artificial tears used 2-6 times a day. Follow these instructions at home: Medicines Take or apply your antibiotic medicine as told by your doctor. Do not stop using it even if you start to feel better. Take or apply over-the-counter and prescription medicines only as told by your doctor. Do not touch your eyelid with the eye-drop  bottle or the ointment tube. Managing discomfort Wipe any fluid from your eye with a warm, wet washcloth or a cotton ball. Place a clean, cool, wet cloth on your eye. Do this for 10-20 minutes, 3-4 times a day. General instructions Do not wear contacts until the infection is gone. Wear glasses until your doctor says it is okay to wear contacts again. Do not wear eye makeup until the infection is gone. Throw away old eye makeup. Change or wash your pillowcase every day. Do not share towels or washcloths. Wash your hands often with soap and water for at least 20 seconds and especially before touching your face or eyes. Use paper towels to dry your hands. Do not touch or rub your eyes. Do not drive or use heavy machinery if your vision is blurred. Contact a doctor if: You have a fever. You do not get better after 10 days. Get help right away if: You have a fever and your symptoms get worse all of a sudden. You have very bad pain when you move your eye. Your face: Hurts. Is red. Is swollen. You have sudden loss of vision. Summary Bacterial conjunctivitis is an infection of your conjunctiva. This infection spreads easily from person to person. Wash your hands often with soap and water for at least 20 seconds and especially before touching your face or eyes. Use paper towels to dry your hands. Take or apply your antibiotic medicine as told by your doctor. Contact a doctor if you have a fever or you do not get better after 10 days. This information is not intended to replace advice given to you by your health care provider. Make sure you discuss any questions you have with your health care provider. Document Revised: 09/29/2020 Document Reviewed: 09/29/2020 Elsevier Patient Education  2023 Elsevier Inc.    If you have been instructed to have an in-person evaluation today at a local Urgent Care facility, please use the link below. It will take you to a list of all of our available [facility], including address, phone number and hours of operation. Please do not delay care.  [facility]  If you or a family member do not have a primary care provider, use the link below to schedule a visit and establish care. When you choose a [facility] primary care physician or advanced practice provider, you gain a long-term partner in health. Find a Primary Care Provider  Learn more about [facility]'s in-office and virtual care options: [facility] - Get Care Now

## 2022-03-01 NOTE — Progress Notes (Signed)
Virtual Visit Consent   Danielle Sanchez, you are scheduled for a virtual visit with a [facility] provider today. Just as with appointments in the office, your consent must be obtained to participate. Your consent will be active for this visit and any virtual visit you may have with one of our providers in the next 365 days. If you have a MyChart account, a copy of this consent can be sent to you electronically.  As this is a virtual visit, video technology does not allow for your provider to perform a traditional examination. This may limit your provider's ability to fully assess your condition. If your provider identifies any concerns that need to be evaluated in person or the need to arrange testing (such as labs, EKG, etc.), we will make arrangements to do so. Although advances in technology are sophisticated, we cannot ensure that it will always work on either your end or our end. If the connection with a video visit is poor, the visit may have to be switched to a telephone visit. With either a video or telephone visit, we are not always able to ensure that we have a secure connection.  By engaging in this virtual visit, you consent to the provision of healthcare and authorize for your insurance to be billed (if applicable) for the services provided during this visit. Depending on your insurance coverage, you may receive a charge related to this service.  I need to obtain your verbal consent now. Are you willing to proceed with your visit today? Danielle Sanchez has provided verbal consent on 03/01/2022 for a virtual visit (video or telephone). Freddy Finner, NP  Date: 03/01/2022 9:02 AM  Virtual Visit via Video Note   I, Freddy Finner, connected with  Danielle Sanchez  ([id number], 04-Jun-1986) on 03/01/22 at  9:15 AM EDT by a video-enabled telemedicine application and verified that I am speaking with the correct person using two identifiers.  Location: Patient: Virtual Visit Location Patient:  Home Provider: Virtual Visit Location Provider: Home Office   I discussed the limitations of evaluation and management by telemedicine and the availability of in person appointments. The patient expressed understanding and agreed to proceed.    History of Present Illness: Danielle Sanchez is a 36 y.o. who identifies as a female who was assigned female at birth, and is being seen today for pink eye.  HPI: Conjunctivitis  The current episode started today. The onset was sudden. The problem occurs frequently. The problem has been gradually worsening. The problem is mild. Nothing relieves the symptoms. The symptoms are aggravated by light. Associated symptoms include eye itching, photophobia, eye discharge and eye redness. Pertinent negatives include no fever, no decreased vision, no double vision, no congestion, no ear discharge, no ear pain, no headaches, no hearing loss, no mouth sores, no rhinorrhea, no sore throat, no stridor, no swollen glands and no eye pain. The eye pain is mild. The right eye is affected. The eye pain is not associated with movement. The eyelid exhibits redness and swelling. There were sick contacts at home.    Problems:  Patient Active Problem List   Diagnosis Date Noted   Term pregnancy 02/17/2022   BMI 38.0-38.9,adult 12/14/2021   Generalized anxiety disorder 11/30/2021   Rh negative state in antepartum period 08/31/2021   Gestational thrombocytopenia (HCC) 08/04/2021   Previous cesarean delivery affecting pregnancy, antepartum 08/03/2021   Supervision of other normal pregnancy, antepartum 07/11/2021   S/P biliopancreatic diversion with duodenal switch  11/17/2020   Migraine without status migrainosus, not intractable 07/22/2020   Prediabetes 06/14/2018   History of gestational hypertension 10/25/2017   Maternal obesity, antepartum 05/28/2017   Elevated prolactin level 05/28/2017    Allergies:  Allergies  Allergen Reactions   Promethazine Other (See Comments)     Abdominal pain/dizziness/fatigue   Azithromycin Swelling   Medications:  Current Outpatient Medications:    acetaminophen (TYLENOL) 500 MG tablet, Take 2 tablets (1,000 mg total) by mouth every 6 (six) hours., Disp: 30 tablet, Rfl: 0   Calcium Citrate-Vitamin D (CITRACAL + D PO), , Disp: , Rfl:    diphenhydrAMINE (BENADRYL) 25 mg capsule, Take 1 capsule (25 mg total) by mouth every 6 (six) hours as needed for itching., Disp: 30 capsule, Rfl: 0   Multiple Minerals-Vitamins (CAL MAG ZINC +D3 PO), Take 2 tablets by mouth in the morning, at noon, and at bedtime., Disp: , Rfl:    NASCOBAL 500 MCG/0.1ML SOLN, Place 1 spray into both nostrils every Saturday., Disp: , Rfl:    Omega-3 1000 MG CAPS, , Disp: , Rfl:    oxyCODONE (OXY IR/ROXICODONE) 5 MG immediate release tablet, Take 1-2 tablets (5-10 mg total) by mouth every 4 (four) hours as needed for moderate pain., Disp: 20 tablet, Rfl: 0   sertraline (ZOLOFT) 100 MG tablet, Take 100 mg by mouth daily., Disp: , Rfl:   Observations/Objective: Patient is well-developed, well-nourished in no acute distress.  Resting comfortably  at home.  Head is normocephalic, atraumatic.  No labored breathing.  Speech is clear and coherent with logical content.  Patient is alert and oriented at baseline.  Redness and mild swelling of right eye lid and eye  Assessment and Plan: 1. Pink eye disease of right eye  - trimethoprim-polymyxin b (POLYTRIM) ophthalmic solution; Place 1-2 drops into the right eye in the morning, at noon, in the evening, and at bedtime. For 5 days  Dispense: 10 mL; Refill: 1  -is breastfeeding  -warm compresses, advised to not rub eye, use drops -strict in person eval if vision changes, swelling or pressure worsen, or unable to open eye lid.   Reviewed side effects, risks and benefits of medication.    Patient acknowledged agreement and understanding of the plan.   Past Medical, Surgical, Social History, Allergies, and Medications  have been Reviewed.    Follow Up Instructions: I discussed the assessment and treatment plan with the patient. The patient was provided an opportunity to ask questions and all were answered. The patient agreed with the plan and demonstrated an understanding of the instructions.  A copy of instructions were sent to the patient via MyChart unless otherwise noted below.     The patient was advised to call back or seek an in-person evaluation if the symptoms worsen or if the condition fails to improve as anticipated.  Time:  I spent 10 minutes with the patient via telehealth technology discussing the above problems/concerns.    Freddy Finner, NP

## 2022-04-05 ENCOUNTER — Ambulatory Visit: Payer: 59 | Admitting: Family Medicine

## 2022-04-12 ENCOUNTER — Encounter: Payer: Self-pay | Admitting: Obstetrics & Gynecology

## 2022-04-13 ENCOUNTER — Ambulatory Visit (INDEPENDENT_AMBULATORY_CARE_PROVIDER_SITE_OTHER): Payer: 59 | Admitting: Obstetrics & Gynecology

## 2022-04-13 ENCOUNTER — Encounter: Payer: Self-pay | Admitting: Obstetrics & Gynecology

## 2022-04-13 DIAGNOSIS — Z30018 Encounter for initial prescription of other contraceptives: Secondary | ICD-10-CM

## 2022-04-13 DIAGNOSIS — Z23 Encounter for immunization: Secondary | ICD-10-CM | POA: Diagnosis not present

## 2022-04-13 LAB — POCT URINE PREGNANCY: Preg Test, Ur: NEGATIVE

## 2022-04-13 MED ORDER — PHEXXI 1.8-1-0.4 % VA GEL: VAGINAL | 11 refills | Status: DC

## 2022-04-13 NOTE — Progress Notes (Signed)
[facility] Partum Visit Note  Danielle Sanchez is a 36 y.o. G14P2012 female who presents for a postpartum visit. She is 6 weeks postpartum following a repeat cesarean section.  I have fully reviewed the prenatal and intrapartum course. The delivery was at 29 gestational weeks.  Anesthesia: epidural. Postpartum course has been unremarkable. Baby is doing well. Baby is feeding by breast. Bleeding no bleeding. Bowel function is normal. Bladder function is normal. Patient is sexually active, desires pregnancy test. Contraception method is none for now. Postpartum depression screening: negative.   The pregnancy intention screening data noted above was reviewed. Potential methods of contraception were discussed. The patient elected to proceed with non-hormonal methods.   Edinburgh Postnatal Depression Scale - 04/13/22 1610       Edinburgh Postnatal Depression Scale:  In the Past 7 Days   I have been able to laugh and see the funny side of things. 0    I have looked forward with enjoyment to things. 0    I have blamed myself unnecessarily when things went wrong. 0    I have been anxious or worried for no good reason. 0    I have felt scared or panicky for no good reason. 0    Things have been getting on top of me. 0    I have been so unhappy that I have had difficulty sleeping. 0    I have felt sad or miserable. 0    I have been so unhappy that I have been crying. 0    The thought of harming myself has occurred to me. 0    Edinburgh Postnatal Depression Scale Total 0             Health Maintenance Due  Topic Date Due   COVID-19 Vaccine (4 - Pfizer series) 12/28/2020   INFLUENZA VACCINE  01/31/2022    The following portions of the patient's history were reviewed and updated as appropriate: allergies, current medications, past family history, past medical history, past social history, past surgical history, and problem list.  Review of Systems Pertinent items noted in HPI and remainder of  comprehensive ROS otherwise negative.  Objective:  BP 139/83   Pulse 79   Wt 206 lb (93.4 kg)   Breastfeeding Yes   BMI 35.36 kg/m    General:  alert and no distress   Breasts:  normal, chaperone present  Lungs: clear to auscultation bilaterally  Heart:  regular rate and rhythm  Abdomen: soft, non-tender; bowel sounds normal; no masses,  no organomegaly   Wound well approximated incision, no erythema, no induration, no drainage  GU exam:  not indicated       Assessment:   Postpartum care following cesarean delivery  Plan:   Essential components of care per ACOG recommendations:  1.  Mood and well being: Patient with negative depression screening today. Reviewed local resources for support.  - Patient tobacco use? No.   - hx of drug use? No.    2. Infant care and feeding:  -Patient currently breastmilk feeding? Yes. Reviewed importance of draining breast regularly to support lactation.  -Social determinants of health (SDOH) reviewed in EPIC. No concerns  3. Sexuality, contraception and birth spacing - Patient does not want a pregnancy in the next year.  Desired family size is a lot of children.  -UPT today is negative - Reviewed reproductive life planning. Reviewed contraceptive methods based on pt preferences and effectiveness.  Patient desired FAM or LAM and Female  Condom today.  Also counseled about Phexxi, this was prescribed for patient. - Discussed birth spacing of 18 months especially given her recent cesarean delivery.  4. Sleep and fatigue -Encouraged family/partner/community support of 4 hrs of uninterrupted sleep to help with mood and fatigue  5. Physical Recovery  - Discussed patients delivery and complications. She describes her labor as mixed. - Patient had a C-section for non reassuring fetal heart rate tracing.  Wound healing reviewed. Patient expressed understanding - Patient has urinary incontinence? No. - Patient is safe to resume physical and sexual  activity  6.  Health Maintenance - HM due items addressed Yes - Last pap smear  Diagnosis  Date Value Ref Range Status  07/30/2020   Final   - Negative for intraepithelial lesion or malignancy (NILM)   Pap smear done at today's visit.  Flu vaccine given.     Verita Schneiders, MD [facility] [place]

## 2022-04-24 ENCOUNTER — Encounter: Payer: Self-pay | Admitting: *Deleted

## 2022-05-26 ENCOUNTER — Other Ambulatory Visit: Payer: Self-pay | Admitting: Family Medicine

## 2022-05-31 ENCOUNTER — Other Ambulatory Visit: Payer: Self-pay | Admitting: *Deleted

## 2022-05-31 ENCOUNTER — Other Ambulatory Visit: Payer: Self-pay | Admitting: Family Medicine

## 2022-05-31 DIAGNOSIS — N8189 Other female genital prolapse: Secondary | ICD-10-CM

## 2022-05-31 MED ORDER — SERTRALINE HCL 50 MG PO TABS
50.0000 mg | ORAL_TABLET | Freq: Every day | ORAL | 3 refills | Status: DC
Start: 2022-05-31 — End: 2022-12-19

## 2022-06-02 ENCOUNTER — Encounter: Payer: Self-pay | Admitting: Physical Therapy

## 2022-06-02 ENCOUNTER — Ambulatory Visit: Payer: 59 | Attending: Family Medicine | Admitting: Physical Therapy

## 2022-06-02 ENCOUNTER — Other Ambulatory Visit: Payer: Self-pay

## 2022-06-02 DIAGNOSIS — R293 Abnormal posture: Secondary | ICD-10-CM | POA: Insufficient documentation

## 2022-06-02 DIAGNOSIS — M62838 Other muscle spasm: Secondary | ICD-10-CM | POA: Diagnosis not present

## 2022-06-02 DIAGNOSIS — R278 Other lack of coordination: Secondary | ICD-10-CM | POA: Diagnosis not present

## 2022-06-02 DIAGNOSIS — N8189 Other female genital prolapse: Secondary | ICD-10-CM | POA: Diagnosis present

## 2022-06-02 DIAGNOSIS — M6281 Muscle weakness (generalized): Secondary | ICD-10-CM | POA: Diagnosis not present

## 2022-06-02 NOTE — Therapy (Signed)
OUTPATIENT PHYSICAL THERAPY FEMALE PELVIC EVALUATION   Patient Name: Danielle Sanchez MRN: [id number] DOB:1985/11/02, 36 y.o., female Today's Date: 06/02/2022  END OF SESSION:  PT End of Session - 06/02/22 1023     Visit Number 1    Date for PT Re-Evaluation 08/25/22    Authorization Type [employer]    PT Start Time 1016    PT Stop Time 1100    PT Time Calculation (min) 44 min    Activity Tolerance Patient tolerated treatment well    Behavior During Therapy WFL for tasks assessed/performed             Past Medical History:  Diagnosis Date   Allergy    Anxiety    Gestational thrombocytopenia (HCC)    Headache    History of bunionectomy    PONV (postoperative nausea and vomiting)    Scopolamine patch not helpful. TIVA with propofol on 05/04/21 (with decadron/zofran) - no PONV   Pregnancy induced hypertension    Spontaneous miscarriage 02/03/2021   Past Surgical History:  Procedure Laterality Date   ANTERIOR CRUCIATE LIGAMENT REPAIR Right    bone spur Left    BUNIONECTOMY Right 05/04/2021   Procedure: BUNIONECTOMY- LAPIDUS-TYPE;  Surgeon: Gwyneth Revels, DPM;  Location: [facility];  Service: Podiatry;  Laterality: Right;   CESAREAN SECTION N/A 11/01/2017   Procedure: CESAREAN SECTION;  Surgeon: Ward, Elenora Fender, MD;  Location: [facility];  Service: Obstetrics;  Laterality: N/A;   CESAREAN SECTION N/A 02/18/2022   Procedure: CESAREAN SECTION;  Surgeon: Myna Hidalgo, DO;  Location: [facility];  Service: Obstetrics;  Laterality: N/A;   DILATION AND CURETTAGE OF UTERUS N/A 02/03/2021   Procedure: DILATATION AND CURETTAGE;  Surgeon: Christeen Douglas, MD;  Location: [facility];  Service: Gynecology;  Laterality: N/A;   LAPAROSCOPIC GASTRIC RESTRICTIVE DUODENAL PROCEDURE (DUODENAL SWITCH)  10/2020   MENISCUS REPAIR Right    WISDOM TOOTH EXTRACTION  2017   Patient Active Problem List   Diagnosis Date Noted   Term pregnancy 02/17/2022   BMI 38.0-38.9,adult 12/14/2021    Generalized anxiety disorder 11/30/2021   Rh negative state in antepartum period 08/31/2021   Gestational thrombocytopenia (HCC) 08/04/2021   Previous cesarean delivery affecting pregnancy, antepartum 08/03/2021   Supervision of other normal pregnancy, antepartum 07/11/2021   S/P biliopancreatic diversion with duodenal switch 11/17/2020   Migraine without status migrainosus, not intractable 07/22/2020   Prediabetes 06/14/2018   History of gestational hypertension 10/25/2017   Maternal obesity, antepartum 05/28/2017   Elevated prolactin level 05/28/2017    PCP: Trey Sailors, PA-C  REFERRING PROVIDER: Reva Bores, MD  REFERRING DIAG:  N81.89 (ICD-10-CM) - Weakness of pelvic floor      THERAPY DIAG:  Other lack of coordination  Muscle weakness (generalized)  Other muscle spasm  Abnormal posture  Rationale for Evaluation and Treatment: Rehabilitation  ONSET DATE: August 2023  SUBJECTIVE:  SUBJECTIVE STATEMENT: When she is sitting down she feels like she is having some pressure in her vaginal area. She gave birth recently on August 19. The symptoms started during pregnancy and has gotten worse after giving birth. She had a duodenal switch on 10/2020. Fluid intake: Yes: on average 80 oz due to breast feeding     PAIN:  Are you having pain? Yes NPRS scale: 2/10 more uncomfortable  Pain location: Vaginal and c-section scar   Pain type: aching Pain description: constant   Relieving factors: Using the restroom   PRECAUTIONS: None  WEIGHT BEARING RESTRICTIONS: No  FALLS:  Has patient fallen in last 6 months? No  LIVING ENVIRONMENT: Lives with: lives with their family Lives in: House/apartment  OCCUPATION: Work from home   PLOF: Independent  PATIENT GOALS: Remove the  uncomfortable ness   PERTINENT HISTORY:  Anxiety, spontaneous miscarriage, 2 cesarean, duodenal switch surgery Sexual abuse: No  BOWEL MOVEMENT: Pain with bowel movement: Yes depends and have some blood in bowel movement  Type of bowel movement:Strain Yes Fully empty rectum: No Leakage: No Pads: No Fiber supplement: Yes: Bloom supplement   URINATION: Pain with urination: No Fully empty bladder: No Stream: Weak than normal  Urgency: Yes:   Frequency: every 1.5-2 hours  Leakage: Sneezing Pads: No  INTERCOURSE: Pain with intercourse: Initial Penetration Ability to have vaginal penetration:  Yes:   Climax:  No  Marinoff Scale: 1/3  PREGNANCY: C-section deliveries 2 Currently pregnant No  PROLAPSE: None   OBJECTIVE:   DIAGNOSTIC FINDINGS:    PATIENT SURVEYS:    PFIQ-7   COGNITION: Overall cognitive status: Within functional limits for tasks assessed     SENSATION: Light touch: Appears intact Proprioception: Appears intact  MUSCLE LENGTH: Hamstrings: Right 80 deg; Left 75 deg  LUMBAR SPECIAL TESTS:  Active Straight leg raise test: Positive for abdominal weakness  and Single leg stance test: Positive bilateral hip drop   FUNCTIONAL TESTS:    GAIT: Comments: WFL  POSTURE: increased lumbar lordosis  PELVIC ALIGNMENT: Anteriorly directed pelvis   LUMBARAROM/PROM:  A/PROM A/PROM  eval  Flexion 25%  Extension   Right lateral flexion   Left lateral flexion   Right rotation   Left rotation    (Blank rows = not tested)  LOWER EXTREMITY ROM:  Passive ROM Right eval Left eval  Hip flexion 75% 75%  Hip extension    Hip abduction    Hip adduction    Hip internal rotation 50% 50%  Hip external rotation wfl wfl  Knee flexion    Knee extension    Ankle dorsiflexion    Ankle plantarflexion    Ankle inversion    Ankle eversion     (Blank rows = not tested)  LOWER EXTREMITY MMT:  MMT Right eval Left eval  Hip flexion 4-/5 4+/5  Hip  extension    Hip abduction 5/5 5/5  Hip adduction 5/5 5/5  Hip internal rotation 4/5 4/5  Hip external rotation 5/5 5/5  Knee flexion    Knee extension    Ankle dorsiflexion    Ankle plantarflexion    Ankle inversion    Ankle eversion     PALPATION:   General: C-section scar healed as expected                 External Perineal Exam Adductor Insertion tightness  Internal Pelvic Floor: Tenderness to palpation at Iliococcygeus and obturator internus L>R  Patient confirms identification and approves PT to assess internal pelvic floor and treatment Yes No emotional/communication barriers or cognitive limitation. Patient is motivated to learn. Patient understands and agrees with treatment goals and plan. PT explains patient will be examined in standing, sitting, and lying down to see how their muscles and joints work. When they are ready, they will be asked to remove their underwear so PT can examine their perineum. The patient is also given the option of providing their own chaperone as one is not provided in our facility. The patient also has the right and is explained the right to defer or refuse any part of the evaluation or treatment including the internal exam. With the patient's consent, PT will use one gloved finger to gently assess the muscles of the pelvic floor, seeing how well it contracts and relaxes and if there is muscle symmetry. After, the patient will get dressed and PT and patient will discuss exam findings and plan of care. PT and patient discuss plan of care, schedule, attendance policy and HEP activities.   PELVIC MMT:   MMT eval  Vaginal 2/5, 3 quick flicks, 2 second holds   Internal Anal Sphincter   External Anal Sphincter   Puborectalis   Diastasis Recti   (Blank rows = not tested)        TONE: Normal tone  PROLAPSE: Anterior wall to the vaginal opening  TODAY'S TREATMENT:                                                                                                                               DATE:   06/02/2022 EVAL and HEP  NeuroReEd: supine Diaphragmatic breathing with pelvic floor contraction    PATIENT EDUCATION:  Education details: Prolapse positions and HEP  Person educated: Patient Education method: Explanation, Demonstration, and Handouts Education comprehension: verbalized understanding  HOME EXERCISE PROGRAM: Access Code: [email or web address] URL: [email or web address] Date: 06/02/2022 Prepared by: Dwana Curd  Exercises - Supine Pelvic Floor Contraction  - 3 x daily - 7 x weekly - 1 sets - 10 reps  ASSESSMENT:  CLINICAL IMPRESSION: Patient is a 36 y.o. female who was seen today for physical therapy evaluation and treatment for weakness of pelvic floor . The functional assessment demonstrated hip ROM and strength deficits primarily with hip flexion and internal rotation. The internal assessment the patient demonstrate endurance, coordination, and strength deficits with a 2/5 MMT,  3 quick flicks and 2 seconds hold. Patient received a HEP to address pelvic floor coordination and strengthening.  Patient would benefit from skilled therapy to address pelvic floor weakness, coordination, and hip strengthening for improved QOL.   OBJECTIVE IMPAIRMENTS: decreased coordination, decreased endurance, decreased ROM, decreased strength, and pain.   ACTIVITY LIMITATIONS: continence  PARTICIPATION LIMITATIONS: community activity  PERSONAL FACTORS: 3+ comorbidities: Anxiety, spontaneous miscarriage, 2 cesarean   are also affecting patient's functional outcome.  REHAB POTENTIAL: Good  CLINICAL DECISION MAKING: Evolving/moderate complexity  EVALUATION COMPLEXITY: Moderate   GOALS: Goals reviewed with patient? Yes  SHORT TERM GOALS: Target date: 07/14/2022  Patient will be independent with initial HEP   Baseline: Goal status: INITIAL  LONG TERM GOALS: Target date: 08/25/2022  Pt will be  independent with advanced HEP to maintain improvements made throughout therapy  Baseline:  Goal status: INITIAL  2.  Patient will report 80% less discomfort when bladder is full for improvement of QOL Baseline:  Goal status: INITIAL  3.  Pt will report 100% reduction of pain due to improvements in posture, strength, and muscle length  Baseline:  Goal status: INITIAL  4.  Pt will have 80% less urgency due to bladder retraining and strengthening  Baseline:  Goal status: INITIAL  5.  Pt to demonstrate at least 3/5 pelvic floor strength for improved pelvic stability Baseline:  Goal status: INITIAL   6.  Pt will be able to sneeze without leakage Baseline:  Goal status: INITIAL   PLAN:  PT FREQUENCY: 1x/week  PT DURATION: 12 weeks  PLANNED INTERVENTIONS: Therapeutic exercises, Therapeutic activity, Neuromuscular re-education, Balance training, Gait training, Patient/Family education, Self Care, Joint mobilization, Dry Needling, Cryotherapy, Moist heat, scar mobilization, Biofeedback, and Manual therapy  PLAN FOR NEXT SESSION: Pelvic floor coordination exercise, stretches, STM to lumbar and glutes   Brayton CavesJakki L Desenglau, PT 06/02/2022, 12:02 PM

## 2022-06-16 ENCOUNTER — Ambulatory Visit: Payer: 59 | Admitting: Physical Therapy

## 2022-06-16 DIAGNOSIS — M62838 Other muscle spasm: Secondary | ICD-10-CM

## 2022-06-16 DIAGNOSIS — M6281 Muscle weakness (generalized): Secondary | ICD-10-CM

## 2022-06-16 DIAGNOSIS — R278 Other lack of coordination: Secondary | ICD-10-CM

## 2022-06-16 DIAGNOSIS — R293 Abnormal posture: Secondary | ICD-10-CM

## 2022-06-16 DIAGNOSIS — N8189 Other female genital prolapse: Secondary | ICD-10-CM | POA: Diagnosis not present

## 2022-06-16 NOTE — Therapy (Addendum)
OUTPATIENT PHYSICAL THERAPY FEMALE PELVIC TREATMENT   Patient Name: Danielle Sanchez MRN: [id number] DOB:1985-08-30, 36 y.o., female Today's Date: 06/18/2022  END OF SESSION:  PT End of Session - 06/18/22 0943     Visit Number 2    Date for PT Re-Evaluation 08/25/22    Authorization Type [employer]    PT Start Time 1020    PT Stop Time 1100    PT Time Calculation (min) 40 min    Activity Tolerance Patient tolerated treatment well    Behavior During Therapy WFL for tasks assessed/performed              Past Medical History:  Diagnosis Date   Allergy    Anxiety    Gestational thrombocytopenia ([place])    Headache    History of bunionectomy    PONV (postoperative nausea and vomiting)    Scopolamine patch not helpful. TIVA with propofol on 05/04/21 (with decadron/zofran) - no PONV   Pregnancy induced hypertension    Spontaneous miscarriage 02/03/2021   Past Surgical History:  Procedure Laterality Date   ANTERIOR CRUCIATE LIGAMENT REPAIR Right    bone spur Left    BUNIONECTOMY Right 05/04/2021   Procedure: BUNIONECTOMY- LAPIDUS-TYPE;  Surgeon: Samara Deist, DPM;  Location: [place];  Service: Podiatry;  Laterality: Right;   CESAREAN SECTION N/A 11/01/2017   Procedure: CESAREAN SECTION;  Surgeon: Ward, Honor Loh, MD;  Location: [facility];  Service: Obstetrics;  Laterality: N/A;   CESAREAN SECTION N/A 02/18/2022   Procedure: CESAREAN SECTION;  Surgeon: Janyth Pupa, DO;  Location: [facility];  Service: Obstetrics;  Laterality: N/A;   DILATION AND CURETTAGE OF UTERUS N/A 02/03/2021   Procedure: DILATATION AND CURETTAGE;  Surgeon: Benjaman Kindler, MD;  Location: [facility];  Service: Gynecology;  Laterality: N/A;   LAPAROSCOPIC GASTRIC RESTRICTIVE DUODENAL PROCEDURE (DUODENAL SWITCH)  10/2020   MENISCUS REPAIR Right    WISDOM TOOTH EXTRACTION  2017   Patient Active Problem List   Diagnosis Date Noted   Term pregnancy 02/17/2022   BMI 38.0-38.9,adult 12/14/2021    Generalized anxiety disorder 11/30/2021   Rh negative state in antepartum period 08/31/2021   Gestational thrombocytopenia ([place]) 08/04/2021   Previous cesarean delivery affecting pregnancy, antepartum 08/03/2021   Supervision of other normal pregnancy, antepartum 07/11/2021   S/P biliopancreatic diversion with duodenal switch 11/17/2020   Migraine without status migrainosus, not intractable 07/22/2020   Prediabetes 06/14/2018   History of gestational hypertension 10/25/2017   Maternal obesity, antepartum 05/28/2017   Elevated prolactin level 05/28/2017    PCP: Trinna Post, PA-C  REFERRING PROVIDER: Donnamae Jude, MD  REFERRING DIAG:  N81.89 (ICD-10-CM) - Weakness of pelvic floor      THERAPY DIAG:  Abnormal posture  Other lack of coordination  Muscle weakness (generalized)  Other muscle spasm  Rationale for Evaluation and Treatment: Rehabilitation  ONSET DATE: August 2023  SUBJECTIVE:  06/18/22 : I still feel the same and having leakage Eval SUBJECTIVE STATEMENT: When she is sitting down she feels like she is having some pressure in her vaginal area. She gave birth recently on August 19. The symptoms started during pregnancy and has gotten worse after giving birth. She had a duodenal switch on 10/2020. Fluid intake: Yes: on average 80 oz due to breast feeding     PAIN:  Are you having pain? Yes NPRS scale: 2/10 more uncomfortable  Pain location: Vaginal and c-section scar   Pain type: aching Pain description: constant   Relieving factors: Using the restroom   PRECAUTIONS: None  WEIGHT BEARING RESTRICTIONS: No  FALLS:  Has patient fallen in last 6 months? No  LIVING ENVIRONMENT: Lives with: lives with their family Lives in: House/apartment  OCCUPATION: Work from  home   PLOF: Independent  PATIENT GOALS: Remove the uncomfortable ness   PERTINENT HISTORY:  Anxiety, spontaneous miscarriage, 2 cesarean, duodenal switch surgery Sexual abuse: No  BOWEL MOVEMENT: Pain with bowel movement: Yes depends and have some blood in bowel movement  Type of bowel movement:Strain Yes Fully empty rectum: No Leakage: No Pads: No Fiber supplement: Yes: Bloom supplement   URINATION: Pain with urination: No Fully empty bladder: No Stream: Weak than normal  Urgency: Yes:   Frequency: every 1.5-2 hours  Leakage: Sneezing Pads: No  INTERCOURSE: Pain with intercourse: Initial Penetration Ability to have vaginal penetration:  Yes:   Climax:  No  Marinoff Scale: 1/3  PREGNANCY: C-section deliveries 2 Currently pregnant No  PROLAPSE: None   OBJECTIVE:   DIAGNOSTIC FINDINGS:    PATIENT SURVEYS:    PFIQ-7   COGNITION: Overall cognitive status: Within functional limits for tasks assessed     SENSATION: Light touch: Appears intact Proprioception: Appears intact  MUSCLE LENGTH: Hamstrings: Right 80 deg; Left 75 deg  LUMBAR SPECIAL TESTS:  Active Straight leg raise test: Positive for abdominal weakness  and Single leg stance test: Positive bilateral hip drop   FUNCTIONAL TESTS:    GAIT: Comments: WFL  POSTURE: increased lumbar lordosis  PELVIC ALIGNMENT: Anteriorly directed pelvis   LUMBARAROM/PROM:  A/PROM A/PROM  eval  Flexion 25%  Extension   Right lateral flexion   Left lateral flexion   Right rotation   Left rotation    (Blank rows = not tested)  LOWER EXTREMITY ROM:  Passive ROM Right eval Left eval  Hip flexion 75% 75%  Hip extension    Hip abduction    Hip adduction    Hip internal rotation 50% 50%  Hip external rotation wfl wfl  Knee flexion    Knee extension    Ankle dorsiflexion    Ankle plantarflexion    Ankle inversion    Ankle eversion     (Blank rows = not tested)  LOWER EXTREMITY MMT:  MMT  Right eval Left eval  Hip flexion 4-/5 4+/5  Hip extension    Hip abduction 5/5 5/5  Hip adduction 5/5 5/5  Hip internal rotation 4/5 4/5  Hip external rotation 5/5 5/5  Knee flexion    Knee extension    Ankle dorsiflexion    Ankle plantarflexion    Ankle inversion    Ankle eversion     PALPATION:   General: C-section scar healed as expected                 External Perineal Exam Adductor Insertion tightness  Internal Pelvic Floor: Tenderness to palpation at Iliococcygeus and obturator internus L>R  Patient confirms identification and approves PT to assess internal pelvic floor and treatment Yes No emotional/communication barriers or cognitive limitation. Patient is motivated to learn. Patient understands and agrees with treatment goals and plan. PT explains patient will be examined in standing, sitting, and lying down to see how their muscles and joints work. When they are ready, they will be asked to remove their underwear so PT can examine their perineum. The patient is also given the option of providing their own chaperone as one is not provided in our facility. The patient also has the right and is explained the right to defer or refuse any part of the evaluation or treatment including the internal exam. With the patient's consent, PT will use one gloved finger to gently assess the muscles of the pelvic floor, seeing how well it contracts and relaxes and if there is muscle symmetry. After, the patient will get dressed and PT and patient will discuss exam findings and plan of care. PT and patient discuss plan of care, schedule, attendance policy and HEP activities.   PELVIC MMT:   MMT eval  Vaginal 2/5, 3 quick flicks, 2 second holds   Internal Anal Sphincter   External Anal Sphincter   Puborectalis   Diastasis Recti   (Blank rows = not tested)        TONE: Normal tone  PROLAPSE: Anterior wall to the vaginal opening  TODAY'S TREATMENT:                                                                                                                               DATE: 06/16/22                               Exercises: Supine hooklying - kegel 3 ways  Breathing and bulge, exhale contract Butterfly stretch Transversus abdominus activation with UE and march Standing at the wall - UE flexion with blue band Knack in stnading with staggered stance Educated and performed urge technique   DATE:   06/02/2022 EVAL and HEP  NeuroReEd: supine Diaphragmatic breathing with pelvic floor contraction    PATIENT EDUCATION:  Education details: Prolapse positions and HEP  Person educated: Patient Education method: Explanation, Demonstration, and Handouts Education comprehension: verbalized understanding  HOME EXERCISE PROGRAM: Access Code: [email or web address] URL: [email or web address] Date: 06/02/2022 Prepared by: Jari Favre  Exercises - Supine Pelvic Floor Contraction  - 3 x daily - 7 x weekly - 1 sets - 10 reps  ASSESSMENT:  CLINICAL IMPRESSION: Patient did well with initial f/u visit today. Pt was educated on knack and urge techniques. She did well with coordination of the pelvic floor with breathing and progressed exercises according to what she was able to tolerate while maintaining body awareness.  Pt will benefit from skilled PT to continue to address core strength for reduced  bladder leakage   OBJECTIVE IMPAIRMENTS: decreased coordination, decreased endurance, decreased ROM, decreased strength, and pain.   ACTIVITY LIMITATIONS: continence  PARTICIPATION LIMITATIONS: community activity  PERSONAL FACTORS: 3+ comorbidities: Anxiety, spontaneous miscarriage, 2 cesarean   are also affecting patient's functional outcome.   REHAB POTENTIAL: Good  CLINICAL DECISION MAKING: Evolving/moderate complexity  EVALUATION COMPLEXITY: Moderate   GOALS: Goals reviewed with patient? Yes  SHORT TERM GOALS: Target date:  07/14/2022  Patient will be independent with initial HEP   Baseline: Goal status: MET  LONG TERM GOALS: Target date: 08/25/2022  Pt will be independent with advanced HEP to maintain improvements made throughout therapy  Baseline:  Goal status: INITIAL  2.  Patient will report 80% less discomfort when bladder is full for improvement of QOL Baseline:  Goal status: INITIAL  3.  Pt will report 100% reduction of pain due to improvements in posture, strength, and muscle length  Baseline:  Goal status: INITIAL  4.  Pt will have 80% less urgency due to bladder retraining and strengthening  Baseline:  Goal status: INITIAL  5.  Pt to demonstrate at least 3/5 pelvic floor strength for improved pelvic stability Baseline:  Goal status: INITIAL   6.  Pt will be able to sneeze without leakage Baseline:  Goal status: INITIAL   PLAN:  PT FREQUENCY: 1x/week  PT DURATION: 12 weeks  PLANNED INTERVENTIONS: Therapeutic exercises, Therapeutic activity, Neuromuscular re-education, Balance training, Gait training, Patient/Family education, Self Care, Joint mobilization, Dry Needling, Cryotherapy, Moist heat, scar mobilization, Biofeedback, and Manual therapy  PLAN FOR NEXT SESSION: continue Pelvic floor coordination exercise, stretches, f/u on knack, STM to lumbar and glutes   [employer], PT 06/18/2022, 9:44 AM   PHYSICAL THERAPY DISCHARGE SUMMARY  Visits from Start of Care: 2   Current functional level related to goals / functional outcomes: See above goals   Remaining deficits: See above   Education / Equipment: HEP   Patient agrees to discharge. Patient goals were not met. Patient is being discharged due to not returning since the last visit.  Gustavus Bryant, PT, DPT 08/28/22 9:22 AM

## 2022-06-18 ENCOUNTER — Encounter: Payer: Self-pay | Admitting: Physical Therapy

## 2022-06-20 ENCOUNTER — Ambulatory Visit (INDEPENDENT_AMBULATORY_CARE_PROVIDER_SITE_OTHER): Payer: 59 | Admitting: Family Medicine

## 2022-06-20 ENCOUNTER — Encounter: Payer: Self-pay | Admitting: Family Medicine

## 2022-06-20 DIAGNOSIS — O9229 Other disorders of breast associated with pregnancy and the puerperium: Secondary | ICD-10-CM | POA: Diagnosis not present

## 2022-06-20 DIAGNOSIS — J988 Other specified respiratory disorders: Secondary | ICD-10-CM | POA: Insufficient documentation

## 2022-06-20 DIAGNOSIS — U071 COVID-19: Secondary | ICD-10-CM | POA: Insufficient documentation

## 2022-06-20 DIAGNOSIS — Z9884 Bariatric surgery status: Secondary | ICD-10-CM | POA: Insufficient documentation

## 2022-06-20 DIAGNOSIS — R7989 Other specified abnormal findings of blood chemistry: Secondary | ICD-10-CM

## 2022-06-20 DIAGNOSIS — Z87898 Personal history of other specified conditions: Secondary | ICD-10-CM | POA: Insufficient documentation

## 2022-06-20 DIAGNOSIS — Z1321 Encounter for screening for nutritional disorder: Secondary | ICD-10-CM | POA: Insufficient documentation

## 2022-06-20 LAB — POC COVID19 BINAXNOW: SARS Coronavirus 2 Ag: POSITIVE — AB

## 2022-06-20 NOTE — Assessment & Plan Note (Signed)
COVID testing completed; pt COVID positive No known risk factors Wishes to avoid use of antivirals given breastfeeding Discussed supportive care options

## 2022-06-20 NOTE — Progress Notes (Signed)
New patient visit   Patient: Danielle Sanchez   DOB: Nov 29, 1985   36 y.o. Female  MRN: [id number] Visit Date: 06/20/2022  Today's healthcare provider: Jacky Kindle, FNP  Patient presents for new patient visit to establish care.  Introduced to Publishing rights manager role and practice setting.  All questions answered.  Discussed provider/patient relationship and expectations.   Chief Complaint  Patient presents with   Establish Care        Subjective    Danielle Sanchez is a 36 y.o. female who presents today as a new patient to establish care.  HPI HPI     Establish Care    Additional comments:        Last edited by Consuello Closs, CMA on 06/20/2022  1:58 PM.       Lump on Breast: Pt complains of pea sized lump on right breast Pt noticed lump about a month ago   Cough: Patient complains of cough. Symptoms began 1 week ago. Cough described as productive of green/yellow sputum, improving over time. Patient denies chills and fever. Associated symptoms include chills and sore throat. .   Current treatments have included  cough drops , with poor improvement.  Past Medical History:  Diagnosis Date   Allergy    Anxiety    Gestational thrombocytopenia (HCC)    Headache    History of bunionectomy    PONV (postoperative nausea and vomiting)    Scopolamine patch not helpful. TIVA with propofol on 05/04/21 (with decadron/zofran) - no PONV   Pregnancy induced hypertension    Spontaneous miscarriage 02/03/2021   Past Surgical History:  Procedure Laterality Date   ANTERIOR CRUCIATE LIGAMENT REPAIR Right    bone spur Left    BUNIONECTOMY Right 05/04/2021   Procedure: BUNIONECTOMY- LAPIDUS-TYPE;  Surgeon: Gwyneth Revels, DPM;  Location: [facility];  Service: Podiatry;  Laterality: Right;   CESAREAN SECTION N/A 11/01/2017   Procedure: CESAREAN SECTION;  Surgeon: Ward, Elenora Fender, MD;  Location: [facility];  Service: Obstetrics;  Laterality: N/A;   CESAREAN SECTION N/A  02/18/2022   Procedure: CESAREAN SECTION;  Surgeon: Myna Hidalgo, DO;  Location: [facility];  Service: Obstetrics;  Laterality: N/A;   DILATION AND CURETTAGE OF UTERUS N/A 02/03/2021   Procedure: DILATATION AND CURETTAGE;  Surgeon: Christeen Douglas, MD;  Location: [facility];  Service: Gynecology;  Laterality: N/A;   LAPAROSCOPIC GASTRIC RESTRICTIVE DUODENAL PROCEDURE (DUODENAL SWITCH)  10/2020   MENISCUS REPAIR Right    WISDOM TOOTH EXTRACTION  2017   Family Status  Relation Name Status   Mother  (Not Specified)   Sister  (Not Specified)   MGM  (Not Specified)   Neg Hx  (Not Specified)   Family History  Problem Relation Age of Onset   Asthma Mother    Hypotension Mother    Asthma Sister    Stroke Maternal Grandmother    Diabetes Maternal Grandmother    Hypertension Maternal Grandmother    Breast cancer Neg Hx    Cancer Neg Hx    Social History   Socioeconomic History   Marital status: Married    Spouse name: Not on file   Number of children: Not on file   Years of education: Not on file   Highest education level: Not on file  Occupational History   Not on file  Tobacco Use   Smoking status: Never   Smokeless tobacco: Never  Vaping Use   Vaping Use: Never used  Substance  and Sexual Activity   Alcohol use: No   Drug use: No   Sexual activity: Yes    Birth control/protection: None  Other Topics Concern   Not on file  Social History Narrative   Not on file   Social Determinants of Health   Financial Resource Strain: Not on file  Food Insecurity: Not on file  Transportation Needs: Not on file  Physical Activity: Not on file  Stress: Not on file  Social Connections: Not on file   Outpatient Medications Prior to Visit  Medication Sig   Calcium Citrate-Vitamin D (CITRACAL + D PO)    Multiple Minerals-Vitamins (CAL MAG ZINC +D3 PO) Take 2 tablets by mouth in the morning, at noon, and at bedtime.   Omega-3 1000 MG CAPS    sertraline (ZOLOFT) 50 MG tablet Take 1  tablet (50 mg total) by mouth daily.   [DISCONTINUED] acetaminophen (TYLENOL) 500 MG tablet Take 2 tablets (1,000 mg total) by mouth every 6 (six) hours.   [DISCONTINUED] diphenhydrAMINE (BENADRYL) 25 mg capsule Take 1 capsule (25 mg total) by mouth every 6 (six) hours as needed for itching.   [DISCONTINUED] Lactic Ac-Citric Ac-Pot Bitart (PHEXXI) 1.8-1-0.4 % GEL Administer 1 applicator (5 g) intravaginally immediately before or up to 1 hour before [facility] act of vaginal intercourse as needed.   [DISCONTINUED] trimethoprim-polymyxin b (POLYTRIM) ophthalmic solution Place 1-2 drops into the right eye in the morning, at noon, in the evening, and at bedtime. For 5 days   No facility-administered medications prior to visit.   Allergies  Allergen Reactions   Promethazine Other (See Comments)    Abdominal pain/dizziness/fatigue   Azithromycin Swelling    Immunization History  Administered Date(s) Administered   Influenza,inj,Quad PF,6+ Mos 07/11/2021, 04/13/2022   Influenza-Unspecified 05/10/2017   Tdap 08/16/2017, 11/30/2021    Health Maintenance  Topic Date Due   COVID-19 Vaccine (4 - 2023-24 season) 03/03/2022   PAP SMEAR-Modifier  07/30/2025   DTaP/Tdap/Td (3 - Td or Tdap) 12/01/2031   INFLUENZA VACCINE  Completed   Hepatitis C Screening  Completed   HIV Screening  Completed   HPV VACCINES  Aged Out    Patient Care Team: Jacky Kindle, FNP as PCP - General (Family Medicine) Sidney Ace, MD as Referring Physician (Allergy) Reva Bores, MD as Consulting Physician (Obstetrics and Gynecology) Creig Hines, MD as Consulting Physician (Hematology)  Review of Systems   Objective    BP 125/78 (BP Location: Left Arm, Patient Position: Sitting, Cuff Size: Normal)   Pulse 74   Temp 98.4 F (36.9 C) (Oral)   Ht 5\' 4"  (1.626 m)   Wt 212 lb 6.4 oz (96.3 kg)   LMP 05/17/2021 (Exact Date)   SpO2 99%   Breastfeeding Yes   BMI 36.46 kg/m   Physical Exam Vitals and nursing  note reviewed.  Constitutional:      General: She is not in acute distress.    Appearance: Normal appearance. She is obese. She is not ill-appearing, toxic-appearing or diaphoretic.  HENT:     Head: Normocephalic and atraumatic.     Right Ear: Tympanic membrane, ear canal and external ear normal.     Left Ear: Tympanic membrane, ear canal and external ear normal.     Nose: Congestion present.     Mouth/Throat:     Mouth: Mucous membranes are moist.     Pharynx: Oropharynx is clear. No oropharyngeal exudate or posterior oropharyngeal erythema.  Eyes:     Extraocular  Movements: Extraocular movements intact.     Conjunctiva/sclera: Conjunctivae normal.     Pupils: Pupils are equal, round, and reactive to light.  Cardiovascular:     Rate and Rhythm: Normal rate and regular rhythm.     Pulses: Normal pulses.     Heart sounds: Normal heart sounds. No murmur heard.    No friction rub. No gallop.  Pulmonary:     Effort: Pulmonary effort is normal. No respiratory distress.     Breath sounds: Normal breath sounds. No stridor. No wheezing, rhonchi or rales.  Chest:     Chest wall: No tenderness.     Comments: Breasts: breasts appear normal, no suspicious masses, no skin or nipple changes or axillary nodes, symmetric fibrous changes in both upper outer quadrants, right breast normal without mass, skin or nipple changes or axillary nodes, left breast normal without mass, skin or nipple changes or axillary nodes, lactating, no erythema or tenderness, nipples normal  Musculoskeletal:        General: No swelling, tenderness, deformity or signs of injury. Normal range of motion.     Right lower leg: No edema.     Left lower leg: No edema.  Skin:    General: Skin is warm and dry.     Capillary Refill: Capillary refill takes less than 2 seconds.     Coloration: Skin is not jaundiced or pale.     Findings: No bruising, erythema, lesion or rash.  Neurological:     General: No focal deficit present.      Mental Status: She is alert and oriented to person, place, and time. Mental status is at baseline.     Cranial Nerves: No cranial nerve deficit.     Sensory: No sensory deficit.     Motor: No weakness.     Coordination: Coordination normal.  Psychiatric:        Mood and Affect: Mood normal.        Behavior: Behavior normal.        Thought Content: Thought content normal.        Judgment: Judgment normal.    Depression Screen    06/20/2022    2:14 PM 01/25/2022   11:36 AM 07/11/2021    9:56 AM 07/30/2020   11:14 AM  PHQ 2/9 Scores  PHQ - 2 Score 1 0 0 0  PHQ- 9 Score 4 1 2  0   Results for orders placed or performed in visit on 06/20/22  POC COVID-19  Result Value Ref Range   SARS Coronavirus 2 Ag Positive (A) Negative    Assessment & Plan      Problem List Items Addressed This Visit       Respiratory   Congestion of respiratory tract    COVID testing completed; pt COVID positive No known risk factors Wishes to avoid use of antivirals given breastfeeding Discussed supportive care options       Relevant Orders   POC COVID-19 (Completed)     Endocrine   History of prediabetes    Hx of WLS; previously had pre-diabetes prior to sx Repeat A1c Continue to recommend balanced, lower carb meals. Smaller meal size, adding snacks. Choosing water as drink of choice and increasing purposeful exercise.       Relevant Orders   Hemoglobin A1c     Other   COVID-19    Hx of resp congestion; viral in office testing positive Defer antivirals       Elevated prolactin level  Historical; may be elevated given currently breastfeeding Pt wishes to reestablish a baseline      Relevant Orders   PTH, Intact and Calcium   Encounter for vitamin deficiency screening    Given obesity, s/p pregnancy and hx of WLS Recommend B12 and Vit D as well as iron stores       Relevant Orders   B12 and Folate Panel   Vitamin D (25 hydroxy)   History of bariatric surgery   Relevant  Orders   Comprehensive Metabolic Panel (CMET)   CBC with Differential/Platelet   TSH   B12 and Folate Panel   Iron, TIBC and Ferritin Panel   Lipid panel   Hemoglobin A1c   PTH, Intact and Calcium   Vitamin D (25 hydroxy)   Morbid obesity (HCC) - Primary    Chronic, improved since WLS Hx of bilopan div via duodenal switch Wishes to lose more weight Body mass index is 36.46 kg/m. Discussed importance of healthy weight management Discussed diet and exercise       Relevant Orders   Comprehensive Metabolic Panel (CMET)   CBC with Differential/Platelet   TSH   Lipid panel   Hemoglobin A1c   Postpartum breast pain    Continue to monitor; exam reassuring Not concerning for mastitis at this time      Return in about 6 months (around 12/20/2022) for annual examination.    Leilani MerlI, Nature Kueker T Sharnette Kitamura, FNP, have reviewed all documentation for this visit. The documentation on 06/20/22 for the exam, diagnosis, procedures, and orders are all accurate and complete.  Jacky KindleElise T Candler Ginsberg, FNP  [facility] [phone] (phone) [phone] (fax)  [facility]

## 2022-06-20 NOTE — Therapy (Deleted)
OUTPATIENT PHYSICAL THERAPY FEMALE PELVIC TREATMENT   Patient Name: Danielle Sanchez MRN: [id number] DOB:Nov 02, 1985, 36 y.o., female Today's Date: 06/20/2022  END OF SESSION:     Past Medical History:  Diagnosis Date   Allergy    Anxiety    Gestational thrombocytopenia ([place])    Headache    History of bunionectomy    PONV (postoperative nausea and vomiting)    Scopolamine patch not helpful. TIVA with propofol on 05/04/21 (with decadron/zofran) - no PONV   Pregnancy induced hypertension    Spontaneous miscarriage 02/03/2021   Past Surgical History:  Procedure Laterality Date   ANTERIOR CRUCIATE LIGAMENT REPAIR Right    bone spur Left    BUNIONECTOMY Right 05/04/2021   Procedure: BUNIONECTOMY- LAPIDUS-TYPE;  Surgeon: Samara Deist, DPM;  Location: [facility];  Service: Podiatry;  Laterality: Right;   CESAREAN SECTION N/A 11/01/2017   Procedure: CESAREAN SECTION;  Surgeon: Ward, Honor Loh, MD;  Location: [facility];  Service: Obstetrics;  Laterality: N/A;   CESAREAN SECTION N/A 02/18/2022   Procedure: CESAREAN SECTION;  Surgeon: Janyth Pupa, DO;  Location: [facility];  Service: Obstetrics;  Laterality: N/A;   DILATION AND CURETTAGE OF UTERUS N/A 02/03/2021   Procedure: DILATATION AND CURETTAGE;  Surgeon: Benjaman Kindler, MD;  Location: [facility];  Service: Gynecology;  Laterality: N/A;   LAPAROSCOPIC GASTRIC RESTRICTIVE DUODENAL PROCEDURE (DUODENAL SWITCH)  10/2020   MENISCUS REPAIR Right    WISDOM TOOTH EXTRACTION  2017   Patient Active Problem List   Diagnosis Date Noted   Morbid obesity ([place]) 06/20/2022   History of prediabetes 06/20/2022   COVID-19 06/20/2022   History of bariatric surgery 06/20/2022   Encounter for vitamin deficiency screening 06/20/2022   Congestion of respiratory tract 06/20/2022   Postpartum breast pain 06/20/2022   Generalized anxiety disorder 11/30/2021   Rh negative state in antepartum period 08/31/2021   Gestational thrombocytopenia  ([place]) 08/04/2021   Previous cesarean delivery affecting pregnancy, antepartum 08/03/2021   Supervision of other normal pregnancy, antepartum 07/11/2021   S/P biliopancreatic diversion with duodenal switch 11/17/2020   Migraine without status migrainosus, not intractable 07/22/2020   Prediabetes 06/14/2018   History of gestational hypertension 10/25/2017   Maternal obesity, antepartum 05/28/2017   Elevated prolactin level 05/28/2017    PCP: Trinna Post, PA-C  REFERRING PROVIDER: Donnamae Jude, MD  REFERRING DIAG:  N81.89 (ICD-10-CM) - Weakness of pelvic floor      THERAPY DIAG:  No diagnosis found.  Rationale for Evaluation and Treatment: Rehabilitation  ONSET DATE: August 2023  SUBJECTIVE:  06/20/22 : I still feel the same and having leakage Eval SUBJECTIVE STATEMENT: When she is sitting down she feels like she is having some pressure in her vaginal area. She gave birth recently on August 19. The symptoms started during pregnancy and has gotten worse after giving birth. She had a duodenal switch on 10/2020. Fluid intake: Yes: on average 80 oz due to breast feeding     PAIN:  Are you having pain? Yes NPRS scale: 2/10 more uncomfortable  Pain location: Vaginal and c-section scar   Pain type: aching Pain description: constant   Relieving factors: Using the restroom   PRECAUTIONS: None  WEIGHT BEARING RESTRICTIONS: No  FALLS:  Has patient fallen in last 6 months? No  LIVING ENVIRONMENT: Lives with: lives with their family Lives in: House/apartment  OCCUPATION: Work from home   PLOF: Independent  PATIENT GOALS: Remove the uncomfortable ness   PERTINENT HISTORY:  Anxiety, spontaneous miscarriage, 2 cesarean, duodenal switch surgery Sexual abuse: No  BOWEL  MOVEMENT: Pain with bowel movement: Yes depends and have some blood in bowel movement  Type of bowel movement:Strain Yes Fully empty rectum: No Leakage: No Pads: No Fiber supplement: Yes: Bloom supplement   URINATION: Pain with urination: No Fully empty bladder: No Stream: Weak than normal  Urgency: Yes:   Frequency: every 1.5-2 hours  Leakage: Sneezing Pads: No  INTERCOURSE: Pain with intercourse: Initial Penetration Ability to have vaginal penetration:  Yes:   Climax:  No  Marinoff Scale: 1/3  PREGNANCY: C-section deliveries 2 Currently pregnant No  PROLAPSE: None   OBJECTIVE:   DIAGNOSTIC FINDINGS:    PATIENT SURVEYS:    PFIQ-7   COGNITION: Overall cognitive status: Within functional limits for tasks assessed     SENSATION: Light touch: Appears intact Proprioception: Appears intact  MUSCLE LENGTH: Hamstrings: Right 80 deg; Left 75 deg  LUMBAR SPECIAL TESTS:  Active Straight leg raise test: Positive for abdominal weakness  and Single leg stance test: Positive bilateral hip drop   FUNCTIONAL TESTS:    GAIT: Comments: WFL  POSTURE: increased lumbar lordosis  PELVIC ALIGNMENT: Anteriorly directed pelvis   LUMBARAROM/PROM:  A/PROM A/PROM  eval  Flexion 25%  Extension   Right lateral flexion   Left lateral flexion   Right rotation   Left rotation    (Blank rows = not tested)  LOWER EXTREMITY ROM:  Passive ROM Right eval Left eval  Hip flexion 75% 75%  Hip extension    Hip abduction    Hip adduction    Hip internal rotation 50% 50%  Hip external rotation wfl wfl  Knee flexion    Knee extension    Ankle dorsiflexion    Ankle plantarflexion    Ankle inversion    Ankle eversion     (Blank rows = not tested)  LOWER EXTREMITY MMT:  MMT Right eval Left eval  Hip flexion 4-/5 4+/5  Hip extension    Hip abduction 5/5 5/5  Hip adduction 5/5 5/5  Hip internal rotation 4/5 4/5  Hip external rotation 5/5 5/5  Knee flexion     Knee extension    Ankle dorsiflexion    Ankle plantarflexion    Ankle inversion    Ankle eversion     PALPATION:   General: C-section scar healed as expected                 External Perineal Exam Adductor Insertion tightness  Internal Pelvic Floor: Tenderness to palpation at Iliococcygeus and obturator internus L>R  Patient confirms identification and approves PT to assess internal pelvic floor and treatment Yes No emotional/communication barriers or cognitive limitation. Patient is motivated to learn. Patient understands and agrees with treatment goals and plan. PT explains patient will be examined in standing, sitting, and lying down to see how their muscles and joints work. When they are ready, they will be asked to remove their underwear so PT can examine their perineum. The patient is also given the option of providing their own chaperone as one is not provided in our facility. The patient also has the right and is explained the right to defer or refuse any part of the evaluation or treatment including the internal exam. With the patient's consent, PT will use one gloved finger to gently assess the muscles of the pelvic floor, seeing how well it contracts and relaxes and if there is muscle symmetry. After, the patient will get dressed and PT and patient will discuss exam findings and plan of care. PT and patient discuss plan of care, schedule, attendance policy and HEP activities.   PELVIC MMT:   MMT eval  Vaginal 2/5, 3 quick flicks, 2 second holds   Internal Anal Sphincter   External Anal Sphincter   Puborectalis   Diastasis Recti   (Blank rows = not tested)        TONE: Normal tone  PROLAPSE: Anterior wall to the vaginal opening  TODAY'S TREATMENT:                                                                                                                              DATE: 06/16/22                               Exercises: Supine hooklying  - kegel 3 ways  Breathing and bulge, exhale contract Butterfly stretch Transversus abdominus activation with UE and march Standing at the wall - UE flexion with blue band Knack in stnading with staggered stance Educated and performed urge technique   DATE:   06/02/2022 EVAL and HEP  NeuroReEd: supine Diaphragmatic breathing with pelvic floor contraction    PATIENT EDUCATION:  Education details: Prolapse positions and HEP  Person educated: Patient Education method: Explanation, Demonstration, and Handouts Education comprehension: verbalized understanding  HOME EXERCISE PROGRAM: Access Code: [email or web address] URL: [email or web address] Date: 06/02/2022 Prepared by: Jari Favre  Exercises - Supine Pelvic Floor Contraction  - 3 x daily - 7 x weekly - 1 sets - 10 reps  ASSESSMENT:  CLINICAL IMPRESSION: Patient did well with initial f/u visit today. Pt was educated on knack and urge techniques. She did well with coordination of the pelvic floor with breathing and progressed exercises according to what she was able to tolerate while maintaining body awareness.  Pt will benefit from skilled PT to continue to address core strength for reduced  bladder leakage   OBJECTIVE IMPAIRMENTS: decreased coordination, decreased endurance, decreased ROM, decreased strength, and pain.   ACTIVITY LIMITATIONS: continence  PARTICIPATION LIMITATIONS: community activity  PERSONAL FACTORS: 3+ comorbidities: Anxiety, spontaneous miscarriage, 2 cesarean   are also affecting patient's functional outcome.   REHAB POTENTIAL: Good  CLINICAL DECISION MAKING: Evolving/moderate complexity  EVALUATION COMPLEXITY: Moderate   GOALS: Goals reviewed with patient? Yes  SHORT TERM GOALS: Target date: 07/14/2022  Patient will be independent with initial HEP   Baseline: Goal status: MET  LONG TERM GOALS: Target date: 08/25/2022  Pt will be independent with advanced HEP to maintain  improvements made throughout therapy  Baseline:  Goal status: INITIAL  2.  Patient will report 80% less discomfort when bladder is full for improvement of QOL Baseline:  Goal status: INITIAL  3.  Pt will report 100% reduction of pain due to improvements in posture, strength, and muscle length  Baseline:  Goal status: INITIAL  4.  Pt will have 80% less urgency due to bladder retraining and strengthening  Baseline:  Goal status: INITIAL  5.  Pt to demonstrate at least 3/5 pelvic floor strength for improved pelvic stability Baseline:  Goal status: INITIAL   6.  Pt will be able to sneeze without leakage Baseline:  Goal status: INITIAL   PLAN:  PT FREQUENCY: 1x/week  PT DURATION: 12 weeks  PLANNED INTERVENTIONS: Therapeutic exercises, Therapeutic activity, Neuromuscular re-education, Balance training, Gait training, Patient/Family education, Self Care, Joint mobilization, Dry Needling, Cryotherapy, Moist heat, scar mobilization, Biofeedback, and Manual therapy  PLAN FOR NEXT SESSION: continue Pelvic floor coordination exercise, stretches, f/u on knack, STM to lumbar and glutes   Camillo Flaming Tawan Corkern, PT 06/20/2022, 5:21 PM

## 2022-06-20 NOTE — Assessment & Plan Note (Signed)
Chronic, improved since WLS Hx of bilopan div via duodenal switch Wishes to lose more weight Body mass index is 36.46 kg/m. Discussed importance of healthy weight management Discussed diet and exercise

## 2022-06-20 NOTE — Assessment & Plan Note (Signed)
Hx of WLS; previously had pre-diabetes prior to sx Repeat A1c Continue to recommend balanced, lower carb meals. Smaller meal size, adding snacks. Choosing water as drink of choice and increasing purposeful exercise.

## 2022-06-20 NOTE — Assessment & Plan Note (Signed)
Historical; may be elevated given currently breastfeeding Pt wishes to reestablish a baseline

## 2022-06-20 NOTE — Assessment & Plan Note (Signed)
Given obesity, s/p pregnancy and hx of WLS Recommend B12 and Vit D as well as iron stores

## 2022-06-20 NOTE — Assessment & Plan Note (Signed)
Continue to monitor; exam reassuring Not concerning for mastitis at this time

## 2022-06-20 NOTE — Assessment & Plan Note (Signed)
Hx of resp congestion; viral in office testing positive Defer antivirals

## 2022-06-21 ENCOUNTER — Ambulatory Visit: Payer: 59 | Admitting: Physical Therapy

## 2022-06-21 LAB — IRON,TIBC AND FERRITIN PANEL
Ferritin: 7 ng/mL — ABNORMAL LOW (ref 15–150)
Iron Saturation: 8 % — CL (ref 15–55)
Iron: 32 ug/dL (ref 27–159)
Total Iron Binding Capacity: 418 ug/dL (ref 250–450)
UIBC: 386 ug/dL (ref 131–425)

## 2022-06-21 LAB — COMPREHENSIVE METABOLIC PANEL
ALT: 21 IU/L (ref 0–32)
AST: 29 IU/L (ref 0–40)
Albumin/Globulin Ratio: 1.7 (ref 1.2–2.2)
Albumin: 4.3 g/dL (ref 3.9–4.9)
Alkaline Phosphatase: 105 IU/L (ref 44–121)
BUN/Creatinine Ratio: 15 (ref 9–23)
BUN: 12 mg/dL (ref 6–20)
Bilirubin Total: <0.2 mg/dL (ref 0.0–1.2)
CO2: 24 mmol/L (ref 20–29)
Calcium: 8.9 mg/dL (ref 8.7–10.2)
Chloride: 103 mmol/L (ref 96–106)
Creatinine, Ser: 0.81 mg/dL (ref 0.57–1.00)
Globulin, Total: 2.6 g/dL (ref 1.5–4.5)
Glucose: 88 mg/dL (ref 70–99)
Potassium: 4.1 mmol/L (ref 3.5–5.2)
Sodium: 142 mmol/L (ref 134–144)
Total Protein: 6.9 g/dL (ref 6.0–8.5)
eGFR: 98 mL/min/{1.73_m2} (ref >59)

## 2022-06-21 LAB — VITAMIN D 25 HYDROXY (VIT D DEFICIENCY, FRACTURES): Vit D, 25-Hydroxy: 41.5 ng/mL (ref 30.0–100.0)

## 2022-06-21 LAB — CBC WITH DIFFERENTIAL/PLATELET
Basophils Absolute: 0 10*3/uL (ref 0.0–0.2)
Basos: 1 %
EOS (ABSOLUTE): 0.1 10*3/uL (ref 0.0–0.4)
Eos: 1 %
Hematocrit: 38.6 % (ref 34.0–46.6)
Hemoglobin: 12.3 g/dL (ref 11.1–15.9)
Immature Grans (Abs): 0 10*3/uL (ref 0.0–0.1)
Immature Granulocytes: 0 %
Lymphocytes Absolute: 1.6 10*3/uL (ref 0.7–3.1)
Lymphs: 25 %
MCH: 25.3 pg — ABNORMAL LOW (ref 26.6–33.0)
MCHC: 31.9 g/dL (ref 31.5–35.7)
MCV: 79 fL (ref 79–97)
Monocytes Absolute: 0.4 10*3/uL (ref 0.1–0.9)
Monocytes: 6 %
Neutrophils Absolute: 4.3 10*3/uL (ref 1.4–7.0)
Neutrophils: 67 %
Platelets: 142 10*3/uL — ABNORMAL LOW (ref 150–450)
RBC: 4.87 x10E6/uL (ref 3.77–5.28)
RDW: 16 % — ABNORMAL HIGH (ref 11.7–15.4)
WBC: 6.4 10*3/uL (ref 3.4–10.8)

## 2022-06-21 LAB — LIPID PANEL
Chol/HDL Ratio: 2 ratio (ref 0.0–4.4)
Cholesterol, Total: 106 mg/dL (ref 100–199)
HDL: 52 mg/dL (ref >39)
LDL Chol Calc (NIH): 39 mg/dL (ref 0–99)
Triglycerides: 69 mg/dL (ref 0–149)
VLDL Cholesterol Cal: 15 mg/dL (ref 5–40)

## 2022-06-21 LAB — B12 AND FOLATE PANEL
Folate: >20 ng/mL (ref >3.0)
Vitamin B-12: 1979 pg/mL — ABNORMAL HIGH (ref 232–1245)

## 2022-06-21 LAB — PTH, INTACT AND CALCIUM: PTH: 44 pg/mL (ref 15–65)

## 2022-06-21 LAB — HEMOGLOBIN A1C
Est. average glucose Bld gHb Est-mCnc: 108 mg/dL
Hgb A1c MFr Bld: 5.4 % (ref 4.8–5.6)

## 2022-06-21 LAB — TSH: TSH: 2.35 u[IU]/mL (ref 0.450–4.500)

## 2022-06-21 NOTE — Progress Notes (Signed)
Parathyroid normal

## 2022-06-21 NOTE — Progress Notes (Signed)
Iron saturation and ferritin are very low; recommend follow up with hematology to assist or connect with your bariatric team for their recommendations. B-12 is elevated; showing you are receiving too much supplementation. Recommend stopping additional supplementation as you are almost 2x the high upper normal range. Cell count reflects low iron; also showing borderline low platelets. Other labs are normal and stable. Parathyroid is pending.

## 2022-06-27 ENCOUNTER — Ambulatory Visit: Payer: 59 | Admitting: Physical Therapy

## 2022-07-04 ENCOUNTER — Ambulatory Visit: Payer: 59 | Admitting: Physical Therapy

## 2022-07-10 ENCOUNTER — Encounter: Payer: 59 | Admitting: Physical Therapy

## 2022-07-17 ENCOUNTER — Encounter: Payer: 59 | Admitting: Physical Therapy

## 2022-09-15 ENCOUNTER — Encounter: Payer: Self-pay | Admitting: Family Medicine

## 2022-09-18 ENCOUNTER — Other Ambulatory Visit: Payer: Self-pay | Admitting: Family Medicine

## 2022-09-18 DIAGNOSIS — R7989 Other specified abnormal findings of blood chemistry: Secondary | ICD-10-CM

## 2022-09-18 DIAGNOSIS — Z9884 Bariatric surgery status: Secondary | ICD-10-CM

## 2022-09-22 ENCOUNTER — Inpatient Hospital Stay: Payer: 59 | Admitting: Oncology

## 2022-09-22 ENCOUNTER — Telehealth: Payer: Self-pay | Admitting: Oncology

## 2022-09-22 ENCOUNTER — Other Ambulatory Visit: Payer: Self-pay

## 2022-09-22 ENCOUNTER — Encounter: Payer: Self-pay | Admitting: Oncology

## 2022-09-22 ENCOUNTER — Inpatient Hospital Stay: Payer: 59 | Attending: Oncology

## 2022-09-22 VITALS — BP 101/70 | HR 61 | Temp 95.7°F | Resp 18 | Ht 64.0 in | Wt 205.0 lb

## 2022-09-22 DIAGNOSIS — D508 Other iron deficiency anemias: Secondary | ICD-10-CM

## 2022-09-22 DIAGNOSIS — Z79899 Other long term (current) drug therapy: Secondary | ICD-10-CM | POA: Diagnosis not present

## 2022-09-22 DIAGNOSIS — D509 Iron deficiency anemia, unspecified: Secondary | ICD-10-CM | POA: Insufficient documentation

## 2022-09-22 HISTORY — DX: Iron deficiency anemia, unspecified: D50.9

## 2022-09-22 LAB — IRON AND TIBC
Iron: 36 ug/dL (ref 28–170)
Saturation Ratios: 7 % — ABNORMAL LOW (ref 10.4–31.8)
TIBC: 507 ug/dL — ABNORMAL HIGH (ref 250–450)
UIBC: 471 ug/dL

## 2022-09-22 LAB — CBC WITH DIFFERENTIAL/PLATELET
Abs Immature Granulocytes: 0.01 10*3/uL (ref 0.00–0.07)
Basophils Absolute: 0 10*3/uL (ref 0.0–0.1)
Basophils Relative: 1 %
Eosinophils Absolute: 0.1 10*3/uL (ref 0.0–0.5)
Eosinophils Relative: 1 %
HCT: 38.5 % (ref 36.0–46.0)
Hemoglobin: 12.3 g/dL (ref 12.0–15.0)
Immature Granulocytes: 0 %
Lymphocytes Relative: 25 %
Lymphs Abs: 1.6 10*3/uL (ref 0.7–4.0)
MCH: 26.8 pg (ref 26.0–34.0)
MCHC: 31.9 g/dL (ref 30.0–36.0)
MCV: 83.9 fL (ref 80.0–100.0)
Monocytes Absolute: 0.3 10*3/uL (ref 0.1–1.0)
Monocytes Relative: 5 %
Neutro Abs: 4.3 10*3/uL (ref 1.7–7.7)
Neutrophils Relative %: 68 %
Platelets: 134 10*3/uL — ABNORMAL LOW (ref 150–400)
RBC: 4.59 MIL/uL (ref 3.87–5.11)
RDW: 14.5 % (ref 11.5–15.5)
WBC: 6.4 10*3/uL (ref 4.0–10.5)
nRBC: 0 % (ref 0.0–0.2)

## 2022-09-22 LAB — FERRITIN: Ferritin: 4 ng/mL — ABNORMAL LOW (ref 11–307)

## 2022-09-22 NOTE — Telephone Encounter (Signed)
Dr. Janese Banks asked for patient to get iv iron. She is getting venofer x5. Patient knows about it

## 2022-09-22 NOTE — Telephone Encounter (Signed)
Patient was checking out

## 2022-09-22 NOTE — Progress Notes (Signed)
No concerns. 

## 2022-09-22 NOTE — Progress Notes (Signed)
error 

## 2022-09-22 NOTE — Progress Notes (Signed)
Hematology/Oncology Consult note [facility]  Telephone:([phone] [phone]  Patient Care Team: Gwyneth Sprout, FNP as PCP - General (Family Medicine) Mosetta Anis, MD as Referring Physician (Allergy) Donnamae Jude, MD as Consulting Physician (Obstetrics and Gynecology) Sindy Guadeloupe, MD as Consulting Physician (Hematology)   Name of the patient: Danielle Sanchez  [id number]  05/08/1986   Date of visit: 09/22/22  Diagnosis-iron deficiency anemia  Chief complaint/ Reason for visit- history of thrombocytopenia during pregnancy now referred for iron deficiency  Heme/Onc history: Patient is a 37 year old female who was referred for thrombocytopenia during pregnancy which was attributed to possible gestational thrombocytopenia that did not require any intervention.   She has now been referred for iron deficiency anemia.  She has a history of duodenal switch surgery that was done in 2022.  Blood work from December 2023 showed H&H of 12.3/38.6 with an MCV of 79.  B12 and folate were normal.  TIBC high at 418.  Ferritin low at 7 with an iron saturation of 8%.  Interval history-patient states that she has been on oral iron for about 3 months now.  She reports mild fatigue.  She is currently breast-feeding ECOG PS- 0 Pain scale- 0   Review of systems- Review of Systems  Constitutional:  Positive for malaise/fatigue. Negative for chills, fever and weight loss.  HENT:  Negative for congestion, ear discharge and nosebleeds.   Eyes:  Negative for blurred vision.  Respiratory:  Negative for cough, hemoptysis, sputum production, shortness of breath and wheezing.   Cardiovascular:  Negative for chest pain, palpitations, orthopnea and claudication.  Gastrointestinal:  Negative for abdominal pain, blood in stool, constipation, diarrhea, heartburn, melena, nausea and vomiting.  Genitourinary:  Negative for dysuria, flank pain, frequency, hematuria and urgency.   Musculoskeletal:  Negative for back pain, joint pain and myalgias.  Skin:  Negative for rash.  Neurological:  Negative for dizziness, tingling, focal weakness, seizures, weakness and headaches.  Endo/Heme/Allergies:  Does not bruise/bleed easily.  Psychiatric/Behavioral:  Negative for depression and suicidal ideas. The patient does not have insomnia.       Allergies  Allergen Reactions   Promethazine Other (See Comments)    Abdominal pain/dizziness/fatigue   Azithromycin Swelling     Past Medical History:  Diagnosis Date   Allergy    Anxiety    Gestational thrombocytopenia (HCC)    Headache    History of bunionectomy    PONV (postoperative nausea and vomiting)    Scopolamine patch not helpful. TIVA with propofol on 05/04/21 (with decadron/zofran) - no PONV   Pregnancy induced hypertension    Spontaneous miscarriage 02/03/2021     Past Surgical History:  Procedure Laterality Date   ANTERIOR CRUCIATE LIGAMENT REPAIR Right    bone spur Left    BUNIONECTOMY Right 05/04/2021   Procedure: BUNIONECTOMY- LAPIDUS-TYPE;  Surgeon: Samara Deist, DPM;  Location: [place];  Service: Podiatry;  Laterality: Right;   CESAREAN SECTION N/A 11/01/2017   Procedure: CESAREAN SECTION;  Surgeon: Ward, Honor Loh, MD;  Location: [facility];  Service: Obstetrics;  Laterality: N/A;   CESAREAN SECTION N/A 02/18/2022   Procedure: CESAREAN SECTION;  Surgeon: Janyth Pupa, DO;  Location: [facility];  Service: Obstetrics;  Laterality: N/A;   DILATION AND CURETTAGE OF UTERUS N/A 02/03/2021   Procedure: DILATATION AND CURETTAGE;  Surgeon: Benjaman Kindler, MD;  Location: [facility];  Service: Gynecology;  Laterality: N/A;   LAPAROSCOPIC GASTRIC RESTRICTIVE DUODENAL PROCEDURE (DUODENAL SWITCH)  10/2020   MENISCUS REPAIR Right    WISDOM TOOTH EXTRACTION  2017    Social History   Socioeconomic History   Marital status: Married    Spouse name: Not on file   Number of children: Not on file    Years of education: Not on file   Highest education level: Not on file  Occupational History   Not on file  Tobacco Use   Smoking status: Never   Smokeless tobacco: Never  Vaping Use   Vaping Use: Never used  Substance and Sexual Activity   Alcohol use: No   Drug use: No   Sexual activity: Yes    Birth control/protection: None  Other Topics Concern   Not on file  Social History Narrative   Not on file   Social Determinants of Health   Financial Resource Strain: Not on file  Food Insecurity: Food Insecurity Present (09/22/2022)   Hunger Vital Sign    Worried About Running Out of Food in the Last Year: Sometimes true    Ran Out of Food in the Last Year: Never true  Transportation Needs: No Transportation Needs (09/22/2022)   PRAPARE - Hydrologist (Medical): No    Lack of Transportation (Non-Medical): No  Physical Activity: Not on file  Stress: Not on file  Social Connections: Not on file  Intimate Partner Violence: Not At Risk (09/22/2022)   Humiliation, Afraid, Rape, and Kick questionnaire    Fear of Current or Ex-Partner: No    Emotionally Abused: No    Physically Abused: No    Sexually Abused: No    Family History  Problem Relation Age of Onset   Asthma Mother    Hypotension Mother    Asthma Sister    Stroke Maternal Grandmother    Diabetes Maternal Grandmother    Hypertension Maternal Grandmother    Breast cancer Neg Hx    Cancer Neg Hx      Current Outpatient Medications:    Calcium Citrate-Vitamin D (CITRACAL + D PO), , Disp: , Rfl:    Multiple Minerals-Vitamins (CAL MAG ZINC +D3 PO), Take 2 tablets by mouth in the morning, at noon, and at bedtime., Disp: , Rfl:    Omega-3 1000 MG CAPS, , Disp: , Rfl:    sertraline (ZOLOFT) 50 MG tablet, Take 1 tablet (50 mg total) by mouth daily., Disp: 90 tablet, Rfl: 3  Physical exam:  Vitals:   09/22/22 1131  BP: 101/70  Pulse: 61  Resp: 18  Temp: (!) 95.7 F (35.4 C)  TempSrc:  Tympanic  SpO2: 100%  Weight: 205 lb (93 kg)  Height: 5\' 4"  (1.626 m)   Physical Exam Cardiovascular:     Rate and Rhythm: Normal rate and regular rhythm.     Heart sounds: Normal heart sounds.  Pulmonary:     Effort: Pulmonary effort is normal.     Breath sounds: Normal breath sounds.  Skin:    General: Skin is warm and dry.  Neurological:     Mental Status: She is alert and oriented to person, place, and time.         Latest Ref Rng & Units 06/20/2022    2:45 PM  CMP  Glucose 70 - 99 mg/dL 88   BUN 6 - 20 mg/dL 12   Creatinine 0.57 - 1.00 mg/dL 0.81   Sodium 134 - 144 mmol/L 142   Potassium 3.5 - 5.2 mmol/L 4.1   Chloride 96 - 106 mmol/L  103   CO2 20 - 29 mmol/L 24   Calcium 8.7 - 10.2 mg/dL 8.9   Total Protein 6.0 - 8.5 g/dL 6.9   Total Bilirubin 0.0 - 1.2 mg/dL <0.2   Alkaline Phos 44 - 121 IU/L 105   AST 0 - 40 IU/L 29   ALT 0 - 32 IU/L 21       Latest Ref Rng & Units 09/22/2022   12:38 PM  CBC  WBC 4.0 - 10.5 K/uL 6.4   Hemoglobin 12.0 - 15.0 g/dL 12.3   Hematocrit 36.0 - 46.0 % 38.5   Platelets 150 - 400 K/uL 134     No images are attached to the encounter.  No results found.   Assessment and plan- Patient is a 37 y.o. female referred for iron deficiency without overt anemia  Her labs from December 2023 are suggestive of iron deficiency.  I am repeating her CBC ferritin and iron studies today.  We will proceed with IV iron at this time either Feraheme x 2 or Venofer x 5 depending on what her insurance would allow.  Since patient has already been on oral iron and has undergone duodenal switch surgery it is unlikely that continued oral iron therapy would help.  Discussed risks and benefits of IV iron including all but not limited to possible risk of infusion anaphylactic reaction.  Patient understands and agrees to proceed as planned.  I will repeat CBC ferritin and iron studies in 3 and 6 months and see her back in 6 months   Visit Diagnosis 1. Other  iron deficiency anemia      Dr. Randa Evens, MD, MPH [facility] [id number] 09/22/2022 4:09 PM

## 2022-09-25 ENCOUNTER — Inpatient Hospital Stay: Payer: 59

## 2022-09-25 VITALS — BP 109/77 | HR 63 | Temp 97.2°F | Resp 16

## 2022-09-25 DIAGNOSIS — D508 Other iron deficiency anemias: Secondary | ICD-10-CM

## 2022-09-25 MED ORDER — SODIUM CHLORIDE 0.9 % IV SOLN
Freq: Once | INTRAVENOUS | Status: AC
  Filled 2022-09-25: qty 250

## 2022-09-25 MED ORDER — SODIUM CHLORIDE 0.9 % IV SOLN
200.0000 mg | INTRAVENOUS | Status: DC
  Administered 2022-09-25: 200 mg via INTRAVENOUS
  Filled 2022-09-25: qty 10

## 2022-09-25 NOTE — Patient Instructions (Signed)

## 2022-09-28 ENCOUNTER — Inpatient Hospital Stay: Payer: 59

## 2022-09-28 VITALS — BP 138/84 | HR 65 | Temp 98.7°F | Resp 16

## 2022-09-28 DIAGNOSIS — D508 Other iron deficiency anemias: Secondary | ICD-10-CM

## 2022-09-28 LAB — PREGNANCY, URINE: Preg Test, Ur: NEGATIVE

## 2022-09-28 MED ORDER — SODIUM CHLORIDE 0.9 % IV SOLN
200.0000 mg | INTRAVENOUS | Status: DC
  Administered 2022-09-28: 200 mg via INTRAVENOUS
  Filled 2022-09-28: qty 200

## 2022-09-28 MED ORDER — SODIUM CHLORIDE 0.9 % IV SOLN
Freq: Once | INTRAVENOUS | Status: AC
  Filled 2022-09-28: qty 250

## 2022-09-28 MED FILL — Iron Sucrose Inj 20 MG/ML (Fe Equiv): INTRAVENOUS | Qty: 10 | Status: AC

## 2022-10-02 ENCOUNTER — Inpatient Hospital Stay: Payer: 59 | Attending: Oncology

## 2022-10-02 VITALS — BP 122/74 | HR 69 | Temp 97.1°F | Resp 16

## 2022-10-02 DIAGNOSIS — D508 Other iron deficiency anemias: Secondary | ICD-10-CM | POA: Insufficient documentation

## 2022-10-02 MED ORDER — SODIUM CHLORIDE 0.9 % IV SOLN
200.0000 mg | INTRAVENOUS | Status: DC
  Administered 2022-10-02: 200 mg via INTRAVENOUS
  Filled 2022-10-02: qty 200

## 2022-10-02 MED ORDER — SODIUM CHLORIDE 0.9 % IV SOLN
Freq: Once | INTRAVENOUS | Status: AC
  Filled 2022-10-02: qty 250

## 2022-10-02 NOTE — Patient Instructions (Signed)

## 2022-10-02 NOTE — Progress Notes (Signed)
Pt declined 30 minute post observation 

## 2022-10-04 ENCOUNTER — Inpatient Hospital Stay: Payer: 59

## 2022-10-04 VITALS — BP 128/85 | HR 69 | Temp 98.3°F | Resp 16

## 2022-10-04 DIAGNOSIS — D508 Other iron deficiency anemias: Secondary | ICD-10-CM

## 2022-10-04 MED ORDER — SODIUM CHLORIDE 0.9 % IV SOLN
Freq: Once | INTRAVENOUS | Status: AC
  Filled 2022-10-04: qty 250

## 2022-10-04 MED ORDER — SODIUM CHLORIDE 0.9 % IV SOLN
200.0000 mg | INTRAVENOUS | Status: DC
  Administered 2022-10-04: 200 mg via INTRAVENOUS
  Filled 2022-10-04: qty 200

## 2022-10-04 NOTE — Progress Notes (Signed)
Patient declined to wait the 30 minutes for post iron infusion observation today.  Post iron education done. Patient verbalized understanding. 

## 2022-10-13 ENCOUNTER — Inpatient Hospital Stay: Payer: 59

## 2022-10-13 VITALS — BP 114/82 | HR 70 | Temp 97.9°F | Resp 16

## 2022-10-13 DIAGNOSIS — D508 Other iron deficiency anemias: Secondary | ICD-10-CM

## 2022-10-13 MED ORDER — SODIUM CHLORIDE 0.9 % IV SOLN
Freq: Once | INTRAVENOUS | Status: AC
  Filled 2022-10-13: qty 250

## 2022-10-13 MED ORDER — SODIUM CHLORIDE 0.9 % IV SOLN
200.0000 mg | INTRAVENOUS | Status: DC
  Administered 2022-10-13: 200 mg via INTRAVENOUS
  Filled 2022-10-13: qty 200

## 2022-10-13 NOTE — Progress Notes (Signed)
Pt has been educated and understands. Pt declined to stay 30 mins after iron infusion. VSS.  

## 2022-12-19 ENCOUNTER — Encounter: Payer: Self-pay | Admitting: Family Medicine

## 2022-12-19 ENCOUNTER — Ambulatory Visit (INDEPENDENT_AMBULATORY_CARE_PROVIDER_SITE_OTHER): Payer: 59 | Admitting: Family Medicine

## 2022-12-19 VITALS — BP 112/70 | HR 59 | Ht 64.0 in | Wt 202.0 lb

## 2022-12-19 DIAGNOSIS — R21 Rash and other nonspecific skin eruption: Secondary | ICD-10-CM

## 2022-12-19 DIAGNOSIS — F411 Generalized anxiety disorder: Secondary | ICD-10-CM

## 2022-12-19 DIAGNOSIS — M5442 Lumbago with sciatica, left side: Secondary | ICD-10-CM | POA: Insufficient documentation

## 2022-12-19 DIAGNOSIS — Z9884 Bariatric surgery status: Secondary | ICD-10-CM

## 2022-12-19 DIAGNOSIS — Z0000 Encounter for general adult medical examination without abnormal findings: Secondary | ICD-10-CM | POA: Insufficient documentation

## 2022-12-19 DIAGNOSIS — R7303 Prediabetes: Secondary | ICD-10-CM

## 2022-12-19 MED ORDER — BUSPIRONE HCL 5 MG PO TABS
5.0000 mg | ORAL_TABLET | Freq: Two times a day (BID) | ORAL | 1 refills | Status: DC | PRN
Start: 2022-12-19 — End: 2023-06-19

## 2022-12-19 MED ORDER — SERTRALINE HCL 100 MG PO TABS
100.0000 mg | ORAL_TABLET | Freq: Every day | ORAL | 1 refills | Status: DC
Start: 2022-12-19 — End: 2023-06-19

## 2022-12-19 NOTE — Assessment & Plan Note (Signed)
UTD on dental, vision Some MSK pain in L hip/back; unknown mechanism of injury- encourage APAP, ice, stretching

## 2022-12-19 NOTE — Progress Notes (Signed)
Complete physical exam   Patient: Danielle Sanchez   DOB: 1985-07-31   37 y.o. Female  MRN: [id number] Visit Date: 12/19/2022  Today's healthcare provider: Jacky Kindle, FNP   Re Introduced to nurse practitioner role and practice setting.  All questions answered.  Discussed provider/patient relationship and expectations.  Chief Complaint  Patient presents with   Annual Exam    Pt requesting to increase sertraline--   Subjective    Danielle Sanchez is a 37 y.o. female who presents today for a complete physical exam.  She reports consuming a  high protein  diet. Gym/ health club routine includes cardio, walking on track , yoga, and barre. She generally feels poorly. She reports sleeping poorly. She does have additional problems to discuss today.   HPI HPI     Annual Exam    Additional comments: Pt requesting to increase sertraline--      Last edited by Shelly Bombard, CMA on 12/19/2022  8:46 AM.       Past Medical History:  Diagnosis Date   Allergy    Anxiety    Gestational thrombocytopenia (HCC)    Headache    History of bunionectomy    PONV (postoperative nausea and vomiting)    Scopolamine patch not helpful. TIVA with propofol on 05/04/21 (with decadron/zofran) - no PONV   Pregnancy induced hypertension    Spontaneous miscarriage 02/03/2021   Past Surgical History:  Procedure Laterality Date   ANTERIOR CRUCIATE LIGAMENT REPAIR Right    bone spur Left    BUNIONECTOMY Right 05/04/2021   Procedure: BUNIONECTOMY- LAPIDUS-TYPE;  Surgeon: Gwyneth Revels, DPM;  Location: [facility];  Service: Podiatry;  Laterality: Right;   CESAREAN SECTION N/A 11/01/2017   Procedure: CESAREAN SECTION;  Surgeon: Ward, Elenora Fender, MD;  Location: [facility];  Service: Obstetrics;  Laterality: N/A;   CESAREAN SECTION N/A 02/18/2022   Procedure: CESAREAN SECTION;  Surgeon: Myna Hidalgo, DO;  Location: [facility];  Service: Obstetrics;  Laterality: N/A;   DILATION AND CURETTAGE OF  UTERUS N/A 02/03/2021   Procedure: DILATATION AND CURETTAGE;  Surgeon: Christeen Douglas, MD;  Location: [facility];  Service: Gynecology;  Laterality: N/A;   LAPAROSCOPIC GASTRIC RESTRICTIVE DUODENAL PROCEDURE (DUODENAL SWITCH)  10/2020   MENISCUS REPAIR Right    WISDOM TOOTH EXTRACTION  2017   Social History   Socioeconomic History   Marital status: Married    Spouse name: Not on file   Number of children: Not on file   Years of education: Not on file   Highest education level: Not on file  Occupational History   Not on file  Tobacco Use   Smoking status: Never   Smokeless tobacco: Never  Vaping Use   Vaping Use: Never used  Substance and Sexual Activity   Alcohol use: No   Drug use: No   Sexual activity: Yes    Birth control/protection: None  Other Topics Concern   Not on file  Social History Narrative   Not on file   Social Determinants of Health   Financial Resource Strain: Not on file  Food Insecurity: Food Insecurity Present (09/22/2022)   Hunger Vital Sign    Worried About Running Out of Food in the Last Year: Sometimes true    Ran Out of Food in the Last Year: Never true  Transportation Needs: No Transportation Needs (09/22/2022)   PRAPARE - Transportation    Lack of Transportation (Medical): No    Lack of  Transportation (Non-Medical): No  Physical Activity: Not on file  Stress: Not on file  Social Connections: Not on file  Intimate Partner Violence: Not At Risk (09/22/2022)   Humiliation, Afraid, Rape, and Kick questionnaire    Fear of Current or Ex-Partner: No    Emotionally Abused: No    Physically Abused: No    Sexually Abused: No   Family Status  Relation Name Status   Mother  (Not Specified)   Sister  (Not Specified)   MGM  (Not Specified)   Neg Hx  (Not Specified)   Family History  Problem Relation Age of Onset   Asthma Mother    Hypotension Mother    Asthma Sister    Stroke Maternal Grandmother    Diabetes Maternal Grandmother     Hypertension Maternal Grandmother    Breast cancer Neg Hx    Cancer Neg Hx    Allergies  Allergen Reactions   Promethazine Other (See Comments)    Abdominal pain/dizziness/fatigue   Azithromycin Swelling    Patient Care Team: Jacky Kindle, FNP as PCP - General (Family Medicine) Sidney Ace, MD as Referring Physician (Allergy) Reva Bores, MD as Consulting Physician (Obstetrics and Gynecology) Creig Hines, MD as Consulting Physician (Hematology)   Medications: Outpatient Medications Prior to Visit  Medication Sig   Calcium Citrate-Vitamin D (CITRACAL + D PO)    Multiple Minerals-Vitamins (CAL MAG ZINC +D3 PO) Take 2 tablets by mouth in the morning, at noon, and at bedtime.   Omega-3 1000 MG CAPS    [DISCONTINUED] sertraline (ZOLOFT) 50 MG tablet Take 1 tablet (50 mg total) by mouth daily.   No facility-administered medications prior to visit.    Review of Systems  Last CBC Lab Results  Component Value Date   WBC 6.4 09/22/2022   HGB 12.3 09/22/2022   HCT 38.5 09/22/2022   MCV 83.9 09/22/2022   MCH 26.8 09/22/2022   RDW 14.5 09/22/2022   PLT 134 (L) 09/22/2022   Last metabolic panel Lab Results  Component Value Date   GLUCOSE 88 06/20/2022   NA 142 06/20/2022   K 4.1 06/20/2022   CL 103 06/20/2022   CO2 24 06/20/2022   BUN 12 06/20/2022   CREATININE 0.81 06/20/2022   EGFR 98 06/20/2022   CALCIUM 8.9 06/20/2022   PROT 6.9 06/20/2022   ALBUMIN 4.3 06/20/2022   LABGLOB 2.6 06/20/2022   AGRATIO 1.7 06/20/2022   BILITOT <0.2 06/20/2022   ALKPHOS 105 06/20/2022   AST 29 06/20/2022   ALT 21 06/20/2022   ANIONGAP 7 02/03/2021   Last lipids Lab Results  Component Value Date   CHOL 106 06/20/2022   HDL 52 06/20/2022   LDLCALC 39 06/20/2022   TRIG 69 06/20/2022   CHOLHDL 2.0 06/20/2022   Last hemoglobin A1c Lab Results  Component Value Date   HGBA1C 5.4 06/20/2022   Last thyroid functions Lab Results  Component Value Date   TSH 2.350  06/20/2022      Objective    BP 112/70 (BP Location: Right Arm, Patient Position: Sitting, Cuff Size: Large)   Pulse (!) 59   Ht 5\' 4"  (1.626 m)   Wt 202 lb (91.6 kg)   LMP 12/03/2022   SpO2 98%   Breastfeeding Yes   BMI 34.67 kg/m   BP Readings from Last 3 Encounters:  12/19/22 112/70  10/13/22 114/82  10/04/22 128/85   Wt Readings from Last 3 Encounters:  12/19/22 202 lb (91.6 kg)  09/22/22  205 lb (93 kg)  06/20/22 212 lb 6.4 oz (96.3 kg)   SpO2 Readings from Last 3 Encounters:  12/19/22 98%  10/13/22 100%  10/04/22 100%   Physical Exam Vitals and nursing note reviewed.  Constitutional:      General: She is awake. She is not in acute distress.    Appearance: Normal appearance. She is well-developed and well-groomed. She is obese. She is not ill-appearing, toxic-appearing or diaphoretic.  HENT:     Head: Normocephalic and atraumatic.     Jaw: There is normal jaw occlusion. No trismus, tenderness, swelling or pain on movement.     Right Ear: Hearing, tympanic membrane, ear canal and external ear normal. There is no impacted cerumen.     Left Ear: Hearing, tympanic membrane, ear canal and external ear normal. There is no impacted cerumen.     Nose: Nose normal. No congestion or rhinorrhea.     Right Turbinates: Not enlarged, swollen or pale.     Left Turbinates: Not enlarged, swollen or pale.     Right Sinus: No maxillary sinus tenderness or frontal sinus tenderness.     Left Sinus: No maxillary sinus tenderness or frontal sinus tenderness.     Mouth/Throat:     Lips: Pink.     Mouth: Mucous membranes are moist. No injury.     Tongue: No lesions.     Pharynx: Oropharynx is clear. Uvula midline. No pharyngeal swelling, oropharyngeal exudate, posterior oropharyngeal erythema or uvula swelling.     Tonsils: No tonsillar exudate or tonsillar abscesses.  Eyes:     General: Lids are normal. Lids are everted, no foreign bodies appreciated. Vision grossly intact. Gaze  aligned appropriately. No allergic shiner or visual field deficit.       Right eye: No discharge.        Left eye: No discharge.     Extraocular Movements: Extraocular movements intact.     Conjunctiva/sclera: Conjunctivae normal.     Right eye: Right conjunctiva is not injected. No exudate.    Left eye: Left conjunctiva is not injected. No exudate.    Pupils: Pupils are equal, round, and reactive to light.  Neck:     Thyroid: No thyroid mass, thyromegaly or thyroid tenderness.     Vascular: No carotid bruit.     Trachea: Trachea normal.  Cardiovascular:     Rate and Rhythm: Normal rate and regular rhythm.     Pulses: Normal pulses.          Carotid pulses are 2+ on the right side and 2+ on the left side.      Radial pulses are 2+ on the right side and 2+ on the left side.       Dorsalis pedis pulses are 2+ on the right side and 2+ on the left side.       Posterior tibial pulses are 2+ on the right side and 2+ on the left side.     Heart sounds: Normal heart sounds, S1 normal and S2 normal. No murmur heard.    No friction rub. No gallop.  Pulmonary:     Effort: Pulmonary effort is normal. No respiratory distress.     Breath sounds: Normal breath sounds and air entry. No stridor. No wheezing, rhonchi or rales.  Chest:     Chest wall: No tenderness.  Abdominal:     General: Abdomen is flat. Bowel sounds are normal. There is no distension.     Palpations: Abdomen is soft. There  is no mass.     Tenderness: There is no abdominal tenderness. There is no right CVA tenderness, left CVA tenderness, guarding or rebound.     Hernia: No hernia is present.  Genitourinary:    Comments: Exam deferred; denies complaints Musculoskeletal:        General: No swelling, tenderness, deformity or signs of injury. Normal range of motion.     Cervical back: Full passive range of motion without pain, normal range of motion and neck supple. No edema, rigidity or tenderness. No muscular tenderness.     Right  lower leg: No edema.     Left lower leg: No edema.  Lymphadenopathy:     Cervical: No cervical adenopathy.     Right cervical: No superficial, deep or posterior cervical adenopathy.    Left cervical: No superficial, deep or posterior cervical adenopathy.  Skin:    General: Skin is warm and dry.     Capillary Refill: Capillary refill takes less than 2 seconds.     Coloration: Skin is not jaundiced or pale.     Findings: Rash present. No bruising, erythema or lesion.     Comments: Rash below pannus  Neurological:     General: No focal deficit present.     Mental Status: She is alert and oriented to person, place, and time. Mental status is at baseline.     GCS: GCS eye subscore is 4. GCS verbal subscore is 5. GCS motor subscore is 6.     Sensory: Sensation is intact. No sensory deficit.     Motor: Motor function is intact. No weakness.     Coordination: Coordination is intact. Coordination normal.     Gait: Gait is intact. Gait normal.  Psychiatric:        Attention and Perception: Attention and perception normal.        Mood and Affect: Mood and affect normal.        Speech: Speech normal.        Behavior: Behavior normal. Behavior is cooperative.        Thought Content: Thought content normal.        Cognition and Memory: Cognition and memory normal.        Judgment: Judgment normal.     Last depression screening scores    12/19/2022    8:52 AM 09/22/2022   11:36 AM 06/20/2022    2:14 PM  PHQ 2/9 Scores  PHQ - 2 Score 1 1 1   PHQ- 9 Score 10  4   Last fall risk screening    12/19/2022    8:52 AM  Fall Risk   Falls in the past year? 0  Number falls in past yr: 0  Injury with Fall? 0   Last Audit-C alcohol use screening    12/19/2022    8:52 AM  Alcohol Use Disorder Test (AUDIT)  1. How often do you have a drink containing alcohol? 0  3. How often do you have six or more drinks on one occasion? 0   A score of 3 or more in women, and 4 or more in men indicates  increased risk for alcohol abuse, EXCEPT if all of the points are from question 1   No results found for any visits on 12/19/22.  Assessment & Plan    Routine Health Maintenance and Physical Exam  Exercise Activities and Dietary recommendations  Goals   None     Immunization History  Administered Date(s) Administered   Influenza,inj,Quad PF,6+  Mos 07/11/2021, 04/13/2022   Influenza-Unspecified 05/10/2017   Tdap 08/16/2017, 11/30/2021    Health Maintenance  Topic Date Due   COVID-19 Vaccine (5 - 2023-24 season) 03/03/2022   INFLUENZA VACCINE  02/01/2023   PAP SMEAR-Modifier  07/30/2025   DTaP/Tdap/Td (3 - Td or Tdap) 12/01/2031   Hepatitis C Screening  Completed   HIV Screening  Completed   HPV VACCINES  Aged Out    Discussed health benefits of physical activity, and encouraged her to engage in regular exercise appropriate for her age and condition.  Problem List Items Addressed This Visit       Nervous and Auditory   Acute left-sided low back pain with left-sided sciatica    Acute, MSK in nature as pt can identify target area of pain Encourage stretching, NSAIDS as able, APAP, and ice Continue to monitor.      Relevant Medications   sertraline (ZOLOFT) 100 MG tablet   busPIRone (BUSPAR) 5 MG tablet     Musculoskeletal and Integument   Skin rash in pelvic region    Acute on chronic, iso gross weight loss following WLS Body mass index is 34.67 kg/m. Continue to monitor skin; continue OTC treatment and ensure cotton based undergarments, skin remains dry, avoid excess exfoliation iso skin breakdown         Other   Annual physical exam - Primary    UTD on dental, vision Some MSK pain in L hip/back; unknown mechanism of injury- encourage APAP, ice, stretching       Relevant Orders   CBC   Comprehensive Metabolic Panel (CMET)   TSH   Lipid panel   Generalized anxiety disorder    Chronic, worsening Reports travel, ongoing stress of motherhood       Relevant Medications   sertraline (ZOLOFT) 100 MG tablet   busPIRone (BUSPAR) 5 MG tablet   History of bariatric surgery    Hx of bilio-pan diversion with duo switch C/b IDA d/t poor absorption       Relevant Orders   Iron, TIBC and Ferritin Panel   Prediabetes    Chronic, unknown Repeat A1c Continue to recommend balanced, lower carb meals. Smaller meal size, adding snacks. Choosing water as drink of choice and increasing purposeful exercise.       Relevant Orders   Hemoglobin A1c   S/P biliopancreatic diversion with duodenal switch    Chronic, stable C/b skin rash in pelvis s/s weight loss and excess skin Using otc antifungal powders at this time; continue to monitor       Return in about 8 weeks (around 02/13/2023) for anxiety and depression.    Leilani Merl, FNP, have reviewed all documentation for this visit. The documentation on 12/19/22 for the exam, diagnosis, procedures, and orders are all accurate and complete.  Jacky Kindle, FNP  [facility] [phone] (phone) [phone] (fax)  [facility]

## 2022-12-19 NOTE — Assessment & Plan Note (Signed)
Hx of bilio-pan diversion with duo switch C/b IDA d/t poor absorption

## 2022-12-19 NOTE — Assessment & Plan Note (Signed)
Chronic, unknown Repeat A1c Continue to recommend balanced, lower carb meals. Smaller meal size, adding snacks. Choosing water as drink of choice and increasing purposeful exercise.

## 2022-12-19 NOTE — Assessment & Plan Note (Signed)
Chronic, worsening Reports travel, ongoing stress of motherhood

## 2022-12-19 NOTE — Assessment & Plan Note (Signed)
Acute, MSK in nature as pt can identify target area of pain Encourage stretching, NSAIDS as able, APAP, and ice Continue to monitor.

## 2022-12-19 NOTE — Assessment & Plan Note (Signed)
Chronic, stable C/b skin rash in pelvis s/s weight loss and excess skin Using otc antifungal powders at this time; continue to monitor

## 2022-12-19 NOTE — Assessment & Plan Note (Signed)
Acute on chronic, iso gross weight loss following WLS Body mass index is 34.67 kg/m. Continue to monitor skin; continue OTC treatment and ensure cotton based undergarments, skin remains dry, avoid excess exfoliation iso skin breakdown

## 2022-12-20 LAB — LIPID PANEL
Chol/HDL Ratio: 1.9 ratio (ref 0.0–4.4)
Cholesterol, Total: 99 mg/dL — ABNORMAL LOW (ref 100–199)
HDL: 52 mg/dL (ref >39)
LDL Chol Calc (NIH): 36 mg/dL (ref 0–99)
Triglycerides: 40 mg/dL (ref 0–149)
VLDL Cholesterol Cal: 11 mg/dL (ref 5–40)

## 2022-12-20 LAB — COMPREHENSIVE METABOLIC PANEL
ALT: 23 IU/L (ref 0–32)
AST: 20 IU/L (ref 0–40)
Albumin: 4.2 g/dL (ref 3.9–4.9)
Alkaline Phosphatase: 104 IU/L (ref 44–121)
BUN/Creatinine Ratio: 16 (ref 9–23)
BUN: 12 mg/dL (ref 6–20)
Bilirubin Total: 0.3 mg/dL (ref 0.0–1.2)
CO2: 24 mmol/L (ref 20–29)
Calcium: 9.2 mg/dL (ref 8.7–10.2)
Chloride: 105 mmol/L (ref 96–106)
Creatinine, Ser: 0.76 mg/dL (ref 0.57–1.00)
Globulin, Total: 2.3 g/dL (ref 1.5–4.5)
Glucose: 74 mg/dL (ref 70–99)
Potassium: 4.1 mmol/L (ref 3.5–5.2)
Sodium: 139 mmol/L (ref 134–144)
Total Protein: 6.5 g/dL (ref 6.0–8.5)
eGFR: 105 mL/min/{1.73_m2} (ref >59)

## 2022-12-20 LAB — HEMOGLOBIN A1C
Est. average glucose Bld gHb Est-mCnc: 100 mg/dL
Hgb A1c MFr Bld: 5.1 % (ref 4.8–5.6)

## 2022-12-20 LAB — IRON,TIBC AND FERRITIN PANEL
Ferritin: 71 ng/mL (ref 15–150)
Iron Saturation: 19 % (ref 15–55)
Iron: 61 ug/dL (ref 27–159)
Total Iron Binding Capacity: 322 ug/dL (ref 250–450)
UIBC: 261 ug/dL (ref 131–425)

## 2022-12-20 LAB — CBC
Hematocrit: 41.8 % (ref 34.0–46.6)
Hemoglobin: 13.9 g/dL (ref 11.1–15.9)
MCH: 28.7 pg (ref 26.6–33.0)
MCHC: 33.3 g/dL (ref 31.5–35.7)
MCV: 86 fL (ref 79–97)
Platelets: 131 10*3/uL — ABNORMAL LOW (ref 150–450)
RBC: 4.85 x10E6/uL (ref 3.77–5.28)
RDW: 14.4 % (ref 11.7–15.4)
WBC: 5.3 10*3/uL (ref 3.4–10.8)

## 2022-12-20 LAB — TSH: TSH: 2.14 u[IU]/mL (ref 0.450–4.500)

## 2022-12-20 NOTE — Progress Notes (Signed)
Iron looks great; ferritin, iron stores increased from 4 to 71. Normal is 15-150.  All other labs are stable. Keep up the hard work.

## 2022-12-26 ENCOUNTER — Telehealth: Payer: Self-pay | Admitting: *Deleted

## 2022-12-26 ENCOUNTER — Inpatient Hospital Stay: Payer: 59

## 2022-12-26 NOTE — Telephone Encounter (Signed)
Call returned to patient and she agreed to cancel her appointment 6/28 and confirmed her appointment for 8/28

## 2022-12-26 NOTE — Telephone Encounter (Addendum)
Patient called reporting that her PCP drew labs on her for her iron levels and is askig if she still needs to come for lab appointment in our office 6/28. Please advise  Iron, TIBC and Ferritin Panel Order: [id number] Status: Final result     Visible to patient: Yes (seen)     Next appt: 12/29/2022 at 11:00 AM in Oncology ([facility])     Dx: History of bariatric surgery   2 Result Notes     1 Patient Communication       Component Ref Range & Units 7 d ago (12/19/22) 3 mo ago (09/22/22) 3 mo ago (09/22/22) 6 mo ago (06/20/22)  Total Iron Binding Capacity 250 - 450 ug/dL 295  284 High  132  UIBC 131 - 425 ug/dL 440  102 R, CM 725  Iron 27 - 159 ug/dL 61  36 R 32  Iron Saturation 15 - 55 % 19   8 Low Panic   Ferritin 15 - 150 ng/mL 71 4 Low  R, CM  7 Low   Resulting Agency [facility]         Narrative Performed by: Verdell Carmine Performed at:  [address] [place], [place]  [id number] Lab Director: Jolene Schimke MD, Phone:  [phone]    Specimen Collected: 12/19/22 09:22 Last Resulted: 12/20/22 05:36      Lab Flowsheet      Order Details      View Encounter      Lab and Collection Details      Routing      Result History    View All Conversations on this Encounter      CM=Additional comments  R=Reference range differs from displayed range      Result Care Coordination   Result Notes   Acey Lav, [facility] 12/20/2022 11:30 AM EDT Back to Top    Patient advised. Verbalized understanding   Jacky Kindle, FNP 12/20/2022  7:54 AM EDT     Iron looks great; ferritin, iron stores increased from 4 to 71. Normal is 15-150.   All other labs are stable. Keep up the hard work.     Patient Communication   Append Comments   Seen Back to Top    Iron looks great; ferritin, iron stores increased from 4 to 71. Normal is 15-150.   All other labs are stable. Keep up the hard work.  Written by Jacky Kindle, FNP  on 12/20/2022  7:54 AM EDT Seen by patient Danielle Sanchez on 12/21/2022  9:05 AM    Other Results from 12/19/2022   Contains abnormal data CBC Order: [id number] Status: Final result      Visible to patient: Yes (seen)      Next appt: 12/29/2022 at 11:00 AM in Oncology ([facility])      Dx: Annual physical exam    2 Result Notes      1 Patient Communication            Component Ref Range & Units 7 d ago (12/19/22) 3 mo ago (09/22/22) 6 mo ago (06/20/22) 10 mo ago (02/18/22) 10 mo ago (02/18/22) 10 mo ago (02/17/22) 11 mo ago (01/25/22)  WBC 3.4 - 10.8 x10E3/uL 5.3 6.4 R 6.4 25.6 High  R 9.8 R 8.6 R 9.1  RBC 3.77 - 5.28 x10E6/uL 4.85 4.59 R 4.87 3.56 Low  R 3.59 Low  R 3.98 R 4.08  Hemoglobin 11.1 -  15.9 g/dL 95.6 21.3 R 08.6 57.8 Low  R 10.8 Low  R 11.9 Low  R 12.3  Hematocrit 34.0 - 46.6 % 41.8 38.5 R 38.6 31.8 Low  R 32.4 Low  R 35.8 Low  R 36.3  MCV 79 - 97 fL 86 83.9 R 79 89.3 R 90.3 R 89.9 R 89  MCH 26.6 - 33.0 pg 28.7 26.8 R 25.3 Low  30.6 R 30.1 R 29.9 R 30.1  MCHC 31.5 - 35.7 g/dL 46.9 62.9 R 52.8 41.3 R 33.3 R 33.2 R 33.9  RDW 11.7 - 15.4 % 14.4 14.5 R 16.0 High  13.2 R 13.0 R 12.9 R 12.4  Platelets 150 - 450 x10E3/uL 131 Low  134 Low  R 142 Low  102 Low  R, CM 90 Low  R, CM 140 Low  R 141 Low   nRBC  0.0 R  0.0 R, CM 0.0 R, CM 0.0 R, CM   Basophils Relative  1 R       Basophils Absolute  0.0 R 0.0 R      Immature Granulocytes  0 R 0 R      Abs Immature Granulocytes  0.01 R, CM       Lymphs   25 R      Monocytes   6 R      Eos   1 R      Basos   1 R      Monocytes Absolute   0.4 R      EOS (ABSOLUTE)   0.1 R      Neutrophils Relative %  68 R 67 R      Immature Grans (Abs)   0.0 R      Neutro Abs  4.3 R 4.3 R      Lymphocytes Relative  25 R       Lymphs Abs  1.6 R 1.6 R      Monocytes Relative  5 R       Monocytes Absolute  0.3 R       Eosinophils Relative  1 R       Eosinophils Absolute  0.1 R       Resulting Agency [facility]  [facility]         Narrative Performed by: Verdell Carmine Performed at:  [address] [facility] [address] [place], [place]  [id number] Lab Director: Jolene Schimke MD, Phone:  [phone]    Specimen Collected: 12/19/22 09:22 Last Resulted: 12/20/22 05:36      Lab Flowsheet       Order Details       View Encounter       Lab and Collection Details       Routing       Result History     View All Conversations on this Encounter      CM=Additional comments  R=Reference range differs from displayed range      Result Care Coordination   Result Notes   Acey Lav, [facility] 12/20/2022 11:30 AM EDT Back to Top     Patient advised. Verbalized understanding   Jacky Kindle, FNP 12/20/2022  7:54 AM EDT      Iron looks great; ferritin, iron stores increased from 4 to 71. Normal is 15-150.   All other labs are stable. Keep up the hard work.     Patient Communication   Append Comments   Seen  Back to Top    Iron looks great; ferritin, iron stores increased from 4 to 71. Normal is 15-150.   All other labs are stable. Keep up the hard work.  Written by Jacky Kindle, FNP on 12/20/2022  7:54 AM EDT Seen by patient Danielle Sanchez on 12/21/2022  9:04 AM      Comprehensive Metabolic Panel (CMET) Order: [id number] Status: Final result      Visible to patient: Yes (seen)      Next appt: 12/29/2022 at 11:00 AM in Oncology ([facility])      Dx: Annual physical exam    2 Result Notes      1 Patient Communication            Component Ref Range & Units 7 d ago (12/19/22) 6 mo ago (06/20/22) 1 yr ago (08/03/21) 1 yr ago (02/03/21) 5 yr ago (11/03/17) 5 yr ago (10/30/17) 5 yr ago (10/27/17)  Glucose 70 - 99 mg/dL 74 88 77 93 CM  409 High  R 93 R  BUN 6 - 20 mg/dL 12 12 7 6  11 9   Creatinine, Ser 0.57 - 1.00 mg/dL 8.11 9.14 7.82 Low  9.56 R 0.98 R 0.74 R 0.72 R  eGFR >59 mL/min/1.73 105 98 124      BUN/Creatinine Ratio 9 - 23 16  15 13       Sodium 134 - 144 mmol/L 139 142 137 140 R  137 R 135 R  Potassium 3.5 - 5.2 mmol/L 4.1 4.1 3.9 3.2 Low  R  3.9 R 4.2 R  Chloride 96 - 106 mmol/L 105 103 104 108 R  107 R 105 R  CO2 20 - 29 mmol/L 24 24 20 25  R  24 R 24 R  Calcium 8.7 - 10.2 mg/dL 9.2 8.9 8.8 9.2 R  9.4 R 8.9 R  Total Protein 6.0 - 8.5 g/dL 6.5 6.9 6.2 6.7 R  6.6 R 6.2 Low  R  Albumin 3.9 - 4.9 g/dL 4.2 4.3 4.1 R 3.8 R  3.0 Low  R 2.7 Low  R  Globulin, Total 1.5 - 4.5 g/dL 2.3 2.6 2.1      Bilirubin Total 0.0 - 1.2 mg/dL 0.3 <2.1 0.3 0.6 R  0.1 Low  R 0.3 R  Alkaline Phosphatase 44 - 121 IU/L 104 105 81 58 R  117 R 100 R  AST 0 - 40 IU/L 20 29 19 21  R  29 R 27 R  ALT 0 - 32 IU/L 23 21 19 28  R  17 R 18 R  [facility]ing Agency [facility]         Narrative Performed by: Verdell Carmine Performed at:  [address] [facility] [address], [place], [place]  [id number] Lab Director: Jolene Schimke MD, Phone:  [phone]    Specimen Collected: 12/19/22 09:22 Last Resulted: 12/20/22 05:36      Lab Flowsheet       Order Details       View Encounter       Lab and Collection Details       Routing       Result History     View All Conversations on this Encounter      CM=Additional comments  R=Reference range differs from displayed range      Result Care Coordination   Result Notes   Acey Lav, [facility] 12/20/2022 11:30 AM EDT Back to  Top     Patient advised. Verbalized understanding   Jacky Kindle, FNP 12/20/2022  7:54 AM EDT      Iron looks great; ferritin, iron stores increased from 4 to 71. Normal is 15-150.   All other labs are stable. Keep up the hard work.     Patient Communication   Append Comments   Seen Back to Top    Iron looks great; ferritin, iron stores increased from 4 to 71. Normal is 15-150.   All other labs are stable. Keep up the hard work.  Written by Jacky Kindle, FNP on 12/20/2022  7:54 AM  EDT Seen by patient Danielle Sanchez on 12/21/2022  9:05 AM      TSH Order: [id number] Status: Final result      Visible to patient: Yes (seen)      Next appt: 12/29/2022 at 11:00 AM in Oncology ([facility])      Dx: Annual physical exam    2 Result Notes      1 Patient Communication        Component Ref Range & Units 7 d ago 6 mo ago 1 yr ago  TSH 0.450 - 4.500 uIU/mL 2.140 2.350 1.830  [facility]ing Agency [facility]         Narrative Performed by: Verdell Carmine Performed at:  [address] [facility] [address], [place], [place]  [id number] Lab Director: Jolene Schimke MD, Phone:  [phone]    Specimen Collected: 12/19/22 09:22 Last Resulted: 12/20/22 05:36      Lab Flowsheet       Order Details       View Encounter       Lab and Collection Details       Routing       Result History     View All Conversations on this Encounter        Result Care Coordination   Result Notes   Acey Lav, [facility] 12/20/2022 11:30 AM EDT Back to Top     Patient advised. Verbalized understanding   Jacky Kindle, FNP 12/20/2022  7:54 AM EDT      Iron looks great; ferritin, iron stores increased from 4 to 71. Normal is 15-150.   All other labs are stable. Keep up the hard work.     Patient Communication   Append Comments   Seen Back to Top    Iron looks great; ferritin, iron stores increased from 4 to 71. Normal is 15-150.   All other labs are stable. Keep up the hard work.  Written by Jacky Kindle, FNP on 12/20/2022  7:54 AM EDT Seen by patient Danielle Sanchez on 12/21/2022  9:05 AM      Hemoglobin A1c Order: [id number] Status: Final result      Visible to patient: Yes (seen)      Next appt: 12/29/2022 at 11:00 AM in Oncology ([facility])      Dx: Prediabetes    2 Result Notes      1 Patient Communication          Component Ref Range & Units 7 d ago (12/19/22) 6 mo ago (06/20/22) 1 yr ago (08/03/21) 4 yr ago (09/18/18) 4  yr ago (06/14/18)  Hgb A1c MFr Bld 4.8 - 5.6 % 5.1 5.4 CM 5.1 CM 5.4 R 5.8 High  CM  Comment:          Prediabetes: 5.7 - 6.4  Diabetes: >6.4          Glycemic control for adults with diabetes: <7.0  Est. average glucose Bld gHb Est-mCnc mg/dL 409 811 914  782  NFA2Z POC (<> result, manual entry)       Resulting Agency [facility]         Narrative Performed by: Verdell Carmine Performed at:  [address] [facility] [address], [place], [place]  [id number] Lab Director: Jolene Schimke MD, Phone:  [phone]    Specimen Collected: 12/19/22 09:22 Last Resulted: 12/20/22 05:36      Lab Flowsheet       Order Details       View Encounter       Lab and Collection Details       Routing       Result History     View All Conversations on this Encounter      CM=Additional comments  R=Reference range differs from displayed range      Result Care Coordination   Result Notes   Acey Lav, [facility] 12/20/2022 11:30 AM EDT Back to Top     Patient advised. Verbalized understanding   Jacky Kindle, FNP 12/20/2022  7:54 AM EDT      Iron looks great; ferritin, iron stores increased from 4 to 71. Normal is 15-150.   All other labs are stable. Keep up the hard work.     Patient Communication   Append Comments   Seen Back to Top    Iron looks great; ferritin, iron stores increased from 4 to 71. Normal is 15-150.   All other labs are stable. Keep up the hard work.  Written by Jacky Kindle, FNP on 12/20/2022  7:54 AM EDT Seen by patient Danielle Sanchez on 12/21/2022  9:05 AM       Contains abnormal data Lipid panel Order: [id number] Status: Final result      Visible to patient: Yes (seen)      Next appt: 12/29/2022 at 11:00 AM in Oncology ([facility])      Dx: Annual physical exam    2 Result Notes      1 Patient Communication        Component Ref Range & Units 7 d ago 6 mo ago 4 yr ago  Cholesterol, Total 100 - 199 mg/dL  99 Low  536 644  Triglycerides 0 - 149 mg/dL 40 69 034 High   HDL >74 mg/dL 52 52 29 Low   VLDL Cholesterol Cal 5 - 40 mg/dL 11 15 30   LDL Chol Calc (NIH) 0 - 99 mg/dL 36 39   Chol/HDL Ratio 0.0 - 4.4 ratio 1.9 2.0 CM 4.1 CM  Comment:                                   T. Chol/HDL Ratio                                             Men  Women                               1/2 Avg.Risk  3.4    3.3  Avg.Risk  5.0    4.4                                2X Avg.Risk  9.6    7.1                                3X Avg.Risk 23.4   11.0  Resulting Agency [facility]         Narrative Performed by: Verdell Carmine Performed at:  [address] [facility] [address], [place], [place]  [id number] Lab Director: Jolene Schimke MD, Phone:  [phone]    Specimen Collected: 12/19/22 09:22 Last Resulted: 12/20/22 05:36      Lab Flowsheet       Order Details       View Encounter       Lab and Collection Details       Routing       Result History     View All Conversations on this Encounter      CM=Additional comments      Result Care Coordination   Result Notes   Acey Lav, [facility] 12/20/2022 11:30 AM EDT

## 2022-12-29 ENCOUNTER — Inpatient Hospital Stay: Payer: 59

## 2023-01-02 ENCOUNTER — Encounter: Payer: Self-pay | Admitting: Family Medicine

## 2023-01-03 ENCOUNTER — Ambulatory Visit: Payer: Self-pay

## 2023-01-03 NOTE — Telephone Encounter (Signed)
  Pt says she twisted her right ankle, she sent pictures via mychart. Seeking appt today, there was a popping sound that scared her. Seeking a virtual appt preferably.  Best contact: [phone]     Chief Complaint: Right ankle injury, pain, swelling, bruising Symptoms: Above Frequency: Sunday Pertinent Negatives: Patient denies  Disposition: [] ED /[] Urgent Care (no appt availability in office) / [x] Appointment(In office/virtual)/ []  [place] [facility]/ [] Home Care/ [] Refused Recommended Disposition /[] [place] [facility]/ []  Follow-up with PCP Additional Notes:   Reason for Disposition  Large swelling or bruise (> 2 inches or 5 cm)  Answer Assessment - Initial Assessment Questions 1. MECHANISM: "How did the injury happen?"     Twisted 2. ONSET: "When did the injury happen?" (Minutes or hours ago)      Sunday 3. LOCATION: "Where is the injury located?" "Which arm?"     Right 4. APPEARANCE of INJURY: "What does the injury look like?"      Bruised, swollen 5. SEVERITY: "Can you use the arm normally?"      Moderate 6. SWELLING or BRUISING: "is there any swelling or bruising?" If Yes, ask: "How large is it? (e.g., inches, centimeters)      Yes 7. PAIN: "Is there pain?" If Yes, ask: "How bad is the pain?"    (Scale 1-10; or mild, moderate, severe)   - NONE (0): No pain.   - MILD (1-3): Doesn't interfere with normal activities.   - MODERATE (4-7): Interferes with normal activities (e.g., work or school) or awakens from sleep.   - SEVERE (8-10): Excruciating pain, unable to do any normal activities, unable to hold a cup of water.     Now - 3 8. TETANUS: For any breaks in the skin, ask: "When was the last tetanus booster?"     No 9. OTHER SYMPTOMS: "Do you have any other symptoms?"  (e.g., numbness in hand)     No 10. PREGNANCY: "Is there any chance you are pregnant?" "When was your last menstrual period?"       No  Protocols used: Arm Injury-A-AH

## 2023-01-04 NOTE — Progress Notes (Signed)
Established patient visit  Patient: Danielle Sanchez   DOB: Jul 11, 1985   37 y.o. Female  MRN: [id number] Visit Date: 01/05/2023  Today's healthcare provider: Debera Lat, PA-C   Chief Complaint  Patient presents with   Ankle Injury       Acute Visit   Subjective     HPI     Ankle Injury    Additional comments: Patient is present due to right ankle injury that took place on 12/31/22. Patient reports injury was due to rolling it coming down a platform at the playground. Associated with swelling, and discoloration. Patient reports she can not fully bare weight on it but she can enough to walk. She reports she has been taking tylenol.        Last edited by Debera Lat, PA-C on 01/05/2023  8:58 AM.           01/05/2023    8:41 AM 12/19/2022    8:52 AM 09/22/2022   11:36 AM  Depression screen PHQ 2/9  Decreased Interest 0 1 1  Down, Depressed, Hopeless 1 0 0  PHQ - 2 Score 1 1 1   Altered sleeping 2 3   Tired, decreased energy 1 2   Change in appetite 1 2   Feeling bad or failure about yourself  0 1   Trouble concentrating 0 1   Moving slowly or fidgety/restless 0 0   Suicidal thoughts 0 0   PHQ-9 Score 5 10   Difficult doing work/chores Not difficult at all Not difficult at all       12/19/2022    8:59 AM 01/25/2022   11:36 AM 07/11/2021    9:56 AM  GAD 7 : Generalized Anxiety Score  Nervous, Anxious, on Edge 3 1 1   Control/stop worrying 3 1 1   Worry too much - different things 3 2 0  Trouble relaxing 3 2 1   Restless 3 1 1   Easily annoyed or irritable 2 1 1   Afraid - awful might happen 2 2 1   Total GAD 7 Score 19 10 6   Anxiety Difficulty Somewhat difficult  Not difficult at all    Medications: Outpatient Medications Prior to Visit  Medication Sig   busPIRone (BUSPAR) 5 MG tablet Take 1 tablet (5 mg total) by mouth 2 (two) times daily as needed.   Calcium Citrate-Vitamin D (CITRACAL + D PO)    Multiple Minerals-Vitamins (CAL MAG ZINC +D3 PO) Take 2 tablets by  mouth in the morning, at noon, and at bedtime.   Omega-3 1000 MG CAPS    sertraline (ZOLOFT) 100 MG tablet Take 1 tablet (100 mg total) by mouth daily.   No facility-administered medications prior to visit.    Review of Systems  All other systems reviewed and are negative.  Except see HPI      Objective    BP 100/62 (BP Location: Right Arm, Patient Position: Sitting, Cuff Size: Large)   Pulse (!) 56   Ht 5\' 4"  (1.626 m)   Wt 204 lb 9.6 oz (92.8 kg)   LMP 12/03/2022   SpO2 100%   Breastfeeding Yes   BMI 35.12 kg/m    Physical Exam Constitutional:      General: She is not in acute distress.    Appearance: Normal appearance.  HENT:     Head: Normocephalic.  Pulmonary:     Effort: Pulmonary effort is normal. No respiratory distress.  Musculoskeletal:        General: Swelling (right ankle), tenderness (right  ankle/lateral malleolus) and signs of injury present.  Neurological:     Mental Status: She is alert and oriented to person, place, and time. Mental status is at baseline.      No results found for any visits on 01/05/23.  Assessment & Plan     Assessment and Plan    1. Acute right ankle pain 2. Right ankle swelling Acute , x 5 days Around lateral malleolus: tenderness, swelling Restriction of movements. Cannot bear weight? Advised to continue taking Tylenol/Ibuprofen with meals, ice and compression with rest. - DG Foot Complete Right; Future to rule out fracture   Will FU after imaging results will be back     No follow-ups on file.     The patient was advised to call back or seek an in-person evaluation if the symptoms worsen or if the condition fails to improve as anticipated.  I discussed the assessment and treatment plan with the patient. The patient was provided an opportunity to ask questions and all were answered. The patient agreed with the plan and demonstrated an understanding of the instructions.  I, Debera Lat, PA-C have reviewed all  documentation for this visit. The documentation on  01/05/23 for the exam, diagnosis, procedures, and orders are all accurate and complete.  Debera Lat, [facility], MMS [facility] [phone] (phone) [phone] (fax)  [facility]

## 2023-01-05 ENCOUNTER — Ambulatory Visit
Admission: RE | Admit: 2023-01-05 | Discharge: 2023-01-05 | Disposition: A | Discharge status: Home / Self Care | Payer: 59 | Source: Home / Self Care | Attending: Physician Assistant | Admitting: Physician Assistant

## 2023-01-05 ENCOUNTER — Ambulatory Visit (INDEPENDENT_AMBULATORY_CARE_PROVIDER_SITE_OTHER): Payer: 59 | Admitting: Physician Assistant

## 2023-01-05 ENCOUNTER — Ambulatory Visit
Admission: RE | Admit: 2023-01-05 | Discharge: 2023-01-05 | Disposition: A | Discharge status: Home / Self Care | Payer: 59 | Source: Ambulatory Visit | Attending: Physician Assistant | Admitting: Physician Assistant

## 2023-01-05 VITALS — BP 100/62 | HR 56 | Ht 64.0 in | Wt 204.6 lb

## 2023-01-05 DIAGNOSIS — M25571 Pain in right ankle and joints of right foot: Secondary | ICD-10-CM | POA: Insufficient documentation

## 2023-01-05 DIAGNOSIS — M25471 Effusion, right ankle: Secondary | ICD-10-CM | POA: Diagnosis not present

## 2023-02-13 ENCOUNTER — Telehealth: Payer: 59 | Admitting: Family Medicine

## 2023-02-13 ENCOUNTER — Encounter: Payer: Self-pay | Admitting: Family Medicine

## 2023-02-13 DIAGNOSIS — F39 Unspecified mood [affective] disorder: Secondary | ICD-10-CM | POA: Diagnosis not present

## 2023-02-13 DIAGNOSIS — F411 Generalized anxiety disorder: Secondary | ICD-10-CM | POA: Diagnosis not present

## 2023-02-13 NOTE — Assessment & Plan Note (Signed)
Chronic, improved with increase in zoloft to 100 mg and use of buspar PRN at 5 mg BID dosing Pt reports she is able to control her stressors during episodic occurrences and she is much  Patient's GAD is much improved from 19 to 6 however, PHQ9 is elevated from 5 to 9. Patient is OK with current medication dosing and denies concerns from conversation with complaints of depression

## 2023-02-13 NOTE — Progress Notes (Signed)
MyChart Video Visit    Virtual Visit via Video Note   This format is felt to be most appropriate for this patient at this time. Physical exam was limited by quality of the video and audio technology used for the visit.   Patient location: home Provider location: [facility] [address]  [place], [place] [address]   I discussed the limitations of evaluation and management by telemedicine and the availability of in person appointments. The patient expressed understanding and agreed to proceed.  Patient: Danielle Sanchez   DOB: 1985/10/26   37 y.o. Female  MRN: [id number] Visit Date: 02/13/2023  Today's healthcare provider: Jacky Kindle, FNP  Re Introduced to nurse practitioner role and practice setting.  All questions answered.  Discussed provider/patient relationship and expectations.   Chief Complaint  Patient presents with   Medical Management of Chronic Issues    Anxiety and depression follow-up   Subjective    HPI HPI     Medical Management of Chronic Issues    Additional comments: Anxiety and depression follow-up      Last edited by Acey Lav, CMA on 02/13/2023  9:02 AM.      Medications: Outpatient Medications Prior to Visit  Medication Sig   busPIRone (BUSPAR) 5 MG tablet Take 1 tablet (5 mg total) by mouth 2 (two) times daily as needed.   Calcium Citrate-Vitamin D (CITRACAL + D PO)    Multiple Minerals-Vitamins (CAL MAG ZINC +D3 PO) Take 2 tablets by mouth in the morning, at noon, and at bedtime.   Omega-3 1000 MG CAPS    sertraline (ZOLOFT) 100 MG tablet Take 1 tablet (100 mg total) by mouth daily.   No facility-administered medications prior to visit.   Patient is alert, oriented, well appearing. Respiratory effort normal. PE limited given nature of video visit.  Review of Systems     02/13/2023    9:04 AM 12/19/2022    8:59 AM 01/25/2022   11:36 AM 07/11/2021    9:56 AM  GAD 7 : Generalized Anxiety Score  Nervous,  Anxious, on Edge 1 3 1 1   Control/stop worrying 1 3 1 1   Worry too much - different things 1 3 2  0  Trouble relaxing 2 3 2 1   Restless 1 3 1 1   Easily annoyed or irritable 0 2 1 1   Afraid - awful might happen 0 2 2 1   Total GAD 7 Score 6 19 10 6   Anxiety Difficulty Not difficult at all Somewhat difficult  Not difficult at all      02/13/2023    9:02 AM 01/05/2023    8:41 AM 12/19/2022    8:52 AM  PHQ9 SCORE ONLY  PHQ-9 Total Score 9 5 10     Objective    There were no vitals taken for this visit.   Assessment & Plan     Problem List Items Addressed This Visit       Other   Generalized anxiety disorder - Primary    Chronic, improved with increase in zoloft to 100 mg and use of buspar PRN at 5 mg BID dosing Pt reports she is able to control her stressors during episodic occurrences and she is much  Patient's GAD is much improved from 19 to 6 however, PHQ9 is elevated from 5 to 9. Patient is OK with current medication dosing and denies concerns from conversation with complaints of depression       Mood disorder (HCC)  Chronic, stable with use of SSRI zoloft 100 mg daily and buspar 5 mg BID PRN for flares given pt is currently breastfeeding       Return if symptoms worsen or fail to improve.    I discussed the assessment and treatment plan with the patient. The patient was provided an opportunity to ask questions and all were answered. The patient agreed with the plan and demonstrated an understanding of the instructions.   The patient was advised to call back or seek an in-person evaluation if the symptoms worsen or if the condition fails to improve as anticipated.  I provided 10 minutes of face-to-face time during this encounter.  Leilani Merl, FNP, have reviewed all documentation for this visit. The documentation on 02/13/23 for the exam, diagnosis, procedures, and orders are all accurate and complete.  Jacky Kindle, FNP [facility] [phone] (phone) [phone] (fax)  [facility]

## 2023-02-13 NOTE — Assessment & Plan Note (Signed)
Chronic, stable with use of SSRI zoloft 100 mg daily and buspar 5 mg BID PRN for flares given pt is currently breastfeeding

## 2023-03-15 ENCOUNTER — Encounter: Payer: Self-pay | Admitting: Family Medicine

## 2023-03-16 ENCOUNTER — Other Ambulatory Visit: Payer: Self-pay | Admitting: Family Medicine

## 2023-03-16 DIAGNOSIS — Z77011 Contact with and (suspected) exposure to lead: Secondary | ICD-10-CM

## 2023-03-17 LAB — LEAD, BLOOD (ADULT >= 16 YRS): Lead-Whole Blood: <1 ug/dL (ref 0.0–3.4)

## 2023-03-26 ENCOUNTER — Encounter: Payer: Self-pay | Admitting: Oncology

## 2023-03-26 ENCOUNTER — Inpatient Hospital Stay: Payer: 59 | Admitting: Oncology

## 2023-03-26 ENCOUNTER — Inpatient Hospital Stay: Payer: 59

## 2023-03-28 ENCOUNTER — Ambulatory Visit: Payer: 59 | Admitting: Oncology

## 2023-03-28 ENCOUNTER — Other Ambulatory Visit: Payer: 59

## 2023-04-03 ENCOUNTER — Encounter: Payer: Self-pay | Admitting: Oncology

## 2023-04-03 ENCOUNTER — Inpatient Hospital Stay: Payer: 59 | Attending: Oncology

## 2023-04-03 ENCOUNTER — Inpatient Hospital Stay (HOSPITAL_BASED_OUTPATIENT_CLINIC_OR_DEPARTMENT_OTHER): Payer: 59 | Admitting: Oncology

## 2023-04-03 VITALS — BP 100/61 | HR 59 | Temp 96.3°F | Resp 18 | Ht 64.0 in | Wt 203.0 lb

## 2023-04-03 DIAGNOSIS — D508 Other iron deficiency anemias: Secondary | ICD-10-CM | POA: Diagnosis not present

## 2023-04-03 DIAGNOSIS — D509 Iron deficiency anemia, unspecified: Secondary | ICD-10-CM | POA: Insufficient documentation

## 2023-04-03 LAB — CBC
HCT: 42.6 % (ref 36.0–46.0)
Hemoglobin: 13.8 g/dL (ref 12.0–15.0)
MCH: 29.2 pg (ref 26.0–34.0)
MCHC: 32.4 g/dL (ref 30.0–36.0)
MCV: 90.3 fL (ref 80.0–100.0)
Platelets: 143 10*3/uL — ABNORMAL LOW (ref 150–400)
RBC: 4.72 MIL/uL (ref 3.87–5.11)
RDW: 12.3 % (ref 11.5–15.5)
WBC: 5.9 10*3/uL (ref 4.0–10.5)
nRBC: 0 % (ref 0.0–0.2)

## 2023-04-03 LAB — COMPREHENSIVE METABOLIC PANEL
ALT: 32 U/L (ref 0–44)
AST: 27 U/L (ref 15–41)
Albumin: 4 g/dL (ref 3.5–5.0)
Alkaline Phosphatase: 84 U/L (ref 38–126)
Anion gap: 8 (ref 5–15)
BUN: 15 mg/dL (ref 6–20)
CO2: 26 mmol/L (ref 22–32)
Calcium: 8.7 mg/dL — ABNORMAL LOW (ref 8.9–10.3)
Chloride: 106 mmol/L (ref 98–111)
Creatinine, Ser: 0.83 mg/dL (ref 0.44–1.00)
GFR, Estimated: >60 mL/min (ref >60)
Glucose, Bld: 57 mg/dL — ABNORMAL LOW (ref 70–99)
Potassium: 4.2 mmol/L (ref 3.5–5.1)
Sodium: 140 mmol/L (ref 135–145)
Total Bilirubin: 0.5 mg/dL (ref 0.3–1.2)
Total Protein: 7.1 g/dL (ref 6.5–8.1)

## 2023-04-03 LAB — IRON AND TIBC
Iron: 95 ug/dL (ref 28–170)
Saturation Ratios: 25 % (ref 10.4–31.8)
TIBC: 385 ug/dL (ref 250–450)
UIBC: 290 ug/dL

## 2023-04-03 NOTE — Progress Notes (Signed)
Hematology/Oncology Consult note [facility]  Telephone:([phone] [phone]  Patient Care Team: Jacky Kindle, FNP as PCP - General (Family Medicine) Sidney Ace, MD as Referring Physician (Allergy) Reva Bores, MD as Consulting Physician (Obstetrics and Gynecology) Creig Hines, MD as Consulting Physician (Hematology)   Name of the patient: Danielle Sanchez  [id number]  09/02/1985   Date of visit: 04/03/23  Diagnosis-iron deficiency anemia  Chief complaint/ Reason for visit-routine follow-up of iron deficiency anemia  Heme/Onc history: Patient is a 37 year old female who was referred for thrombocytopenia during pregnancy which was attributed to possible gestational thrombocytopenia that did not require any intervention.   She has now been referred for iron deficiency anemia.  She has a history of duodenal switch surgery that was done in 2022.  Blood work from December 2023 showed H&H of 12.3/38.6 with an MCV of 79.  B12 and folate were normal.  TIBC high at 418.  Ferritin low at 7 with an iron saturation of 8%.   Interval history-patient reports feeling better after receiving IV iron.  She is presently on oral iron once a day as well as oral B12 supplements  ECOG PS- 0 Pain scale- 0   Review of systems- Review of Systems  Constitutional:  Negative for chills, fever, malaise/fatigue and weight loss.  HENT:  Negative for congestion, ear discharge and nosebleeds.   Eyes:  Negative for blurred vision.  Respiratory:  Negative for cough, hemoptysis, sputum production, shortness of breath and wheezing.   Cardiovascular:  Negative for chest pain, palpitations, orthopnea and claudication.  Gastrointestinal:  Negative for abdominal pain, blood in stool, constipation, diarrhea, heartburn, melena, nausea and vomiting.  Genitourinary:  Negative for dysuria, flank pain, frequency, hematuria and urgency.  Musculoskeletal:  Negative for back pain,  joint pain and myalgias.  Skin:  Negative for rash.  Neurological:  Negative for dizziness, tingling, focal weakness, seizures, weakness and headaches.  Endo/Heme/Allergies:  Does not bruise/bleed easily.  Psychiatric/Behavioral:  Negative for depression and suicidal ideas. The patient does not have insomnia.       Allergies  Allergen Reactions   Promethazine Other (See Comments)    Abdominal pain/dizziness/fatigue   Azithromycin Swelling     Past Medical History:  Diagnosis Date   Allergy    Anxiety    Gestational thrombocytopenia (HCC)    Headache    History of bunionectomy    PONV (postoperative nausea and vomiting)    Scopolamine patch not helpful. TIVA with propofol on 05/04/21 (with decadron/zofran) - no PONV   Pregnancy induced hypertension    Spontaneous miscarriage 02/03/2021     Past Surgical History:  Procedure Laterality Date   ANTERIOR CRUCIATE LIGAMENT REPAIR Right    bone spur Left    BUNIONECTOMY Right 05/04/2021   Procedure: BUNIONECTOMY- LAPIDUS-TYPE;  Surgeon: Gwyneth Revels, DPM;  Location: [facility];  Service: Podiatry;  Laterality: Right;   CESAREAN SECTION N/A 11/01/2017   Procedure: CESAREAN SECTION;  Surgeon: Ward, Elenora Fender, MD;  Location: [facility];  Service: Obstetrics;  Laterality: N/A;   CESAREAN SECTION N/A 02/18/2022   Procedure: CESAREAN SECTION;  Surgeon: Myna Hidalgo, DO;  Location: [facility];  Service: Obstetrics;  Laterality: N/A;   DILATION AND CURETTAGE OF UTERUS N/A 02/03/2021   Procedure: DILATATION AND CURETTAGE;  Surgeon: Christeen Douglas, MD;  Location: [facility];  Service: Gynecology;  Laterality: N/A;   LAPAROSCOPIC GASTRIC RESTRICTIVE DUODENAL PROCEDURE (DUODENAL SWITCH)  10/2020   MENISCUS REPAIR  Right    WISDOM TOOTH EXTRACTION  2017    Social History   Socioeconomic History   Marital status: Married    Spouse name: Not on file   Number of children: Not on file   Years of education: Not on file   Highest  education level: Not on file  Occupational History   Not on file  Tobacco Use   Smoking status: Never   Smokeless tobacco: Never  Vaping Use   Vaping status: Never Used  Substance and Sexual Activity   Alcohol use: No   Drug use: No   Sexual activity: Yes    Birth control/protection: None  Other Topics Concern   Not on file  Social History Narrative   Not on file   Social Determinants of Health   Financial Resource Strain: Not on file  Food Insecurity: Food Insecurity Present (09/22/2022)   Hunger Vital Sign    Worried About Running Out of Food in the Last Year: Sometimes true    Ran Out of Food in the Last Year: Never true  Transportation Needs: No Transportation Needs (09/22/2022)   PRAPARE - Administrator, Civil Service (Medical): No    Lack of Transportation (Non-Medical): No  Physical Activity: Not on file  Stress: Not on file  Social Connections: Not on file  Intimate Partner Violence: Not At Risk (09/22/2022)   Humiliation, Afraid, Rape, and Kick questionnaire    Fear of Current or Ex-Partner: No    Emotionally Abused: No    Physically Abused: No    Sexually Abused: No    Family History  Problem Relation Age of Onset   Asthma Mother    Hypotension Mother    Asthma Sister    Stroke Maternal Grandmother    Diabetes Maternal Grandmother    Hypertension Maternal Grandmother    Breast cancer Neg Hx    Cancer Neg Hx      Current Outpatient Medications:    busPIRone (BUSPAR) 5 MG tablet, Take 1 tablet (5 mg total) by mouth 2 (two) times daily as needed., Disp: 180 tablet, Rfl: 1   Calcium Citrate-Vitamin D (CITRACAL + D PO), , Disp: , Rfl:    Multiple Minerals-Vitamins (CAL MAG ZINC +D3 PO), Take 2 tablets by mouth in the morning, at noon, and at bedtime., Disp: , Rfl:    Omega-3 1000 MG CAPS, , Disp: , Rfl:    sertraline (ZOLOFT) 100 MG tablet, Take 1 tablet (100 mg total) by mouth daily., Disp: 90 tablet, Rfl: 1  Physical exam:  Vitals:    04/03/23 1016  BP: 100/61  Pulse: (!) 59  Resp: 18  Temp: (!) 96.3 F (35.7 C)  TempSrc: Tympanic  SpO2: 100%  Weight: 203 lb (92.1 kg)  Height: 5\' 4"  (1.626 m)   Physical Exam Cardiovascular:     Rate and Rhythm: Regular rhythm.     Heart sounds: Normal heart sounds.  Pulmonary:     Effort: Pulmonary effort is normal.  Skin:    General: Skin is warm and dry.  Neurological:     Mental Status: She is alert and oriented to person, place, and time.         Latest Ref Rng & Units 04/03/2023   10:00 AM  CMP  Glucose 70 - 99 mg/dL 57   BUN 6 - 20 mg/dL 15   Creatinine 5.62 - 1.00 mg/dL 1.30   Sodium 865 - 784 mmol/L 140   Potassium 3.5 - 5.1  mmol/L 4.2   Chloride 98 - 111 mmol/L 106   CO2 22 - 32 mmol/L 26   Calcium 8.9 - 10.3 mg/dL 8.7   Total Protein 6.5 - 8.1 g/dL 7.1   Total Bilirubin 0.3 - 1.2 mg/dL 0.5   Alkaline Phos 38 - 126 U/L 84   AST 15 - 41 U/L 27   ALT 0 - 44 U/L 32       Latest Ref Rng & Units 04/03/2023   10:00 AM  CBC  WBC 4.0 - 10.5 K/uL 5.9   Hemoglobin 12.0 - 15.0 g/dL 53.6   Hematocrit 64.4 - 46.0 % 42.6   Platelets 150 - 400 K/uL 143      Assessment and plan- Patient is a 37 y.o. female here for routine follow-up of iron deficiency anemia  Patient is not presentlyAnemic with an H&H of 13.8/42.6.  Unfortunately ferritin levels were not checked today but iron studies today are normal with a normal TIBC and an iron saturation of 25%.  I do not think that the patient needs any IV iron at this time.  B12 levels are currently pending.  CBC ferritin and iron studies in 4 and 8 months and I will see her back in 8 months.  I will also check B12 levels in 8 months   Visit Diagnosis 1. Other iron deficiency anemia      Dr. Owens Shark, MD, MPH [facility] at [facility] [phone] 04/03/2023 1:23 PM

## 2023-04-04 LAB — VITAMIN B12: Vitamin B-12: 1823 pg/mL — ABNORMAL HIGH (ref 180–914)

## 2023-06-19 ENCOUNTER — Other Ambulatory Visit: Payer: Self-pay | Admitting: Family Medicine

## 2023-06-19 DIAGNOSIS — F411 Generalized anxiety disorder: Secondary | ICD-10-CM

## 2023-06-19 NOTE — Telephone Encounter (Signed)
Please advise 

## 2023-08-03 ENCOUNTER — Telehealth: Payer: Self-pay

## 2023-08-03 ENCOUNTER — Ambulatory Visit
Admission: RE | Admit: 2023-08-03 | Discharge: 2023-08-03 | Disposition: A | Discharge status: Home / Self Care | Payer: 59 | Source: Ambulatory Visit | Attending: Emergency Medicine | Admitting: Emergency Medicine

## 2023-08-03 ENCOUNTER — Inpatient Hospital Stay: Payer: 59 | Attending: Oncology

## 2023-08-03 VITALS — BP 108/73 | HR 72 | Temp 98.9°F | Resp 16

## 2023-08-03 DIAGNOSIS — J020 Streptococcal pharyngitis: Secondary | ICD-10-CM

## 2023-08-03 DIAGNOSIS — D508 Other iron deficiency anemias: Secondary | ICD-10-CM

## 2023-08-03 DIAGNOSIS — D509 Iron deficiency anemia, unspecified: Secondary | ICD-10-CM | POA: Diagnosis present

## 2023-08-03 LAB — COMPREHENSIVE METABOLIC PANEL
ALT: 22 U/L (ref 0–44)
AST: 23 U/L (ref 15–41)
Albumin: 3.7 g/dL (ref 3.5–5.0)
Alkaline Phosphatase: 80 U/L (ref 38–126)
Anion gap: 6 (ref 5–15)
BUN: 15 mg/dL (ref 6–20)
CO2: 26 mmol/L (ref 22–32)
Calcium: 8.6 mg/dL — ABNORMAL LOW (ref 8.9–10.3)
Chloride: 105 mmol/L (ref 98–111)
Creatinine, Ser: 0.78 mg/dL (ref 0.44–1.00)
GFR, Estimated: >60 mL/min (ref >60)
Glucose, Bld: 85 mg/dL (ref 70–99)
Potassium: 3.9 mmol/L (ref 3.5–5.1)
Sodium: 137 mmol/L (ref 135–145)
Total Bilirubin: 0.5 mg/dL (ref 0.0–1.2)
Total Protein: 7.4 g/dL (ref 6.5–8.1)

## 2023-08-03 LAB — POCT RAPID STREP A (OFFICE): Rapid Strep A Screen: POSITIVE — AB

## 2023-08-03 LAB — CBC
HCT: 39.4 % (ref 36.0–46.0)
Hemoglobin: 12.8 g/dL (ref 12.0–15.0)
MCH: 28.2 pg (ref 26.0–34.0)
MCHC: 32.5 g/dL (ref 30.0–36.0)
MCV: 86.8 fL (ref 80.0–100.0)
Platelets: 149 10*3/uL — ABNORMAL LOW (ref 150–400)
RBC: 4.54 MIL/uL (ref 3.87–5.11)
RDW: 12.3 % (ref 11.5–15.5)
WBC: 8.6 10*3/uL (ref 4.0–10.5)
nRBC: 0 % (ref 0.0–0.2)

## 2023-08-03 LAB — IRON AND TIBC
Iron: 61 ug/dL (ref 28–170)
Saturation Ratios: 15 % (ref 10.4–31.8)
TIBC: 410 ug/dL (ref 250–450)
UIBC: 349 ug/dL

## 2023-08-03 LAB — FERRITIN: Ferritin: 15 ng/mL (ref 11–307)

## 2023-08-03 MED ORDER — AMOXICILLIN 500 MG PO CAPS: 500.0000 mg | ORAL_CAPSULE | Freq: Two times a day (BID) | ORAL | 0 refills | Status: AC

## 2023-08-03 NOTE — Telephone Encounter (Signed)
Left message notifying patient of Dr. Smith Robert recommendations no answer, left voicemail

## 2023-08-03 NOTE — Telephone Encounter (Signed)
-----   Message from Creig Hines sent at 08/03/2023 10:55 AM EST ----- Ferritin is still low. Would she like to come for iv iron?

## 2023-08-03 NOTE — ED Triage Notes (Signed)
Pt states she had a sore throat 3 weeks ago, Then it went away for a few days and sore throat returned a week ago and now having pain and slight swelling and tender to touch behind ears and throat.   Pt is currently breastfeeding.

## 2023-08-03 NOTE — Discharge Instructions (Addendum)
Your rapid strep test today was positive  Take amoxicillin twice a day for the next 10 days, daily will see improvement in about 48 hours and steady progression from there  may use of salt gargles throat lozenges, warm liquids, teaspoons of honey and over-the-counter clippers septic spray for comfort  May give Tylenol or Motrin every 6 hours as needed for additional comfort  You may follow-up at urgent care as needed

## 2023-08-03 NOTE — ED Provider Notes (Signed)
Renaldo Fiddler    CSN: [id number] Arrival date & time: 08/03/23  4098      History   Chief Complaint Chief Complaint  Patient presents with   Sore Throat    swollen in my neck and hard to swallow/painful - Entered by patient    HPI TIMA CURET is a 38 y.o. female.   Patient presents for evaluation of sore throat initially beginning 3 weeks ago, improved on its own and then returned 7 days ago, worsening.  Accompanying nasal congestion, rhinorrhea, bilateral ear pain and a nonproductive cough.  Throat feels swollen and pain wrapping around the neck, painful to swallow.  Able to tolerate food and liquids.  Has attempted over-the-counter cough suppressant.  Possible sick contacts that she has small children.  Currently breast-feeding.  Past Medical History:  Diagnosis Date   Allergy    Anxiety    Gestational thrombocytopenia (HCC)    Headache    History of bunionectomy    PONV (postoperative nausea and vomiting)    Scopolamine patch not helpful. 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/17/2020    Past Surgical History:  Procedure Laterality Date   ANTERIOR CRUCIATE LIGAMENT REPAIR Right    bone spur Left    BUNIONECTOMY Right 05/04/2021   Procedure: BUNIONECTOMY- LAPIDUS-TYPE;  Surgeon: Gwyneth Revels, DPM;  Location: [facility];  Service: Podiatry;  Laterality: Right;   CESAREAN SECTION N/A 11/01/2017   Procedure: CESAREAN SECTION;  Surgeon: Ward, Elenora Fender, MD;  Location: [facility];  Service: Obstetrics;  Laterality: N/A;   CESAREAN SECTION N/A 02/18/2022   Procedure: CESAREAN SECTION;  Surgeon: Myna Hidalgo, DO;  Location: [facility];  Service: Obstetrics;  Laterality: N/A;   DILATION AND CURETTAGE OF UTERUS N/A 02/03/2021   Procedure: DILATATION AND CURETTAGE;  Surgeon: Christeen Douglas, MD;  Location: [facility];  Service: Gynecology;  Laterality: N/A;   LAPAROSCOPIC GASTRIC RESTRICTIVE DUODENAL PROCEDURE (DUODENAL SWITCH)  10/2020   MENISCUS REPAIR Right    WISDOM TOOTH EXTRACTION  2017    OB History     Gravida  3   Para  2   Term  2   Preterm      AB  1   Living  2      SAB  1   IAB      Ectopic      Multiple  0   Live Births  2            Home Medications    Prior to Admission medications   Medication Sig Start Date End Date Taking? Authorizing Provider  amoxicillin (AMOXIL) 500 MG capsule Take 1 capsule (500 mg total) by mouth 2 (two) times daily for 10 days. 08/03/23 08/13/23 Yes Karlisa Gaubert R, NP  busPIRone (BUSPAR) 5 MG tablet TAKE 1 TABLET BY MOUTH TWICE A DAY AS NEEDED 06/19/23  Yes Merita Norton T, FNP  Calcium Citrate-Vitamin D (CITRACAL + D PO)  11/10/21  Yes [provider]  Multiple Minerals-Vitamins (CAL MAG ZINC +D3 PO) Take 2 tablets by mouth in the morning, at noon, and at bedtime.   Yes [provider]  Omega-3 1000 MG CAPS  11/10/21  Yes [provider]  sertraline (ZOLOFT) 100 MG tablet TAKE 1 TABLET BY MOUTH DAILY 06/19/23  Yes Jacky Kindle, FNP    Family History Family History  Problem Relation Age of Onset   Asthma Mother    Hypotension Mother    Asthma Sister    Stroke Maternal Grandmother    Diabetes Maternal Grandmother    Hypertension Maternal Grandmother    Breast cancer Neg Hx    Cancer Neg Hx     Social History Social History   Tobacco Use   Smoking status: Never   Smokeless tobacco: Never  Vaping Use   Vaping status: Never Used  Substance Use Topics   Alcohol use: No   Drug use: No     Allergies   Promethazine and Azithromycin   Review of Systems Review of  Systems   Physical Exam Triage Vital Signs ED Triage Vitals [08/03/23 0955]  Encounter Vitals Group     BP 108/73     Systolic BP Percentile      Diastolic BP Percentile      Pulse Rate 72     Resp 16     Temp 98.9 F (37.2 C)     Temp Source Oral     SpO2 98 %     Weight      Height      Head Circumference      Peak Flow      Pain Score 5     Pain Loc      Pain Education      Exclude from Growth Chart    No data found.  Updated Vital Signs BP 108/73 (BP Location: Left Arm)   Pulse 72   Temp 98.9 F (37.2 C) (Oral)   Resp 16   LMP 07/20/2023 (Exact Date)   SpO2 98%   Breastfeeding Yes   Visual Acuity Right Eye Distance:   Left Eye Distance:   Bilateral Distance:    Right Eye Near:   Left Eye Near:    Bilateral Near:     Physical Exam Constitutional:      Appearance: She is well-developed.  HENT:     Head: Normocephalic.     Right Ear: Tympanic membrane and ear canal normal.     Left Ear: Tympanic membrane and ear canal normal.     Nose: Congestion present. No rhinorrhea.     Mouth/Throat:     Pharynx: Posterior oropharyngeal erythema present. No oropharyngeal exudate.     Tonsils: No tonsillar exudate.  Cardiovascular:     Rate and Rhythm: Normal rate and regular rhythm.     Heart sounds: Normal heart sounds.  Musculoskeletal:     Cervical back: Normal range of motion and neck supple.  Lymphadenopathy:     Cervical: Cervical adenopathy present.  Neurological:     Mental Status: She is alert.      UC Treatments / Results  Labs (all labs ordered are listed, but only abnormal results are displayed) Labs Reviewed  POCT RAPID STREP A (OFFICE) - Abnormal; Notable for the following components:      Result Value   Rapid Strep A Screen Positive (*)    All other components within normal limits    EKG   Radiology No results found.  Procedures Procedures (including critical care time)  Medications Ordered in UC Medications - No data to  display  Initial Impression / Assessment and Plan / UC Course  I have reviewed the triage vital signs and the nursing notes.  Pertinent labs & imaging results that were available during my care of the patient were reviewed by me and considered in my medical decision making (see chart for details).  Strep pharyngitis  Rapid testing positive, discussed with patient, amoxicillin 10-day course prescribed, may attempt salt water gargles, throat lozenges, warm to cool liquids per preference, over-the-counter Chloraseptic spray and over-the-counter analgesics for management of discomfort, may follow-up with urgent care as needed if symptoms persist or worsen, note given  Final Clinical Impressions(s) / UC Diagnoses   Final diagnoses:  Strep pharyngitis     Discharge Instructions      Your rapid strep test today was positive  Take amoxicillin twice a day for the next 10 days, daily will see improvement in about 48 hours and steady progression from there  may use of salt gargles throat lozenges, warm liquids, teaspoons of honey and over-the-counter clippers septic spray for comfort  May give Tylenol or Motrin every 6 hours as needed for additional comfort  You may follow-up at urgent care as needed      ED Prescriptions     Medication Sig Dispense Auth. Provider   amoxicillin (AMOXIL) 500 MG capsule Take 1 capsule (500 mg total) by mouth 2 (two) times daily for 10 days. 20 capsule Valinda Hoar, NP      PDMP not reviewed this encounter.   Valinda Hoar, NP 08/03/23 1022

## 2023-08-23 ENCOUNTER — Encounter: Payer: Self-pay | Admitting: Oncology

## 2023-09-12 ENCOUNTER — Encounter: Payer: Self-pay | Admitting: Family Medicine

## 2023-12-03 ENCOUNTER — Inpatient Hospital Stay: Payer: 59 | Attending: Oncology

## 2023-12-03 ENCOUNTER — Inpatient Hospital Stay (HOSPITAL_BASED_OUTPATIENT_CLINIC_OR_DEPARTMENT_OTHER): Payer: 59 | Admitting: Oncology

## 2023-12-03 DIAGNOSIS — D509 Iron deficiency anemia, unspecified: Secondary | ICD-10-CM | POA: Insufficient documentation

## 2023-12-03 DIAGNOSIS — D508 Other iron deficiency anemias: Secondary | ICD-10-CM | POA: Diagnosis not present

## 2023-12-03 LAB — IRON AND TIBC
Iron: 18 ug/dL — ABNORMAL LOW (ref 28–170)
Saturation Ratios: 4 % — ABNORMAL LOW (ref 10.4–31.8)
TIBC: 519 ug/dL — ABNORMAL HIGH (ref 250–450)
UIBC: 501 ug/dL

## 2023-12-03 LAB — CBC
HCT: 36.2 % (ref 36.0–46.0)
Hemoglobin: 11.3 g/dL — ABNORMAL LOW (ref 12.0–15.0)
MCH: 24.6 pg — ABNORMAL LOW (ref 26.0–34.0)
MCHC: 31.2 g/dL (ref 30.0–36.0)
MCV: 78.9 fL — ABNORMAL LOW (ref 80.0–100.0)
Platelets: 137 10*3/uL — ABNORMAL LOW (ref 150–400)
RBC: 4.59 MIL/uL (ref 3.87–5.11)
RDW: 13.2 % (ref 11.5–15.5)
WBC: 5 10*3/uL (ref 4.0–10.5)
nRBC: 0 % (ref 0.0–0.2)

## 2023-12-03 LAB — FERRITIN: Ferritin: 3 ng/mL — ABNORMAL LOW (ref 11–307)

## 2023-12-03 NOTE — Progress Notes (Signed)
 Hematology/Oncology Consult note [facility]  [phone] [phone]  Patient Care Team: Normie Becton, FNP as PCP - General (Family Medicine) Zara Heymann, MD as Referring Physician (Allergy) Granville Layer, MD as Consulting Physician (Obstetrics and Gynecology) Avonne Boettcher, MD as Consulting Physician (Hematology)   Name of the patient: Danielle Sanchez  [id number]  20-Nov-1985   Date of visit: 12/03/23  Diagnosis-iron  deficiency anemia  Chief complaint/ Reason for visit-routine follow-up of iron  deficiency anemia  Heme/Onc history:  Patient is a 37 year old female who was referred for thrombocytopenia during pregnancy which was attributed to possible gestational thrombocytopenia that did not require any intervention.   She has now been referred for iron  deficiency anemia.  She has a history of duodenal switch surgery that was done in 2022.  Blood work from December 2023 showed H&H of 12.3/38.6 with an MCV of 79.  B12 and folate were normal.  TIBC high at 418.  Ferritin low at 7 with an iron  saturation of 8%.   Interval history-patient reports ongoing fatigue.  Denies any blood loss in her stool or urine.  ECOG PS- 0 Pain scale- 0   Review of systems- Review of Systems  Constitutional:  Negative for chills, fever, malaise/fatigue and weight loss.  HENT:  Negative for congestion, ear discharge and nosebleeds.   Eyes:  Negative for blurred vision.  Respiratory:  Negative for cough, hemoptysis, sputum production, shortness of breath and wheezing.   Cardiovascular:  Negative for chest pain, palpitations, orthopnea and claudication.  Gastrointestinal:  Negative for abdominal pain, blood in stool, constipation, diarrhea, heartburn, melena, nausea and vomiting.  Genitourinary:  Negative for dysuria, flank pain, frequency, hematuria and urgency.  Musculoskeletal:  Negative for back pain, joint pain and myalgias.  Skin:  Negative for rash.   Neurological:  Negative for dizziness, tingling, focal weakness, seizures, weakness and headaches.  Endo/Heme/Allergies:  Does not bruise/bleed easily.  Psychiatric/Behavioral:  Negative for depression and suicidal ideas. The patient does not have insomnia.       Allergies  Allergen Reactions   Promethazine Other (See Comments)    Abdominal pain/dizziness/fatigue   Azithromycin Swelling     Past Medical History:  Diagnosis Date   Allergy    Anxiety    Gestational thrombocytopenia (HCC)    Headache    History of bunionectomy    PONV (postoperative nausea and vomiting)    Scopolamine  patch not helpful. TIVA with propofol  on 05/04/21 (with decadron /zofran ) - no PONV   Pregnancy induced hypertension    Spontaneous miscarriage 02/03/2021     Past Surgical History:  Procedure Laterality Date   ANTERIOR CRUCIATE LIGAMENT REPAIR Right    bone spur Left    BUNIONECTOMY Right 05/04/2021   Procedure: BUNIONECTOMY- LAPIDUS-TYPE;  Surgeon: Anell Baptist, DPM;  Location: [facility];  Service: Podiatry;  Laterality: Right;   CESAREAN SECTION N/A 11/01/2017   Procedure: CESAREAN SECTION;  Surgeon: Ward, Margarie Shay, MD;  Location: [facility];  Service: Obstetrics;  Laterality: N/A;   CESAREAN SECTION N/A 02/18/2022   Procedure: CESAREAN SECTION;  Surgeon: Ozan, Jennifer, DO;  Location: [facility];  Service: Obstetrics;  Laterality: N/A;   DILATION AND CURETTAGE OF UTERUS N/A 02/03/2021   Procedure: DILATATION AND CURETTAGE;  Surgeon: Prescilla Brod, MD;  Location: [facility];  Service: Gynecology;  Laterality: N/A;   LAPAROSCOPIC GASTRIC RESTRICTIVE DUODENAL PROCEDURE (DUODENAL SWITCH)  10/2020   MENISCUS REPAIR Right    WISDOM TOOTH EXTRACTION  2017  Social History   Socioeconomic History   Marital status: Married    Spouse name: Not on file   Number of children: Not on file   Years of education: Not on file   Highest education level: Not on file  Occupational History    Not on file  Tobacco Use   Smoking status: Never   Smokeless tobacco: Never  Vaping Use   Vaping status: Never Used  Substance and Sexual Activity   Alcohol use: No   Drug use: No   Sexual activity: Yes    Birth control/protection: None  Other Topics Concern   Not on file  Social History Narrative   Not on file   Social Drivers of Health   Financial Resource Strain: Not on file  Food Insecurity: Food Insecurity Present (09/22/2022)   Hunger Vital Sign    Worried About Running Out of Food in the Last Year: Sometimes true    Ran Out of Food in the Last Year: Never true  Transportation Needs: No Transportation Needs (09/22/2022)   PRAPARE - Administrator, Civil Service (Medical): No    Lack of Transportation (Non-Medical): No  Physical Activity: Not on file  Stress: Not on file  Social Connections: Not on file  Intimate Partner Violence: Not At Risk (09/22/2022)   Humiliation, Afraid, Rape, and Kick questionnaire    Fear of Current or Ex-Partner: No    Emotionally Abused: No    Physically Abused: No    Sexually Abused: No    Family History  Problem Relation Age of Onset   Asthma Mother    Hypotension Mother    Asthma Sister    Stroke Maternal Grandmother    Diabetes Maternal Grandmother    Hypertension Maternal Grandmother    Breast cancer Neg Hx    Cancer Neg Hx      Current Outpatient Medications:    busPIRone  (BUSPAR ) 5 MG tablet, TAKE 1 TABLET BY MOUTH TWICE A DAY AS NEEDED, Disp: 180 tablet, Rfl: 1   Calcium Citrate-Vitamin D  (CITRACAL + D PO), , Disp: , Rfl:    Multiple Minerals-Vitamins (CAL MAG ZINC +D3 PO), Take 2 tablets by mouth in the morning, at noon, and at bedtime., Disp: , Rfl:    Omega-3 1000 MG CAPS, , Disp: , Rfl:    sertraline  (ZOLOFT ) 100 MG tablet, TAKE 1 TABLET BY MOUTH DAILY, Disp: 90 tablet, Rfl: 1  Physical exam:  Vitals:   12/03/23 0935  BP: 97/69  Pulse: (!) 57  Resp: 18  Temp: (!) 97.1 F (36.2 C)  TempSrc: Tympanic   SpO2: 100%  Weight: 214 lb 8 oz (97.3 kg)  Height: 5\' 4"  (1.626 m)   Physical Exam Cardiovascular:     Rate and Rhythm: Normal rate and regular rhythm.     Heart sounds: Normal heart sounds.  Pulmonary:     Effort: Pulmonary effort is normal.     Breath sounds: Normal breath sounds.  Skin:    General: Skin is warm and dry.  Neurological:     Mental Status: She is alert and oriented to person, place, and time.      I have personally reviewed labs listed below:    Latest Ref Rng & Units 08/03/2023    7:55 AM  CMP  Glucose 70 - 99 mg/dL 85   BUN 6 - 20 mg/dL 15   Creatinine 5.78 - 1.00 mg/dL 4.69   Sodium 629 - 528 mmol/L 137   Potassium  3.5 - 5.1 mmol/L 3.9   Chloride 98 - 111 mmol/L 105   CO2 22 - 32 mmol/L 26   Calcium 8.9 - 10.3 mg/dL 8.6   Total Protein 6.5 - 8.1 g/dL 7.4   Total Bilirubin 0.0 - 1.2 mg/dL 0.5   Alkaline Phos 38 - 126 U/L 80   AST 15 - 41 U/L 23   ALT 0 - 44 U/L 22       Latest Ref Rng & Units 12/03/2023    9:10 AM  CBC  WBC 4.0 - 10.5 K/uL 5.0   Hemoglobin 12.0 - 15.0 g/dL 08.6   Hematocrit 57.8 - 46.0 % 36.2   Platelets 150 - 400 K/uL 137      Assessment and plan- Patient is a 38 y.o. female here for routine follow-up of iron  deficiency anemia likely secondary to duodenal switch surgery Patient has evidence of mild microcytic anemia with an H&H of 11.3/36.2 with an MCV of 78.9.  Ferritin levels are low at 3 with iron  saturation of 4%.  She last received IV iron  in April 2024.  We will proceed with 5 doses of Venofer  at this time.  Discussed risks and benefits of IV iron  including all but not limited to possible risk of infusion reaction.  Patient understands and agrees to proceed as planned.  She has tolerated Venofer  well in the past without any significant side effects Next   Visit Diagnosis 1. Other iron  deficiency anemia      Dr. Seretha Dance, MD, MPH [facility] at [facility] [phone] 12/03/2023 12:30 PM

## 2023-12-03 NOTE — Progress Notes (Signed)
 Patient reports she's been having headaches and feeling very fatigued. She is also having some issues sleeping.

## 2023-12-06 ENCOUNTER — Inpatient Hospital Stay

## 2023-12-06 VITALS — BP 105/58 | HR 64 | Temp 97.5°F | Resp 18

## 2023-12-06 DIAGNOSIS — D508 Other iron deficiency anemias: Secondary | ICD-10-CM

## 2023-12-06 DIAGNOSIS — D509 Iron deficiency anemia, unspecified: Secondary | ICD-10-CM | POA: Diagnosis not present

## 2023-12-06 MED ORDER — SODIUM CHLORIDE 0.9% FLUSH
10.0000 mL | Freq: Once | INTRAVENOUS | Status: AC | PRN
  Administered 2023-12-06: 10 mL
  Filled 2023-12-06: qty 10

## 2023-12-06 MED ORDER — IRON SUCROSE 20 MG/ML IV SOLN
200.0000 mg | INTRAVENOUS | Status: DC
  Administered 2023-12-06: 200 mg via INTRAVENOUS
  Filled 2023-12-06: qty 10

## 2023-12-11 ENCOUNTER — Inpatient Hospital Stay

## 2023-12-11 VITALS — BP 106/74 | HR 77 | Temp 98.1°F | Resp 18

## 2023-12-11 DIAGNOSIS — D509 Iron deficiency anemia, unspecified: Secondary | ICD-10-CM | POA: Diagnosis not present

## 2023-12-11 DIAGNOSIS — D508 Other iron deficiency anemias: Secondary | ICD-10-CM

## 2023-12-11 MED ORDER — IRON SUCROSE 20 MG/ML IV SOLN
200.0000 mg | INTRAVENOUS | Status: DC
  Administered 2023-12-11: 200 mg via INTRAVENOUS

## 2023-12-11 NOTE — Patient Instructions (Signed)

## 2023-12-13 ENCOUNTER — Inpatient Hospital Stay

## 2023-12-17 ENCOUNTER — Inpatient Hospital Stay

## 2023-12-17 VITALS — BP 107/70 | HR 60 | Temp 97.3°F | Resp 18

## 2023-12-17 DIAGNOSIS — D508 Other iron deficiency anemias: Secondary | ICD-10-CM

## 2023-12-17 DIAGNOSIS — D509 Iron deficiency anemia, unspecified: Secondary | ICD-10-CM | POA: Diagnosis not present

## 2023-12-17 MED ORDER — IRON SUCROSE 20 MG/ML IV SOLN
200.0000 mg | INTRAVENOUS | Status: DC
  Administered 2023-12-17: 200 mg via INTRAVENOUS

## 2023-12-17 NOTE — Patient Instructions (Signed)

## 2023-12-19 ENCOUNTER — Inpatient Hospital Stay

## 2023-12-19 VITALS — BP 116/70 | HR 60 | Temp 97.1°F | Resp 18

## 2023-12-19 DIAGNOSIS — D509 Iron deficiency anemia, unspecified: Secondary | ICD-10-CM | POA: Diagnosis not present

## 2023-12-19 DIAGNOSIS — D508 Other iron deficiency anemias: Secondary | ICD-10-CM

## 2023-12-19 MED ORDER — IRON SUCROSE 20 MG/ML IV SOLN
200.0000 mg | INTRAVENOUS | Status: DC
  Administered 2023-12-19: 200 mg via INTRAVENOUS

## 2023-12-21 ENCOUNTER — Inpatient Hospital Stay

## 2023-12-21 VITALS — BP 127/69 | HR 57 | Temp 98.2°F | Resp 18

## 2023-12-21 DIAGNOSIS — D508 Other iron deficiency anemias: Secondary | ICD-10-CM

## 2023-12-21 DIAGNOSIS — D509 Iron deficiency anemia, unspecified: Secondary | ICD-10-CM | POA: Diagnosis not present

## 2023-12-21 MED ORDER — IRON SUCROSE 20 MG/ML IV SOLN
200.0000 mg | INTRAVENOUS | Status: DC
  Administered 2023-12-21: 200 mg via INTRAVENOUS

## 2023-12-21 NOTE — Patient Instructions (Signed)

## 2023-12-31 ENCOUNTER — Telehealth: Payer: Self-pay | Admitting: Family Medicine

## 2023-12-31 ENCOUNTER — Other Ambulatory Visit: Payer: Self-pay

## 2023-12-31 DIAGNOSIS — F411 Generalized anxiety disorder: Secondary | ICD-10-CM

## 2023-12-31 NOTE — Telephone Encounter (Addendum)
 [facility] faxed refill request for the following medications:   busPIRone  (BUSPAR ) 5 MG tablet  sertraline  (ZOLOFT ) 100 MG tablet   Please advise

## 2023-12-31 NOTE — Telephone Encounter (Signed)
 Converted to REfill request

## 2024-01-03 ENCOUNTER — Telehealth: Payer: Self-pay | Admitting: Family Medicine

## 2024-01-03 NOTE — Telephone Encounter (Signed)
 [name] is requesting refill sertraline  (ZOLOFT ) 100 MG tablet   & busPIRone  (BUSPAR ) 5 MG tablet  Please advise

## 2024-02-05 ENCOUNTER — Ambulatory Visit: Payer: Self-pay | Admitting: Family Medicine

## 2024-02-05 ENCOUNTER — Encounter: Payer: Self-pay | Admitting: Family Medicine

## 2024-02-05 ENCOUNTER — Ambulatory Visit: Admitting: Family Medicine

## 2024-02-05 VITALS — BP 103/62 | HR 64 | Ht 64.25 in | Wt 221.1 lb

## 2024-02-05 DIAGNOSIS — F411 Generalized anxiety disorder: Secondary | ICD-10-CM

## 2024-02-05 DIAGNOSIS — Z3200 Encounter for pregnancy test, result unknown: Secondary | ICD-10-CM

## 2024-02-05 DIAGNOSIS — Z0001 Encounter for general adult medical examination with abnormal findings: Secondary | ICD-10-CM | POA: Diagnosis not present

## 2024-02-05 DIAGNOSIS — D508 Other iron deficiency anemias: Secondary | ICD-10-CM | POA: Diagnosis not present

## 2024-02-05 DIAGNOSIS — F39 Unspecified mood [affective] disorder: Secondary | ICD-10-CM

## 2024-02-05 DIAGNOSIS — Z0000 Encounter for general adult medical examination without abnormal findings: Secondary | ICD-10-CM

## 2024-02-05 LAB — POCT URINE PREGNANCY: Preg Test, Ur: POSITIVE — AB

## 2024-02-05 NOTE — Progress Notes (Signed)
 Complete physical exam  Patient: Danielle Sanchez   DOB: May 13, 1986   38 y.o. Female  MRN: [id number]  Introduced to nurse practitioner role and practice setting.  All questions answered.  Discussed provider/patient relationship and expectations.  Subjective:    Chief Complaint  Patient presents with   Transitions Of Care   Annual Exam    Danielle Sanchez is a 38 y.o. female who presents today for a complete physical exam. She reports consuming a general diet. The patient does not participate in regular exercise at present. She generally feels fairly well. She reports sleeping well. She does have additional problems to discuss today.   Discussed the use of AI scribe software for clinical note transcription with the patient, who gave verbal consent to proceed.  History of Present Illness Danielle Sanchez is a 38 year old female who presents with a positive home pregnancy test.  Early pregnancy - Positive home pregnancy test - Last menstrual period on July 1st - Missed period during recent vacation - History of miscarriage between two previous pregnancies - Two living children, ages two and six years - Cautiously optimistic about current pregnancy  Fatigue - Increased fatigue compared to baseline - Attributes fatigue to current pregnancy  Iron  deficiency anemia - Completed iron  infusion treatment two months ago - Iron  levels did not remain elevated after previous treatments - Anticipates need for earlier follow-up than initially planned - Scheduled to follow up with hematology in September  Medication and supplement use - Current medications include Buspar  as needed and sertraline  daily - Takes omega-3 and a multivitamin - Recently started prenatal vitamins in addition to bariatric vitamins after confirming pregnancy - Uncertain if both vitamin regimens should be continued     Most recent fall risk assessment:    02/05/2024    2:16 PM  Fall Risk   Falls in the past year?  1  Number falls in past yr: 1  Comment 5  Injury with Fall? 0  Risk for fall due to : No Fall Risks     Most recent depression screenings:    02/05/2024    2:17 PM 02/13/2023    9:02 AM  PHQ 2/9 Scores  PHQ - 2 Score 1 0  PHQ- 9 Score 17 9    Vision:Within last year and Dental: No current dental problems and Receives regular dental care  Patient Active Problem List   Diagnosis Date Noted   Mood disorder (HCC) 02/13/2023   Annual physical exam 12/19/2022   Iron  deficiency anemia 09/22/2022   Generalized anxiety disorder 11/30/2021   S/P biliopancreatic diversion with duodenal switch 11/17/2020   Past Medical History:  Diagnosis Date   Allergy    Anxiety    Blood transfusion without reported diagnosis    With c-sections   GERD (gastroesophageal reflux disease)    With pregnancy   Gestational thrombocytopenia (HCC)    Headache    History of bunionectomy    PONV (postoperative nausea and vomiting)    Scopolamine  patch not helpful. TIVA with propofol  on 05/04/21 (with decadron /zofran ) - no PONV   Pregnancy induced hypertension    Seizures (HCC)    Febrile seizures when a child   Spontaneous miscarriage 02/03/2021   Past Surgical History:  Procedure Laterality Date   ANTERIOR CRUCIATE LIGAMENT REPAIR Right    bone spur Left    BUNIONECTOMY Right 05/04/2021   Procedure: BUNIONECTOMY- LAPIDUS-TYPE;  Surgeon: Ashley Soulier, DPM;  Location: [facility];  Service: Podiatry;  Laterality: Right;   CESAREAN SECTION N/A 11/01/2017   Procedure: CESAREAN SECTION;  Surgeon: Ward, Mitzie BROCKS, MD;  Location: [facility];  Service: Obstetrics;  Laterality: N/A;   CESAREAN SECTION N/A 02/18/2022   Procedure: CESAREAN SECTION;  Surgeon: Ozan, Jennifer, DO;  Location: [facility];  Service: Obstetrics;  Laterality: N/A;   COLON SURGERY     DILATION AND CURETTAGE OF UTERUS N/A 02/03/2021   Procedure: DILATATION AND CURETTAGE;  Surgeon: Verdon Keen, MD;  Location: [facility];   Service: Gynecology;  Laterality: N/A;   LAPAROSCOPIC GASTRIC RESTRICTIVE DUODENAL PROCEDURE (DUODENAL SWITCH)  10/2020   MENISCUS REPAIR Right    SMALL INTESTINE SURGERY     WISDOM TOOTH EXTRACTION  2017   Social History   Tobacco Use   Smoking status: Never   Smokeless tobacco: Never  Vaping Use   Vaping status: Never Used  Substance Use Topics   Alcohol use: No   Drug use: No   Allergies  Allergen Reactions   Promethazine Other (See Comments)    Abdominal pain/dizziness/fatigue   Azithromycin Swelling      Patient Care Team: Wellington Curtis LABOR, FNP as PCP - General (Family Medicine) Frutoso Luz, MD as Referring Physician (Allergy) Fredirick Glenys RAMAN, MD as Consulting Physician (Obstetrics and Gynecology) Melanee Annah BROCKS, MD as Consulting Physician (Hematology)   Outpatient Medications Prior to Visit  Medication Sig   busPIRone  (BUSPAR ) 5 MG tablet TAKE 1 TABLET BY MOUTH TWICE A DAY AS NEEDED   Calcium Citrate-Vitamin D  (CITRACAL + D PO)    Multiple Minerals-Vitamins (CAL MAG ZINC +D3 PO) Take 2 tablets by mouth in the morning, at noon, and at bedtime.   Omega-3 1000 MG CAPS    sertraline  (ZOLOFT ) 100 MG tablet TAKE 1 TABLET BY MOUTH DAILY   No facility-administered medications prior to visit.    ROS        Objective:     BP 103/62 (BP Location: Left Arm, Patient Position: Sitting, Cuff Size: Normal)   Pulse 64   Ht 5' 4.25 (1.632 m)   Wt 221 lb 1.6 oz (100.3 kg)   LMP 01/01/2024 (Exact Date)   SpO2 98%   BMI 37.66 kg/m  BP Readings from Last 3 Encounters:  02/05/24 103/62  12/21/23 127/69  12/19/23 116/70   Wt Readings from Last 3 Encounters:  02/05/24 221 lb 1.6 oz (100.3 kg)  12/03/23 214 lb 8 oz (97.3 kg)  04/03/23 203 lb (92.1 kg)      Physical Exam Constitutional:      General: She is not in acute distress.    Appearance: Normal appearance. She is obese. She is not ill-appearing, toxic-appearing or diaphoretic.  HENT:     Head:  Normocephalic.     Right Ear: Tympanic membrane normal.     Left Ear: Tympanic membrane normal.     Nose: Nose normal.     Mouth/Throat:     Mouth: Mucous membranes are moist.     Pharynx: Oropharynx is clear. No oropharyngeal exudate or posterior oropharyngeal erythema.  Eyes:     General: Lids are normal.     Extraocular Movements: Extraocular movements intact.     Right eye: Normal extraocular motion.     Left eye: Normal extraocular motion.     Conjunctiva/sclera: Conjunctivae normal.     Right eye: Right conjunctiva is not injected.     Left eye: Left conjunctiva is not injected.     Pupils: Pupils are equal, round, and  reactive to light.  Neck:     Thyroid : No thyroid  mass, thyromegaly or thyroid  tenderness.     Vascular: No carotid bruit.  Cardiovascular:     Rate and Rhythm: Normal rate and regular rhythm.     Pulses: Normal pulses.          Radial pulses are 2+ on the right side and 2+ on the left side.       Posterior tibial pulses are 2+ on the right side and 2+ on the left side.     Heart sounds: Normal heart sounds, S1 normal and S2 normal. No murmur heard.    No friction rub. No gallop.  Pulmonary:     Effort: Pulmonary effort is normal. No respiratory distress.     Breath sounds: Normal breath sounds. No stridor. No wheezing, rhonchi or rales.  Chest:     Comments: No breast concerns, not assessed Abdominal:     General: Bowel sounds are normal. There is no distension.     Palpations: Abdomen is soft. There is no mass.     Tenderness: There is no abdominal tenderness. There is no right CVA tenderness, left CVA tenderness, guarding or rebound.     Hernia: No hernia is present.  Genitourinary:    Comments: UTD on pap - not assessed Musculoskeletal:        General: No swelling or tenderness. Normal range of motion.     Cervical back: Normal range of motion. No rigidity.  Lymphadenopathy:     Cervical: No cervical adenopathy.     Right cervical: No superficial,  deep or posterior cervical adenopathy.    Left cervical: No superficial, deep or posterior cervical adenopathy.  Skin:    General: Skin is warm and dry.     Capillary Refill: Capillary refill takes less than 2 seconds.     Findings: No bruising or erythema.  Neurological:     General: No focal deficit present.     Mental Status: She is alert and oriented to person, place, and time. Mental status is at baseline.     GCS: GCS eye subscore is 4. GCS verbal subscore is 5. GCS motor subscore is 6.     Cranial Nerves: No cranial nerve deficit.     Sensory: No sensory deficit.     Motor: No weakness, tremor or pronator drift.     Coordination: Romberg sign negative.     Gait: Gait is intact. Gait normal.  Psychiatric:        Attention and Perception: Attention and perception normal.        Mood and Affect: Mood and affect normal.        Speech: Speech normal.        Behavior: Behavior normal. Behavior is cooperative.        Thought Content: Thought content normal.        Cognition and Memory: Cognition and memory normal.        Judgment: Judgment normal.      Results for orders placed or performed in visit on 02/05/24  POCT urine pregnancy  Result Value Ref Range   Preg Test, Ur Positive (A) Negative       Assessment & Plan:    Routine Health Maintenance and Physical Exam  Assessment and Plan Assessment & Plan Confirmed early pregnancy Early pregnancy confirmed at approximately [redacted] weeks gestation. History of miscarriage noted. - Positive urine pregnancy during visit - Order HCG quantitative blood test for confirmation. - Refer  to OB-GYN for prenatal care. - Discuss prenatal vitamins and folic acid  supplementation with OB-GYN.  Iron  deficiency anemia, status post recent iron  infusion Iron  levels did not remain elevated post-infusion. Pregnancy may alter iron  requirements. - Follow up with hematology in September. - Monitor iron  levels, especially in light of pregnancy.  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 Safety of medications during pregnancy discussed. - Consult OB-GYN regarding safety of sertraline  and BuSpar  during pregnancy. - Follow up in 6 months for mood check.  Obesity Weight gain of 15 pounds since last visit due to decreased physical activity. - Encourage regular physical activity within comfort limits given positive urine pregnancy test  Annual Physical Things to do to keep yourself healthy  - Exercise at least 30-45 minutes a day, 3-4 days a week.  - Eat a low-fat diet with lots of fruits and vegetables, up to 7-9 servings per day.  - Seatbelts can save your life. Wear them always.  - Smoke detectors on every level of your home, check batteries every year.  - Eye Doctor - have an eye exam every 1-2 years  - Safe sex - if you may be exposed to STDs, use a condom.  - Alcohol -  If you drink, do it moderately, less than 2 drinks per day.  - Health Care Power of Attorney. Choose someone to speak for you if you are not able.  - Depression is common in our stressful world.If you're feeling down or losing interest in things you normally enjoy, please come in for a visit.  - Violence - If anyone is threatening or hurting you, please call immediately. - Self Breast awareness  Health Maintenance  Topic Date Due   Hepatitis B Vaccine (1 of 3 - 19+ 3-dose series) Never done   HPV Vaccine (1 - 3-dose SCDM series) Never done   COVID-19 Vaccine (4 - 2024-25 season) 03/04/2023   Flu Shot  02/01/2024   Pap with HPV screening  07/30/2025   DTaP/Tdap/Td vaccine (3 - Td or Tdap) 12/01/2031   Hepatitis C Screening  Completed   HIV Screening  Completed   Meningitis B Vaccine  Aged Out    Discussed health benefits of physical activity, and encouraged her to engage in regular exercise appropriate for her age and condition.  Annual physical exam -     Lipid Panel With LDL/HDL Ratio -     TSH -     Basic metabolic  panel with GFR  Possible pregnancy, not confirmed -     POCT urine pregnancy -     Human Chorionic Gonadotropin  (hCG),Quantitative (Serial Monitor)  Other iron  deficiency anemia  Generalized anxiety disorder  Morbid obesity (HCC)  Mood disorder (HCC)    Return in about 6 months (around 08/07/2024) for mood check.   I, Curtis DELENA Boom, FNP, have reviewed all documentation for this visit. The documentation on 02/05/24 for the exam, diagnosis, procedures, and orders are all accurate and complete.   Curtis DELENA Boom, FNP

## 2024-02-05 NOTE — Progress Notes (Deleted)
 New Patient Office Visit  Subjective    Patient ID: Danielle Sanchez, female    DOB: [birth year]  Age: 38 y.o. MRN: [id number]  CC:  Chief Complaint  Patient presents with  . Transitions Of Care  . Annual Exam   HPI  Discussed the use of AI scribe software for clinical note transcription with the patient, who gave verbal consent to proceed.  History of Present Illness Danielle Sanchez is a 38 year old female who presents with a positive home pregnancy test.  Early pregnancy - Positive home pregnancy test - Last menstrual period on July 1st - Missed period during recent vacation - History of miscarriage between two previous pregnancies - Two living children, ages two and six years - Cautiously optimistic about current pregnancy  Fatigue - Increased fatigue compared to baseline - Attributes fatigue to current pregnancy  Iron  deficiency anemia - Completed iron  infusion treatment two months ago - Iron  levels did not remain elevated after previous treatments - Anticipates need for earlier follow-up than initially planned - Scheduled to follow up with hematology in September  Medication and supplement use - Current medications include Buspar  as needed and sertraline  daily - Takes omega-3 and a multivitamin - Recently started prenatal vitamins in addition to bariatric vitamins after confirming pregnancy - Uncertain if both vitamin regimens should be continued   Outpatient Encounter Medications as of 02/05/2024  Medication Sig  . busPIRone  (BUSPAR ) 5 MG tablet TAKE 1 TABLET BY MOUTH TWICE A DAY AS NEEDED  . Calcium Citrate-Vitamin D  (CITRACAL + D PO)   . Multiple Minerals-Vitamins (CAL MAG ZINC +D3 PO) Take 2 tablets by mouth in the morning, at noon, and at bedtime.  . Omega-3 1000 MG CAPS   . sertraline  (ZOLOFT ) 100 MG tablet TAKE 1 TABLET BY MOUTH DAILY   No facility-administered encounter medications on file as of 02/05/2024.    Past Medical History:  Diagnosis Date  .  Allergy   . Anxiety   . Blood transfusion without reported diagnosis    With c-sections  . GERD (gastroesophageal reflux disease)    With pregnancy  . Gestational thrombocytopenia (HCC)   . Headache   . History of bunionectomy   . PONV (postoperative nausea and vomiting)    Scopolamine  patch not helpful. TIVA with propofol  on 05/04/21 (with decadron /zofran ) - no PONV  . Pregnancy induced hypertension   . Seizures (HCC)    Febrile seizures when a child  . Spontaneous miscarriage 02/03/2021    Past Surgical History:  Procedure Laterality Date  . ANTERIOR CRUCIATE LIGAMENT REPAIR Right   . bone spur Left   . BUNIONECTOMY Right 05/04/2021   Procedure: BUNIONECTOMY- LAPIDUS-TYPE;  Surgeon: Ashley Soulier, DPM;  Location: [facility];  Service: Podiatry;  Laterality: Right;  . CESAREAN SECTION N/A 11/01/2017   Procedure: CESAREAN SECTION;  Surgeon: Ward, Mitzie BROCKS, MD;  Location: [facility];  Service: Obstetrics;  Laterality: N/A;  . CESAREAN SECTION N/A 02/18/2022   Procedure: CESAREAN SECTION;  Surgeon: Ozan, Jennifer, DO;  Location: [facility];  Service: Obstetrics;  Laterality: N/A;  . COLON SURGERY    . DILATION AND CURETTAGE OF UTERUS N/A 02/03/2021   Procedure: DILATATION AND CURETTAGE;  Surgeon: Verdon Keen, MD;  Location: [facility];  Service: Gynecology;  Laterality: N/A;  . LAPAROSCOPIC GASTRIC RESTRICTIVE DUODENAL PROCEDURE (DUODENAL SWITCH)  10/2020  . MENISCUS REPAIR Right   . SMALL INTESTINE SURGERY    . WISDOM TOOTH EXTRACTION  2017  Family History  Problem Relation Age of Onset  . Asthma Mother   . Hypotension Mother   . Arthritis Mother   . Asthma Sister   . Stroke Maternal Grandmother   . Diabetes Maternal Grandmother   . Hypertension Maternal Grandmother   . Arthritis Maternal Grandmother   . Cancer Maternal Grandmother   . Asthma Maternal Uncle   . Breast cancer Neg Hx     Social History   Socioeconomic History  . Marital status: Married     Spouse name: Not on file  . Number of children: Not on file  . Years of education: Not on file  . Highest education level: Bachelor's degree (e.g., BA, AB, BS)  Occupational History  . Not on file  Tobacco Use  . Smoking status: Never  . Smokeless tobacco: Never  Vaping Use  . Vaping status: Never Used  Substance and Sexual Activity  . Alcohol use: No  . Drug use: No  . Sexual activity: Yes    Birth control/protection: None  Other Topics Concern  . Not on file  Social History Narrative  . Not on file   Social Drivers of Health   Financial Resource Strain: Medium Risk (02/03/2024)   Overall Financial Resource Strain (CARDIA)   . Difficulty of Paying Living Expenses: Somewhat hard  Food Insecurity: Food Insecurity Present (02/03/2024)   Hunger Vital Sign   . Worried About Programme researcher, broadcasting/film/video in the Last Year: Sometimes true   . Ran Out of Food in the Last Year: Sometimes true  Transportation Needs: No Transportation Needs (02/03/2024)   PRAPARE - Transportation   . Lack of Transportation (Medical): No   . Lack of Transportation (Non-Medical): No  Physical Activity: Sufficiently Active (02/03/2024)   Exercise Vital Sign   . Days of Exercise per Week: 3 days   . Minutes of Exercise per Session: 60 min  Stress: Stress Concern Present (02/03/2024)   [employer] of Occupational Health - Occupational Stress Questionnaire   . Feeling of Stress: Very much  Social Connections: Socially Integrated (02/03/2024)   Social Connection and Isolation Panel   . Frequency of Communication with Friends and Family: More than three times a week   . Frequency of Social Gatherings with Friends and Family: Three times a week   . Attends Religious Services: More than 4 times per year   . Active Member of Clubs or Organizations: Yes   . Attends Banker Meetings: More than 4 times per year   . Marital Status: Married  Catering manager Violence: Not At Risk (09/22/2022)   Humiliation,  Afraid, Rape, and Kick questionnaire   . Fear of Current or Ex-Partner: No   . Emotionally Abused: No   . Physically Abused: No   . Sexually Abused: No    ROS      Objective    BP 103/62 (BP Location: Left Arm, Patient Position: Sitting, Cuff Size: Normal)   Pulse 64   Ht 5' 4.25 (1.632 m)   Wt 221 lb 1.6 oz (100.3 kg)   LMP 01/01/2024 (Exact Date)   SpO2 98%   BMI 37.66 kg/m   Physical Exam  {Labs (Optional):23779}    Assessment & Plan:  Assessment and Plan Assessment & Plan Confirmed early pregnancy Early pregnancy confirmed at approximately [redacted] weeks gestation. History of miscarriage noted. - Order HCG blood test for confirmation. - Refer to OB-GYN for prenatal care. - Discuss prenatal vitamins and folic acid  supplementation with OB-GYN.  Iron  deficiency anemia, status post recent iron  infusion Iron  levels did not remain elevated post-infusion. Pregnancy may alter iron  requirements. - Follow up with hematology in September. - Monitor iron  levels, especially in light of pregnancy.  Depression Managed with sertraline , increased to 100 mg. Safety of medications during pregnancy discussed. - Continue sertraline  100 mg daily. - Consult OB-GYN regarding safety of sertraline  and BuSpar  during pregnancy. - Follow up in 6 months for mood check.  Obesity Weight gain of 15 pounds since last visit due to decreased physical activity. - Encourage regular physical activity within comfort limits.     Possible pregnancy, not confirmed    No follow-ups on file.   Curtis DELENA Boom, FNP

## 2024-02-06 LAB — LIPID PANEL WITH LDL/HDL RATIO
Cholesterol, Total: 98 mg/dL — ABNORMAL LOW (ref 100–199)
HDL: 47 mg/dL (ref >39)
LDL Chol Calc (NIH): 37 mg/dL (ref 0–99)
LDL/HDL Ratio: 0.8 ratio (ref 0.0–3.2)
Triglycerides: 60 mg/dL (ref 0–149)
VLDL Cholesterol Cal: 14 mg/dL (ref 5–40)

## 2024-02-06 LAB — BASIC METABOLIC PANEL WITH GFR
BUN/Creatinine Ratio: 13 (ref 9–23)
BUN: 10 mg/dL (ref 6–20)
CO2: 21 mmol/L (ref 20–29)
Calcium: 9.3 mg/dL (ref 8.7–10.2)
Chloride: 101 mmol/L (ref 96–106)
Creatinine, Ser: 0.8 mg/dL (ref 0.57–1.00)
Glucose: 68 mg/dL — ABNORMAL LOW (ref 70–99)
Potassium: 4.1 mmol/L (ref 3.5–5.2)
Sodium: 136 mmol/L (ref 134–144)
eGFR: 98 mL/min/1.73 (ref >59)

## 2024-02-06 LAB — HUMAN CHORIONIC GONADOTROPIN(HCG),B-SUBUNIT,QUANTITATIVE): HCG, Beta Chain, Quant, S: 7582 m[IU]/mL

## 2024-02-06 LAB — TSH: TSH: 1.92 u[IU]/mL (ref 0.450–4.500)

## 2024-02-08 ENCOUNTER — Encounter: Admitting: Family Medicine

## 2024-02-22 ENCOUNTER — Ambulatory Visit: Payer: Self-pay

## 2024-02-22 ENCOUNTER — Ambulatory Visit
Admission: RE | Admit: 2024-02-22 | Discharge: 2024-02-22 | Disposition: A | Discharge status: Home / Self Care | Attending: Emergency Medicine | Admitting: Emergency Medicine

## 2024-02-22 VITALS — BP 109/74 | HR 87 | Temp 97.3°F | Resp 17

## 2024-02-22 DIAGNOSIS — J029 Acute pharyngitis, unspecified: Secondary | ICD-10-CM

## 2024-02-22 DIAGNOSIS — Z3A01 Less than 8 weeks gestation of pregnancy: Secondary | ICD-10-CM | POA: Diagnosis not present

## 2024-02-22 LAB — POC SOFIA SARS ANTIGEN FIA: SARS Coronavirus 2 Ag: NEGATIVE

## 2024-02-22 LAB — POCT RAPID STREP A (OFFICE): Rapid Strep A Screen: NEGATIVE

## 2024-02-22 MED ORDER — AMOXICILLIN 500 MG PO CAPS: 500.0000 mg | ORAL_CAPSULE | Freq: Two times a day (BID) | ORAL | 0 refills | Status: AC

## 2024-02-22 NOTE — Discharge Instructions (Addendum)
 The COVID and strep tests are negative.   Take Tylenol  as needed for fever or discomfort.      If your symptoms are not improving and in 2 days, start the amoxicillin .  Follow-up with your primary care provider or OB/GYN if your symptoms are not improving.

## 2024-02-22 NOTE — ED Provider Notes (Signed)
 Danielle Sanchez    CSN: [id number] Arrival date & time: 02/22/24  1337      History   Chief Complaint Chief Complaint  Patient presents with   Sore Throat    Pain all around neck and very painful to swallow - Entered by patient    HPI Danielle Sanchez is a 38 y.o. female.  Patient is [redacted] weeks pregnant.  She presents with 1 week history of sore throat.  No OTC medications taken today.  No fever, cough, shortness of breath, vomiting, diarrhea.  Patient's medical history includes strep pharyngitis in January 2025.  The history is provided by the patient and medical records.    Past Medical History:  Diagnosis Date   Allergy    Anxiety    Blood transfusion without reported diagnosis    With c-sections   GERD (gastroesophageal reflux disease)    With pregnancy   Gestational thrombocytopenia (HCC)    Headache    History of bunionectomy    PONV (postoperative nausea and vomiting)    Scopolamine  patch not helpful. 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    Past Surgical History:  Procedure Laterality Date   ANTERIOR CRUCIATE LIGAMENT REPAIR Right    bone spur Left    BUNIONECTOMY Right 05/04/2021   Procedure: BUNIONECTOMY- LAPIDUS-TYPE;  Surgeon: Ashley Soulier, DPM;  Location: [facility];  Service: Podiatry;  Laterality: Right;   CESAREAN SECTION N/A 11/01/2017   Procedure: CESAREAN SECTION;  Surgeon: Ward, Mitzie BROCKS, MD;  Location: [facility];  Service: Obstetrics;  Laterality: N/A;   CESAREAN SECTION N/A 02/18/2022   Procedure: CESAREAN SECTION;   Surgeon: Ozan, Jennifer, DO;  Location: [facility];  Service: Obstetrics;  Laterality: N/A;   COLON SURGERY     DILATION AND CURETTAGE OF UTERUS N/A 02/03/2021   Procedure: DILATATION AND CURETTAGE;  Surgeon: Verdon Keen, MD;  Location: [facility];  Service: Gynecology;  Laterality: N/A;   LAPAROSCOPIC GASTRIC RESTRICTIVE DUODENAL PROCEDURE (DUODENAL SWITCH)  10/2020   MENISCUS REPAIR Right    SMALL INTESTINE SURGERY     WISDOM TOOTH EXTRACTION  2017    OB History     Gravida  4   Para  2   Term  2   Preterm      AB  1   Living  2      SAB  1   IAB      Ectopic      Multiple  0   Live Births  2            Home Medications    Prior to Admission medications   Medication Sig Start Date End Date Taking? Authorizing Provider  amoxicillin  (AMOXIL ) 500 MG capsule Take 1 capsule (500 mg total) by mouth 2 (two) times daily for 10 days. 02/22/24 03/03/24 Yes Corlis Burnard DEL, NP  sertraline  (ZOLOFT ) 100 MG tablet TAKE 1 TABLET BY MOUTH DAILY 06/19/23  Yes Emilio Marseille T, FNP  busPIRone  (BUSPAR ) 5 MG tablet TAKE 1 TABLET BY MOUTH TWICE A DAY AS NEEDED 06/19/23  Emilio Aradhya Shellenbarger DASEN, FNP  Calcium Citrate-Vitamin D  (CITRACAL + D PO)  11/10/21   [provider]  Multiple Minerals-Vitamins (CAL MAG ZINC +D3 PO) Take 2 tablets by mouth in the morning, at noon, and at bedtime.    [provider]  Omega-3 1000 MG CAPS  11/10/21   [provider]    Family History Family History  Problem Relation Age of Onset   Asthma Mother    Hypotension Mother    Arthritis Mother    Asthma Sister    Stroke Maternal Grandmother    Diabetes Maternal Grandmother    Hypertension Maternal Grandmother    Arthritis Maternal Grandmother    Cancer Maternal Grandmother    Asthma Maternal Uncle    Breast cancer Neg Hx     Social History Social History   Tobacco Use   Smoking status: Never   Smokeless tobacco: Never  Vaping Use   Vaping status: Never Used  Substance  Use Topics   Alcohol use: No   Drug use: No     Allergies   Promethazine and Azithromycin   Review of Systems Review of Systems  Constitutional:  Negative for chills and fever.  HENT:  Positive for ear pain and sore throat.   Respiratory:  Negative for cough and shortness of breath.      Physical Exam Triage Vital Signs ED Triage Vitals  Encounter Vitals Group     BP 02/22/24 1356 109/74     Girls Systolic BP Percentile --      Girls Diastolic BP Percentile --      Boys Systolic BP Percentile --      Boys Diastolic BP Percentile --      Pulse Rate 02/22/24 1356 87     Resp 02/22/24 1356 17     Temp 02/22/24 1356 (!) 97.3 F (36.3 C)     Temp Source 02/22/24 1356 Oral     SpO2 02/22/24 1356 95 %     Weight --      Height --      Head Circumference --      Peak Flow --      Pain Score 02/22/24 1354 2     Pain Loc --      Pain Education --      Exclude from Growth Chart --    No data found.  Updated Vital Signs BP 109/74 (BP Location: Right Arm)   Pulse 87   Temp (!) 97.3 F (36.3 C) (Oral)   Resp 17   LMP 01/01/2024 (Exact Date)   SpO2 95%   Breastfeeding No   Visual Acuity Right Eye Distance:   Left Eye Distance:   Bilateral Distance:    Right Eye Near:   Left Eye Near:    Bilateral Near:     Physical Exam Constitutional:      General: She is not in acute distress. HENT:     Right Ear: Tympanic membrane normal.     Left Ear: Tympanic membrane normal.     Nose: Nose normal.     Mouth/Throat:     Mouth: Mucous membranes are moist.     Pharynx: Posterior oropharyngeal erythema present.  Cardiovascular:     Rate and Rhythm: Normal rate and regular rhythm.     Heart sounds: Normal heart sounds.  Pulmonary:     Effort: Pulmonary effort is normal. No respiratory distress.     Breath sounds: Normal breath sounds.  Neurological:  Mental Status: She is alert.      UC Treatments / Results  Labs (all labs ordered are listed, but only  abnormal results are displayed) Labs Reviewed  POCT RAPID STREP A (OFFICE) - Normal  POC SOFIA SARS ANTIGEN FIA - Normal    EKG   Radiology No results found.  Procedures Procedures (including critical care time)  Medications Ordered in UC Medications - No data to display  Initial Impression / Assessment and Plan / UC Course  I have reviewed the triage vital signs and the nursing notes.  Pertinent labs & imaging results that were available during my care of the patient were reviewed by me and considered in my medical decision making (see chart for details).    Acute pharyngitis, [redacted] weeks pregnant.   Rapid COVID and strep negative.  Based on patient's history of strep, length of her current symptoms, and exam today, treating with amoxicillin .  Tylenol  as needed.  Education provided on sore throat.  Education also provided on first trimester pregnancy.  Instructed patient to follow-up with her PCP or OB/GYN if she is not improving.  She agrees to plan of care.  Final Clinical Impressions(s) / UC Diagnoses   Final diagnoses:  Sore throat  Less than [redacted] weeks gestation of pregnancy  Acute pharyngitis, unspecified etiology     Discharge Instructions      The COVID and strep tests are negative.   Take Tylenol  as needed for fever or discomfort.      If your symptoms are not improving and in 2 days, start the amoxicillin .  Follow-up with your primary care provider or OB/GYN if your symptoms are not improving.         ED Prescriptions     Medication Sig Dispense Auth. Provider   amoxicillin  (AMOXIL ) 500 MG capsule Take 1 capsule (500 mg total) by mouth 2 (two) times daily for 10 days. 20 capsule Corlis Burnard DEL, NP      PDMP not reviewed this encounter.   Corlis Burnard DEL, NP 02/22/24 [phone]

## 2024-02-22 NOTE — ED Triage Notes (Signed)
 Sx x 1 week  Sore throat Thick mucus in throat.

## 2024-02-26 ENCOUNTER — Ambulatory Visit: Admitting: *Deleted

## 2024-02-26 ENCOUNTER — Other Ambulatory Visit (INDEPENDENT_AMBULATORY_CARE_PROVIDER_SITE_OTHER)

## 2024-02-26 VITALS — BP 122/75 | HR 75 | Wt 228.0 lb

## 2024-02-26 DIAGNOSIS — Z3481 Encounter for supervision of other normal pregnancy, first trimester: Secondary | ICD-10-CM | POA: Diagnosis not present

## 2024-02-26 DIAGNOSIS — Z3A08 8 weeks gestation of pregnancy: Secondary | ICD-10-CM | POA: Diagnosis not present

## 2024-02-26 DIAGNOSIS — O0990 Supervision of high risk pregnancy, unspecified, unspecified trimester: Secondary | ICD-10-CM | POA: Insufficient documentation

## 2024-02-26 DIAGNOSIS — O34219 Maternal care for unspecified type scar from previous cesarean delivery: Secondary | ICD-10-CM | POA: Insufficient documentation

## 2024-02-26 DIAGNOSIS — O3680X0 Pregnancy with inconclusive fetal viability, not applicable or unspecified: Secondary | ICD-10-CM

## 2024-02-26 DIAGNOSIS — Z8759 Personal history of other complications of pregnancy, childbirth and the puerperium: Secondary | ICD-10-CM | POA: Insufficient documentation

## 2024-02-26 NOTE — Addendum Note (Signed)
 Addended by: Ruchel Brandenburger on: 02/26/2024 12:12 PM   Modules accepted: Orders

## 2024-02-26 NOTE — Progress Notes (Signed)
 New OB Intake  I explained I am completing New OB Intake today. We discussed EDD of 10/07/2024, by Last Menstrual Period. Pt is [id number]. I reviewed her allergies, medications and Medical/Surgical/OB history.    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  Patient also informed that the ultrasound is not being completed with the intent of assessing for fetal or placental anomalies or any pelvic abnormalities. Explained that the purpose of today's ultrasound is to assess for viability.  Patient acknowledges the purpose of the exam and the limitations of the study.     Delivery Plans Plans to deliver at [facility]. Discussed the nature of our practice with multiple providers including residents and students. Due to the size of the practice, the delivering provider may not be the same as those providing prenatal care.   MyChart/Babyscripts MyChart access verified. I explained pt will have some visits in office and some virtually. Babyscripts app discussed and ordered.   Blood Pressure Cuff Blood pressure cuff discussed and has one at homeDiscussed to be used for virtual visits and or if needed BP checks weekly.  Anatomy US  Explained first scheduled US  will be around 19 weeks.   Last Pap Diagnosis  Date Value Ref Range Status  07/30/2020   Final   - Negative for intraepithelial lesion or malignancy (NILM)    First visit review I reviewed new OB appt with patient.  Explained pt will be seen by Dr Fredirick at first visit. Discussed Jennell genetic screening with patient declined genetics. Routine prenatal labs to be collected at [facility].    Wanda Buckles, RN 02/26/2024  9:11 AM

## 2024-03-11 ENCOUNTER — Ambulatory Visit (INDEPENDENT_AMBULATORY_CARE_PROVIDER_SITE_OTHER): Admitting: Family Medicine

## 2024-03-11 ENCOUNTER — Other Ambulatory Visit (HOSPITAL_COMMUNITY)
Admission: RE | Admit: 2024-03-11 | Discharge: 2024-03-11 | Disposition: A | Discharge status: Home / Self Care | Source: Ambulatory Visit | Attending: Family Medicine | Admitting: Family Medicine

## 2024-03-11 VITALS — BP 128/74 | HR 54 | Wt 226.6 lb

## 2024-03-11 DIAGNOSIS — O0990 Supervision of high risk pregnancy, unspecified, unspecified trimester: Secondary | ICD-10-CM

## 2024-03-11 DIAGNOSIS — Z9884 Bariatric surgery status: Secondary | ICD-10-CM

## 2024-03-11 DIAGNOSIS — Z124 Encounter for screening for malignant neoplasm of cervix: Secondary | ICD-10-CM | POA: Diagnosis present

## 2024-03-11 DIAGNOSIS — O99341 Other mental disorders complicating pregnancy, first trimester: Secondary | ICD-10-CM | POA: Diagnosis not present

## 2024-03-11 DIAGNOSIS — O34219 Maternal care for unspecified type scar from previous cesarean delivery: Secondary | ICD-10-CM | POA: Diagnosis not present

## 2024-03-11 DIAGNOSIS — Z3A10 10 weeks gestation of pregnancy: Secondary | ICD-10-CM

## 2024-03-11 DIAGNOSIS — Z23 Encounter for immunization: Secondary | ICD-10-CM | POA: Diagnosis not present

## 2024-03-11 DIAGNOSIS — Z8759 Personal history of other complications of pregnancy, childbirth and the puerperium: Secondary | ICD-10-CM

## 2024-03-11 DIAGNOSIS — F411 Generalized anxiety disorder: Secondary | ICD-10-CM

## 2024-03-11 NOTE — Progress Notes (Signed)
 Subjective:   Danielle Sanchez is a 38 y.o. [id number] at [redacted]w[redacted]d by LMP, early ultrasound being seen today for her first obstetrical visit.  Her obstetrical history is significant for advanced maternal age and previous c-section x 2.  Pregnancy history fully reviewed.  Patient reports fatigue and nausea.  HISTORY: OB History  Gravida Para Term Preterm AB Living  4 2 2  0 1 2  SAB IAB Ectopic Multiple Live Births  1 0 0 0 2    # Outcome Date GA Lbr Len/2nd Weight Sex Type Anes PTL Lv  4 Current           3 Term 02/18/22 [redacted]w[redacted]d  7 lb 5.5 oz (3.33 kg) M CS-LTranv EPI  LIV     Name: Gartland,BOY Amayrany     Apgar1: 5  Apgar5: 7  2 SAB 01/2021          1 Term 11/01/17 [redacted]w[redacted]d  6 lb 15.5 oz (3.16 kg) M CS-LTranv EPI  LIV     Name: Neyra,BOY Kynsli     Apgar1: 6  Apgar5: 8    Past Medical History:  Diagnosis Date   Allergy    Anxiety    Blood transfusion without reported diagnosis    With c-sections   GERD (gastroesophageal reflux disease)    With pregnancy   Gestational thrombocytopenia (HCC)    Headache    History of bunionectomy    PONV (postoperative nausea and vomiting)    Scopolamine  patch not helpful. TIVA with propofol  on 05/04/21 (with decadron /zofran ) - no PONV   Pregnancy induced hypertension    Seizures (HCC)    Febrile seizures when a child   Spontaneous miscarriage 02/03/2021   Past Surgical History:  Procedure Laterality Date   ANTERIOR CRUCIATE LIGAMENT REPAIR Right    bone spur Left    BUNIONECTOMY Right 05/04/2021   Procedure: BUNIONECTOMY- LAPIDUS-TYPE;  Surgeon: Ashley Soulier, DPM;  Location: [facility];  Service: Podiatry;  Laterality: Right;   CESAREAN SECTION N/A 11/01/2017   Procedure: CESAREAN SECTION;  Surgeon: Ward, Mitzie BROCKS, MD;  Location: [facility] OR[facility];  Service: Obstetrics;  Laterality: N/A;   CESAREAN SECTION N/A 02/18/2022   Procedure: CESAREAN SECTION;  Surgeon: Ozan, Jennifer, DO;  Location: [facility];  Service: Obstetrics;  Laterality:  N/A;   COLON SURGERY     DILATION AND CURETTAGE OF UTERUS N/A 02/03/2021   Procedure: DILATATION AND CURETTAGE;  Surgeon: Verdon Keen, MD;  Location: [facility] OR[facility];  Service: Gynecology;  Laterality: N/A;   LAPAROSCOPIC GASTRIC RESTRICTIVE DUODENAL PROCEDURE (DUODENAL SWITCH)  10/2020   MENISCUS REPAIR Right    SMALL INTESTINE SURGERY     WISDOM TOOTH EXTRACTION  2017   Family History  Problem Relation Age of Onset   Asthma Mother    Hypotension Mother    Arthritis Mother    Asthma Sister    Stroke Maternal Grandmother    Diabetes Maternal Grandmother    Hypertension Maternal Grandmother    Arthritis Maternal Grandmother    Cancer Maternal Grandmother    Asthma Maternal Uncle    Breast cancer Neg Hx    Social History   Tobacco Use   Smoking status: Never   Smokeless tobacco: Never  Vaping Use   Vaping status: Never Used  Substance Use Topics   Alcohol use: No   Drug use: No   Allergies  Allergen Reactions   Promethazine Other (See Comments)    Abdominal pain/dizziness/fatigue   Azithromycin Swelling  Current Outpatient Medications on File Prior to Visit  Medication Sig Dispense Refill   Calcium Citrate-Vitamin D  (CITRACAL + D PO)      Multiple Minerals-Vitamins (CAL MAG ZINC +D3 PO) Take 2 tablets by mouth in the morning, at noon, and at bedtime.     Omega-3 1000 MG CAPS      Prenatal Vit-Fe Fumarate-FA (MULTIVITAMIN-PRENATAL) 27-0.8 MG TABS tablet Take 1 tablet by mouth daily at 12 noon.     sertraline  (ZOLOFT ) 100 MG tablet TAKE 1 TABLET BY MOUTH DAILY 90 tablet 1   busPIRone  (BUSPAR ) 5 MG tablet TAKE 1 TABLET BY MOUTH TWICE A DAY AS NEEDED (Patient not taking: Reported on 03/11/2024) 180 tablet 1   No current facility-administered medications on file prior to visit.     Exam   Vitals:   03/11/24 1006  BP: 128/74  Pulse: (!) 54  Weight: 226 lb 9.6 oz (102.8 kg)   Fetal Heart Rate (bpm): 168  Uterus:     Pelvic Exam: Perineum: no hemorrhoids, normal  perineum   Vulva: normal external genitalia, no lesions   Vagina:  normal mucosa, normal discharge   Cervix: no lesions and normal, pap smear done.    Adnexa: normal adnexa and no mass, fullness, tenderness   Bony Pelvis: average  System: General: well-developed, well-nourished female in no acute distress   Skin: normal coloration and turgor, no rashes   Neurologic: oriented, normal, negative, normal mood   Extremities: normal strength, tone, and muscle mass, ROM of all joints is normal   HEENT PERRLA, extraocular movement intact and sclera clear, anicteric   Mouth/Teeth mucous membranes moist, pharynx normal without lesions and dental hygiene good   Neck supple and no masses   Cardiovascular: regular rate and rhythm   Respiratory:  no respiratory distress, normal breath sounds   Abdomen: soft, non-tender; bowel sounds normal; no masses,  no organomegaly     Assessment:   Pregnancy: [id number] Patient Active Problem List   Diagnosis Date Noted   Previous cesarean delivery affecting pregnancy, antepartum 02/26/2024   Supervision of high risk pregnancy, antepartum 02/26/2024   History of gestational hypertension 02/26/2024   Mood disorder (HCC) 02/13/2023   Iron  deficiency anemia 09/22/2022   Generalized anxiety disorder 11/30/2021   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 [redacted] weeks gestation of pregnancy (Primary) - Flu vaccine trivalent PF, 6mos and older(Flulaval,Afluria,Fluarix,Fluzone)  2. Supervision of high risk pregnancy, antepartum New OB labs and genetics - Culture, OB Urine - CBC/D/Plt+RPR+Rh+ABO+RubIgG... - PANORAMA PRENATAL TEST - B12 and Folate Panel - Hemoglobin A1c  3. Previous cesarean delivery affecting pregnancy, antepartum X2-->for elective RCS  4. History of gestational hypertension Baseline labs - Comprehensive metabolic panel with GFR - Protein / creatinine ratio, urine  5. Generalized anxiety disorder On Zoloft   and buspar  prn  6. S/P biliopancreatic diversion with duodenal switch H/o iron  deficiency - Iron  and TIBC - Ferritin - B12 and Folate Panel  7. Screening for cervical cancer Check pap  - Cytology - PAP   Initial labs drawn. Continue prenatal vitamins. Genetic Screening discussed, NIPS: ordered. Ultrasound discussed; fetal anatomic survey: ordered. Problem list reviewed and updated.  Routine obstetric precautions reviewed. Return in 4 weeks (on 04/08/2024).

## 2024-03-12 ENCOUNTER — Ambulatory Visit: Payer: Self-pay | Admitting: Family Medicine

## 2024-03-12 DIAGNOSIS — O0990 Supervision of high risk pregnancy, unspecified, unspecified trimester: Secondary | ICD-10-CM

## 2024-03-12 LAB — HEMOGLOBIN A1C
Est. average glucose Bld gHb Est-mCnc: 103 mg/dL
Hgb A1c MFr Bld: 5.2 % (ref 4.8–5.6)

## 2024-03-12 LAB — IRON AND TIBC
Iron Saturation: 20 % (ref 15–55)
Iron: 63 ug/dL (ref 27–159)
Total Iron Binding Capacity: 320 ug/dL (ref 250–450)
UIBC: 257 ug/dL (ref 131–425)

## 2024-03-12 LAB — CBC/D/PLT+RPR+RH+ABO+RUBIGG...
Antibody Screen: NEGATIVE
Basophils Absolute: 0 x10E3/uL (ref 0.0–0.2)
Basos: 0 %
EOS (ABSOLUTE): 0.1 x10E3/uL (ref 0.0–0.4)
Eos: 1 %
HCV Ab: NONREACTIVE
HIV Screen 4th Generation wRfx: NONREACTIVE
Hematocrit: 41.5 % (ref 34.0–46.6)
Hemoglobin: 13.3 g/dL (ref 11.1–15.9)
Hepatitis B Surface Ag: NEGATIVE
Immature Grans (Abs): 0 x10E3/uL (ref 0.0–0.1)
Immature Granulocytes: 0 %
Lymphocytes Absolute: 1.4 x10E3/uL (ref 0.7–3.1)
Lymphs: 19 %
MCH: 28.1 pg (ref 26.6–33.0)
MCHC: 32 g/dL (ref 31.5–35.7)
MCV: 88 fL (ref 79–97)
Monocytes Absolute: 0.4 x10E3/uL (ref 0.1–0.9)
Monocytes: 5 %
Neutrophils Absolute: 5.6 x10E3/uL (ref 1.4–7.0)
Neutrophils: 75 %
Platelets: 161 x10E3/uL (ref 150–450)
RBC: 4.74 x10E6/uL (ref 3.77–5.28)
RDW: 15.5 % — ABNORMAL HIGH (ref 11.7–15.4)
RPR Ser Ql: NONREACTIVE
Rh Factor: NEGATIVE
Rubella Antibodies, IGG: 1.2 {index} (ref >0.99)
WBC: 7.4 x10E3/uL (ref 3.4–10.8)

## 2024-03-12 LAB — COMPREHENSIVE METABOLIC PANEL WITH GFR
ALT: 18 IU/L (ref 0–32)
AST: 16 IU/L (ref 0–40)
Albumin: 4.1 g/dL (ref 3.9–4.9)
Alkaline Phosphatase: 67 IU/L (ref 44–121)
BUN/Creatinine Ratio: 13 (ref 9–23)
BUN: 8 mg/dL (ref 6–20)
Bilirubin Total: 0.3 mg/dL (ref 0.0–1.2)
CO2: 19 mmol/L — ABNORMAL LOW (ref 20–29)
Calcium: 8.7 mg/dL (ref 8.7–10.2)
Chloride: 102 mmol/L (ref 96–106)
Creatinine, Ser: 0.64 mg/dL (ref 0.57–1.00)
Globulin, Total: 2.5 g/dL (ref 1.5–4.5)
Glucose: 78 mg/dL (ref 70–99)
Potassium: 3.8 mmol/L (ref 3.5–5.2)
Sodium: 136 mmol/L (ref 134–144)
Total Protein: 6.6 g/dL (ref 6.0–8.5)
eGFR: 117 mL/min/1.73 (ref >59)

## 2024-03-12 LAB — B12 AND FOLATE PANEL
Folate: 19.4 ng/mL (ref >3.0)
Vitamin B-12: 1759 pg/mL — ABNORMAL HIGH (ref 232–1245)

## 2024-03-12 LAB — CYTOLOGY - PAP
Chlamydia: NEGATIVE
Comment: NEGATIVE
Comment: NEGATIVE
Comment: NORMAL
Diagnosis: NEGATIVE
High risk HPV: NEGATIVE
Neisseria Gonorrhea: NEGATIVE

## 2024-03-12 LAB — PROTEIN / CREATININE RATIO, URINE
Creatinine, Urine: 201 mg/dL
Protein, Ur: 14.1 mg/dL
Protein/Creat Ratio: 70 mg/g{creat} (ref 0–200)

## 2024-03-12 LAB — FERRITIN: Ferritin: 57 ng/mL (ref 15–150)

## 2024-03-12 LAB — HCV INTERPRETATION

## 2024-03-13 LAB — CULTURE, OB URINE

## 2024-03-13 LAB — URINE CULTURE, OB REFLEX

## 2024-03-21 LAB — PANORAMA PRENATAL TEST FULL PANEL:PANORAMA TEST PLUS 5 ADDITIONAL MICRODELETIONS: FETAL FRACTION: 7.9

## 2024-04-03 ENCOUNTER — Inpatient Hospital Stay: Attending: Oncology

## 2024-04-03 DIAGNOSIS — D508 Other iron deficiency anemias: Secondary | ICD-10-CM

## 2024-04-03 DIAGNOSIS — D509 Iron deficiency anemia, unspecified: Secondary | ICD-10-CM | POA: Insufficient documentation

## 2024-04-03 LAB — IRON AND TIBC
Iron: 66 ug/dL (ref 28–170)
Saturation Ratios: 19 % (ref 10.4–31.8)
TIBC: 344 ug/dL (ref 250–450)
UIBC: 278 ug/dL

## 2024-04-03 LAB — CBC (CANCER CENTER ONLY)
HCT: 39 % (ref 36.0–46.0)
Hemoglobin: 13.1 g/dL (ref 12.0–15.0)
MCH: 28.5 pg (ref 26.0–34.0)
MCHC: 33.6 g/dL (ref 30.0–36.0)
MCV: 85 fL (ref 80.0–100.0)
Platelet Count: 136 K/uL — ABNORMAL LOW (ref 150–400)
RBC: 4.59 MIL/uL (ref 3.87–5.11)
RDW: 13.6 % (ref 11.5–15.5)
WBC Count: 6.6 K/uL (ref 4.0–10.5)
nRBC: 0 % (ref 0.0–0.2)

## 2024-04-03 LAB — FERRITIN: Ferritin: 25 ng/mL (ref 11–307)

## 2024-04-10 ENCOUNTER — Encounter: Payer: Self-pay | Admitting: Obstetrics & Gynecology

## 2024-04-10 ENCOUNTER — Ambulatory Visit: Admitting: Obstetrics & Gynecology

## 2024-04-10 VITALS — BP 111/71 | HR 63 | Wt 232.0 lb

## 2024-04-10 DIAGNOSIS — O34219 Maternal care for unspecified type scar from previous cesarean delivery: Secondary | ICD-10-CM

## 2024-04-10 DIAGNOSIS — O0990 Supervision of high risk pregnancy, unspecified, unspecified trimester: Secondary | ICD-10-CM

## 2024-04-10 DIAGNOSIS — O219 Vomiting of pregnancy, unspecified: Secondary | ICD-10-CM

## 2024-04-10 DIAGNOSIS — Z3A14 14 weeks gestation of pregnancy: Secondary | ICD-10-CM

## 2024-04-10 DIAGNOSIS — Z8759 Personal history of other complications of pregnancy, childbirth and the puerperium: Secondary | ICD-10-CM | POA: Diagnosis not present

## 2024-04-10 MED ORDER — ONDANSETRON 4 MG PO TBDP: 4.0000 mg | ORAL_TABLET | Freq: Four times a day (QID) | ORAL | 0 refills | Status: DC | PRN

## 2024-04-10 MED ORDER — ASPIRIN 81 MG PO TBEC: 81.0000 mg | DELAYED_RELEASE_TABLET | Freq: Every day | ORAL | 2 refills | Status: DC

## 2024-04-10 NOTE — Progress Notes (Signed)
   PRENATAL VISIT NOTE  Subjective:  Danielle Sanchez is a 38 y.o. [phone] at [redacted]w[redacted]d being seen today for ongoing prenatal care.  She is currently monitored for the following issues for this high-risk pregnancy and has S/P biliopancreatic diversion with duodenal switch; Gestational thrombocytopenia; Rh negative state in antepartum period; Generalized anxiety disorder; Iron  deficiency anemia; Mood disorder; Previous cesarean delivery affecting pregnancy, antepartum; Supervision of high risk pregnancy, antepartum; and History of gestational hypertension on their problem list.  Patient reports nausea, vomiting, and round ligament pain.  Contractions: Not present. Vag. Bleeding: None.  Movement: Absent. Denies leaking of fluid.   The following portions of the patient's history were reviewed and updated as appropriate: allergies, current medications, past family history, past medical history, past social history, past surgical history and problem list.   Objective:    Vitals:   04/10/24 0844  BP: 111/71  Pulse: 63  Weight: 232 lb (105.2 kg)    Fetal Status:  Fetal Heart Rate (bpm): 150   Movement: Absent    General: Alert, oriented and cooperative. Patient is in no acute distress.  Skin: Skin is warm and dry. No rash noted.   Cardiovascular: Normal heart rate noted  Respiratory: Normal respiratory effort, no problems with respiration noted  Abdomen: Soft, gravid, appropriate for gestational age.  Pain/Pressure: Present     Pelvic: Cervical exam deferred        Extremities: Normal range of motion.  Edema: None  Mental Status: Normal mood and affect. Normal behavior. Normal judgment and thought content.   Assessment and Plan:  Pregnancy: [id number] at [redacted]w[redacted]d 1. Previous cesarean delivery affecting pregnancy, antepartum (Primary) C/S x 2, will decide on mode of delivery later.  2. History of gestational hypertension Stable BP, ASA not prescribed for patient given her previous GI surgery.  3.  Nausea and vomiting during pregnancy Zofran  prescribed as needed. - ondansetron  (ZOFRAN -ODT) 4 MG disintegrating tablet; Take 1 tablet (4 mg total) by mouth every 6 (six) hours as needed for nausea.  Dispense: 20 tablet; Refill: 0  4. [redacted] weeks gestation of pregnancy 5. Supervision of high risk pregnancy, antepartum Second trimester expectations reviewed and all questions answered. No other complaints or concerns.  Routine obstetric precautions reviewed.  Please refer to After Visit Summary for other counseling recommendations.   Return in about 4 weeks (around 05/08/2024) for OFFICE OB VISIT (MD or APP).  Future Appointments  Date Time Provider Department Center  05/08/2024  8:35 AM Fredirick Glenys RAMAN, MD [facility]  05/21/2024  9:45 AM [facility] PROVIDER 1 [facility] None  05/21/2024 10:00 AM [facility]  08/04/2024  9:15 AM [facility] None  08/04/2024  9:30 AM Melanee Annah BROCKS, MD [facility] None  08/08/2024  8:40 AM Ostwalt, Janna, PA-C [facility] Gloris Hugger, MD

## 2024-05-08 ENCOUNTER — Ambulatory Visit (INDEPENDENT_AMBULATORY_CARE_PROVIDER_SITE_OTHER): Admitting: Family Medicine

## 2024-05-08 VITALS — BP 93/64 | HR 65 | Wt 233.4 lb

## 2024-05-08 DIAGNOSIS — F411 Generalized anxiety disorder: Secondary | ICD-10-CM | POA: Diagnosis not present

## 2024-05-08 DIAGNOSIS — Z6791 Unspecified blood type, Rh negative: Secondary | ICD-10-CM

## 2024-05-08 DIAGNOSIS — O99113 Other diseases of the blood and blood-forming organs and certain disorders involving the immune mechanism complicating pregnancy, third trimester: Secondary | ICD-10-CM | POA: Diagnosis not present

## 2024-05-08 DIAGNOSIS — D696 Thrombocytopenia, unspecified: Secondary | ICD-10-CM

## 2024-05-08 DIAGNOSIS — Z3A18 18 weeks gestation of pregnancy: Secondary | ICD-10-CM

## 2024-05-08 DIAGNOSIS — Z9884 Bariatric surgery status: Secondary | ICD-10-CM

## 2024-05-08 DIAGNOSIS — O34219 Maternal care for unspecified type scar from previous cesarean delivery: Secondary | ICD-10-CM

## 2024-05-08 DIAGNOSIS — O26899 Other specified pregnancy related conditions, unspecified trimester: Secondary | ICD-10-CM

## 2024-05-08 DIAGNOSIS — O0990 Supervision of high risk pregnancy, unspecified, unspecified trimester: Secondary | ICD-10-CM

## 2024-05-08 DIAGNOSIS — F39 Unspecified mood [affective] disorder: Secondary | ICD-10-CM

## 2024-05-08 DIAGNOSIS — Z8759 Personal history of other complications of pregnancy, childbirth and the puerperium: Secondary | ICD-10-CM

## 2024-05-08 NOTE — Patient Instructions (Signed)
 For cold and allergy symptoms  You may take Mucinex, (Allegra, Claritin, Zyrtec, Xyzal, Clarinex-any one of these--they are the same class--same as Benadryl), Sudafed (must be behind the counter, should not be phenylephrine), saline or steroid (Nasacort, Nasonex and Flonase) nasal sprays.

## 2024-05-09 NOTE — Progress Notes (Signed)
 PRENATAL VISIT NOTE  Subjective:  Danielle Sanchez is a 38 y.o. [phone] at [redacted]w[redacted]d being seen today for ongoing prenatal care.  She is currently monitored for the following issues for this high-risk pregnancy and has S/P biliopancreatic diversion with duodenal switch; Gestational thrombocytopenia; Rh negative state in antepartum period; Generalized anxiety disorder; Iron  deficiency anemia; Mood disorder; Previous cesarean delivery affecting pregnancy, antepartum; Supervision of high risk pregnancy, antepartum; and History of gestational hypertension on their problem list.  Patient reports no complaints.  Contractions: Not present. Vag. Bleeding: None.   . Denies leaking of fluid.   The following portions of the patient's history were reviewed and updated as appropriate: allergies, current medications, past family history, past medical history, past social history, past surgical history and problem list.   Objective:   Vitals:   05/08/24 0834  BP: 93/64  Pulse: 65  Weight: 233 lb 6.4 oz (105.9 kg)    Fetal Status:  Fetal Heart Rate (bpm): 152        General: Alert, oriented and cooperative. Patient is in no acute distress.  Skin: Skin is warm and dry. No rash noted.   Cardiovascular: Normal heart rate noted  Respiratory: Normal respiratory effort, no problems with respiration noted  Abdomen: Soft, gravid, appropriate for gestational age.        Pelvic: Cervical exam deferred        Extremities: Normal range of motion.  Edema: None  Mental Status: Normal mood and affect. Normal behavior. Normal judgment and thought content.      03/11/2024   10:19 AM 02/05/2024    2:17 PM 02/13/2023    9:02 AM  Depression screen PHQ 2/9  Decreased Interest 0 0 0  Down, Depressed, Hopeless 1 1 0  PHQ - 2 Score 1 1 0  Altered sleeping 1 2 3   Tired, decreased energy 2 3 2   Change in appetite 0 3 1  Feeling bad or failure about yourself  0 2 0  Trouble concentrating 0 3 1  Moving slowly or  fidgety/restless 0 3 2  Suicidal thoughts 0 0 0  PHQ-9 Score 4  17  9    Difficult doing work/chores  Somewhat difficult Somewhat difficult     Data saved with a previous flowsheet row definition        03/11/2024   10:20 AM 02/05/2024    2:17 PM 02/13/2023    9:04 AM 12/19/2022    8:59 AM  GAD 7 : Generalized Anxiety Score  Nervous, Anxious, on Edge 0 1 1 3   Control/stop worrying 0 1 1 3   Worry too much - different things 0 1 1 3   Trouble relaxing 1 1 2 3   Restless 0 1 1 3   Easily annoyed or irritable 1 2 0 2  Afraid - awful might happen 0 1 0 2  Total GAD 7 Score 2 8 6 19   Anxiety Difficulty  Somewhat difficult Not difficult at all Somewhat difficult    Assessment and Plan:  Pregnancy: [id number] at [redacted]w[redacted]d 1. Benign gestational thrombocytopenia in third trimester (Primary) Stable platelet count  2. Supervision of high risk pregnancy, antepartum AFP today Continue prenatal care. Scheduled for anatomy u/s - AFP, Serum, Open Spina Bifida  3. Rh negative state in antepartum period Rhogam @ 28 weeks and pp if indicated   4. Generalized anxiety disorder On Buspar  and Zoloft   5. History of gestational hypertension Cannot take ASA due to GI surger  6. S/P biliopancreatic diversion with duodenal  switch   7. Previous cesarean delivery affecting pregnancy, antepartum X 2 for RCS  8. Mood disorder On Zoloft   9. [redacted] weeks gestation of pregnancy   General obstetric precautions including but not limited to vaginal bleeding, contractions, leaking of fluid and fetal movement were reviewed in detail with the patient. Please refer to After Visit Summary for other counseling recommendations.   Return in 4 weeks (on 06/05/2024).  Future Appointments  Date Time Provider Department Center  05/21/2024  9:45 AM [facility] PROVIDER 1 [facility] None  05/21/2024 10:00 AM [facility] US[facility]  06/05/2024  8:35 AM Izell Harari, MD [facility]  08/04/2024  9:15 AM  [facility] None  08/04/2024  9:30 AM Melanee Annah BROCKS, MD [facility] None  08/08/2024  8:40 AM Dineen Channel, PA-C [facility] Michaela Glenys GORMAN Fredirick, MD

## 2024-05-10 ENCOUNTER — Ambulatory Visit: Payer: Self-pay | Admitting: Family Medicine

## 2024-05-10 DIAGNOSIS — O0990 Supervision of high risk pregnancy, unspecified, unspecified trimester: Secondary | ICD-10-CM

## 2024-05-10 LAB — AFP, SERUM, OPEN SPINA BIFIDA
AFP MoM: 0.84
AFP Value: 28.4 ng/mL
Gest. Age on Collection Date: 18.2 wk
Maternal Age At EDD: 36.6 a
OSBR Risk 1 IN: 10000
Test Results:: NEGATIVE
Weight: 233 [lb_av]

## 2024-05-21 ENCOUNTER — Ambulatory Visit

## 2024-05-21 ENCOUNTER — Ambulatory Visit: Attending: Maternal & Fetal Medicine

## 2024-05-21 ENCOUNTER — Ambulatory Visit: Payer: Self-pay | Admitting: Family Medicine

## 2024-05-21 VITALS — BP 110/58 | HR 69 | Temp 97.5°F

## 2024-05-21 DIAGNOSIS — O99212 Obesity complicating pregnancy, second trimester: Secondary | ICD-10-CM | POA: Insufficient documentation

## 2024-05-21 DIAGNOSIS — O0990 Supervision of high risk pregnancy, unspecified, unspecified trimester: Secondary | ICD-10-CM | POA: Diagnosis present

## 2024-05-21 DIAGNOSIS — D696 Thrombocytopenia, unspecified: Secondary | ICD-10-CM

## 2024-05-21 DIAGNOSIS — O2692 Pregnancy related conditions, unspecified, second trimester: Secondary | ICD-10-CM | POA: Insufficient documentation

## 2024-05-21 DIAGNOSIS — Z8759 Personal history of other complications of pregnancy, childbirth and the puerperium: Secondary | ICD-10-CM

## 2024-05-21 DIAGNOSIS — Z3A20 20 weeks gestation of pregnancy: Secondary | ICD-10-CM | POA: Insufficient documentation

## 2024-05-21 DIAGNOSIS — Z363 Encounter for antenatal screening for malformations: Secondary | ICD-10-CM | POA: Diagnosis not present

## 2024-05-21 DIAGNOSIS — O34219 Maternal care for unspecified type scar from previous cesarean delivery: Secondary | ICD-10-CM | POA: Insufficient documentation

## 2024-05-21 DIAGNOSIS — O09292 Supervision of pregnancy with other poor reproductive or obstetric history, second trimester: Secondary | ICD-10-CM | POA: Diagnosis not present

## 2024-05-21 DIAGNOSIS — O99112 Other diseases of the blood and blood-forming organs and certain disorders involving the immune mechanism complicating pregnancy, second trimester: Secondary | ICD-10-CM

## 2024-06-05 ENCOUNTER — Encounter: Payer: Self-pay | Admitting: Oncology

## 2024-06-05 ENCOUNTER — Ambulatory Visit: Admitting: Obstetrics and Gynecology

## 2024-06-05 ENCOUNTER — Encounter: Payer: Self-pay | Admitting: Obstetrics and Gynecology

## 2024-06-05 VITALS — BP 90/65 | HR 77 | Wt 238.8 lb

## 2024-06-05 DIAGNOSIS — O0992 Supervision of high risk pregnancy, unspecified, second trimester: Secondary | ICD-10-CM | POA: Diagnosis not present

## 2024-06-05 DIAGNOSIS — D696 Thrombocytopenia, unspecified: Secondary | ICD-10-CM

## 2024-06-05 DIAGNOSIS — Z3A22 22 weeks gestation of pregnancy: Secondary | ICD-10-CM

## 2024-06-05 DIAGNOSIS — O34219 Maternal care for unspecified type scar from previous cesarean delivery: Secondary | ICD-10-CM

## 2024-06-05 DIAGNOSIS — O0990 Supervision of high risk pregnancy, unspecified, unspecified trimester: Secondary | ICD-10-CM

## 2024-06-05 DIAGNOSIS — Z8759 Personal history of other complications of pregnancy, childbirth and the puerperium: Secondary | ICD-10-CM

## 2024-06-05 DIAGNOSIS — Z9884 Bariatric surgery status: Secondary | ICD-10-CM

## 2024-06-05 DIAGNOSIS — O99112 Other diseases of the blood and blood-forming organs and certain disorders involving the immune mechanism complicating pregnancy, second trimester: Secondary | ICD-10-CM | POA: Diagnosis not present

## 2024-06-05 DIAGNOSIS — O26899 Other specified pregnancy related conditions, unspecified trimester: Secondary | ICD-10-CM

## 2024-06-05 DIAGNOSIS — Z6791 Unspecified blood type, Rh negative: Secondary | ICD-10-CM

## 2024-06-05 LAB — CBC
Hematocrit: 39.9 % (ref 34.0–46.6)
Hemoglobin: 13.1 g/dL (ref 11.1–15.9)
MCH: 30 pg (ref 26.6–33.0)
MCHC: 32.8 g/dL (ref 31.5–35.7)
MCV: 92 fL (ref 79–97)
Platelets: 133 x10E3/uL — ABNORMAL LOW (ref 150–450)
RBC: 4.36 x10E6/uL (ref 3.77–5.28)
RDW: 12.5 % (ref 11.7–15.4)
WBC: 9.6 x10E3/uL (ref 3.4–10.8)

## 2024-06-05 NOTE — Patient Instructions (Signed)
 75gm fresh test =2 hour fasting  50gm fresh test=1 hour, non fasting

## 2024-06-05 NOTE — Progress Notes (Signed)
 PRENATAL VISIT NOTE  Subjective:  Danielle Sanchez is a 38 y.o. [phone] at [redacted]w[redacted]d being seen today for ongoing prenatal care.  She is currently monitored for the following issues for this low-risk pregnancy and has S/P biliopancreatic diversion with duodenal switch; Gestational thrombocytopenia; Rh negative state in antepartum period; Generalized anxiety disorder; Previous cesarean delivery affecting pregnancy, antepartum; Supervision of high risk pregnancy, antepartum; and History of gestational hypertension on their problem list.  Patient reports no complaints.  Contractions: Not present. Vag. Bleeding: None.  Movement: Present. Denies leaking of fluid.   The following portions of the patient's history were reviewed and updated as appropriate: allergies, current medications, past family history, past medical history, past social history, past surgical history and problem list.   Objective:   Vitals:   06/05/24 0848  BP: 90/65  Pulse: 77  Weight: 238 lb 12.8 oz (108.3 kg)    Fetal Status:  Fetal Heart Rate (bpm): 148   Movement: Present    General: Alert, oriented and cooperative. Patient is in no acute distress.  Skin: Skin is warm and dry. No rash noted.   Cardiovascular: Normal heart rate noted  Respiratory: Normal respiratory effort, no problems with respiration noted  Abdomen: Soft, gravid, appropriate for gestational age.  Pain/Pressure: Absent     Pelvic: Cervical exam deferred        Extremities: Normal range of motion.  Edema: None  Mental Status: Normal mood and affect. Normal behavior. Normal judgment and thought content.      03/11/2024   10:19 AM 02/05/2024    2:17 PM 02/13/2023    9:02 AM  Depression screen PHQ 2/9  Decreased Interest 0 0 0  Down, Depressed, Hopeless 1 1 0  PHQ - 2 Score 1 1 0  Altered sleeping 1 2 3   Tired, decreased energy 2 3 2   Change in appetite 0 3 1  Feeling bad or failure about yourself  0 2 0  Trouble concentrating 0 3 1  Moving slowly  or fidgety/restless 0 3 2  Suicidal thoughts 0 0 0  PHQ-9 Score 4  17  9    Difficult doing work/chores  Somewhat difficult Somewhat difficult     Data saved with a previous flowsheet row definition        03/11/2024   10:20 AM 02/05/2024    2:17 PM 02/13/2023    9:04 AM 12/19/2022    8:59 AM  GAD 7 : Generalized Anxiety Score  Nervous, Anxious, on Edge 0 1 1 3   Control/stop worrying 0 1 1 3   Worry too much - different things 0 1 1 3   Trouble relaxing 1 1 2 3   Restless 0 1 1 3   Easily annoyed or irritable 1 2 0 2  Afraid - awful might happen 0 1 0 2  Total GAD 7 Score 2 8 6 19   Anxiety Difficulty  Somewhat difficult Not difficult at all Somewhat difficult    Assessment and Plan:  Pregnancy: [id number] at [redacted]w[redacted]d 1. [redacted] weeks gestation of pregnancy (Primary) 28wk labs next visit 11/19 mfm anatomy and growth (89%) wnl - CBC  2. Benign gestational thrombocytopenia in third trimester Recheck today    Latest Ref Rng & Units 04/03/2024    8:05 AM 03/11/2024   10:47 AM 12/03/2023    9:10 AM  CBC  WBC 4.0 - 10.5 K/uL 6.6  7.4  5.0   Hemoglobin 12.0 - 15.0 g/dL 86.8  86.6  88.6   Hematocrit 36.0 -  46.0 % 39.0  41.5  36.2   Platelets 150 - 400 K/uL 136  161  137   - CBC  3. S/P biliopancreatic diversion with duodenal switch No NSAIDs or ASA due to ulcer history Follow mfm serial growth scans  4. Rh negative state in antepartum period Rhogam and ab screen at 28wks  5. Supervision of high risk pregnancy, antepartum  6. Previous cesarean delivery affecting pregnancy, antepartum x2  7. History of gestational hypertension  Preterm labor symptoms and general obstetric precautions including but not limited to vaginal bleeding, contractions, leaking of fluid and fetal movement were reviewed in detail with the patient. Please refer to After Visit Summary for other counseling recommendations.   Return in about 1 month (around 07/06/2024) for fasting 2hr GTT, in person, low risk ob, md or  app.  Future Appointments  Date Time Provider Department Center  07/16/2024 11:00 AM [facility]  08/04/2024  9:15 AM [facility] None  08/04/2024  9:30 AM Melanee Annah BROCKS, MD [facility] None  08/08/2024  8:40 AM Dineen Channel, PA-C [facility] Bebe Furry, MD

## 2024-06-06 ENCOUNTER — Ambulatory Visit: Payer: Self-pay | Admitting: Obstetrics and Gynecology

## 2024-07-02 ENCOUNTER — Encounter: Payer: Self-pay | Admitting: Family Medicine

## 2024-07-02 MED ORDER — AMOXICILLIN 500 MG PO CAPS: 500.0000 mg | ORAL_CAPSULE | Freq: Three times a day (TID) | ORAL | 2 refills | Status: DC

## 2024-07-08 ENCOUNTER — Ambulatory Visit: Admitting: Obstetrics & Gynecology

## 2024-07-08 ENCOUNTER — Other Ambulatory Visit

## 2024-07-08 VITALS — BP 94/60 | HR 69 | Wt 237.0 lb

## 2024-07-08 DIAGNOSIS — O26892 Other specified pregnancy related conditions, second trimester: Secondary | ICD-10-CM | POA: Diagnosis not present

## 2024-07-08 DIAGNOSIS — O99213 Obesity complicating pregnancy, third trimester: Secondary | ICD-10-CM

## 2024-07-08 DIAGNOSIS — Z6791 Unspecified blood type, Rh negative: Secondary | ICD-10-CM

## 2024-07-08 DIAGNOSIS — O34219 Maternal care for unspecified type scar from previous cesarean delivery: Secondary | ICD-10-CM

## 2024-07-08 DIAGNOSIS — Z9884 Bariatric surgery status: Secondary | ICD-10-CM

## 2024-07-08 DIAGNOSIS — Z8759 Personal history of other complications of pregnancy, childbirth and the puerperium: Secondary | ICD-10-CM | POA: Diagnosis not present

## 2024-07-08 DIAGNOSIS — O0992 Supervision of high risk pregnancy, unspecified, second trimester: Secondary | ICD-10-CM | POA: Diagnosis not present

## 2024-07-08 DIAGNOSIS — Z3A27 27 weeks gestation of pregnancy: Secondary | ICD-10-CM | POA: Diagnosis not present

## 2024-07-08 DIAGNOSIS — O26899 Other specified pregnancy related conditions, unspecified trimester: Secondary | ICD-10-CM

## 2024-07-08 DIAGNOSIS — O0990 Supervision of high risk pregnancy, unspecified, unspecified trimester: Secondary | ICD-10-CM

## 2024-07-08 DIAGNOSIS — O360921 Maternal care for other rhesus isoimmunization, second trimester, fetus 1: Secondary | ICD-10-CM

## 2024-07-08 DIAGNOSIS — O99112 Other diseases of the blood and blood-forming organs and certain disorders involving the immune mechanism complicating pregnancy, second trimester: Secondary | ICD-10-CM | POA: Diagnosis not present

## 2024-07-08 DIAGNOSIS — Z23 Encounter for immunization: Secondary | ICD-10-CM | POA: Diagnosis not present

## 2024-07-08 DIAGNOSIS — D696 Thrombocytopenia, unspecified: Secondary | ICD-10-CM

## 2024-07-08 DIAGNOSIS — O99210 Obesity complicating pregnancy, unspecified trimester: Secondary | ICD-10-CM

## 2024-07-08 MED ORDER — RHO D IMMUNE GLOBULIN 1500 UNIT/2ML IJ SOSY
300.0000 ug | PREFILLED_SYRINGE | Freq: Once | INTRAMUSCULAR | Status: AC
  Administered 2024-07-08: 300 ug via INTRAMUSCULAR

## 2024-07-08 NOTE — Patient Instructions (Signed)
TDaP Vaccine Pregnancy Get the Whooping Cough Vaccine While You Are Pregnant (CDC)  It is important for women to get the whooping cough vaccine in the third trimester of each pregnancy. Vaccines are the best way to prevent this disease. There are 2 different whooping cough vaccines. Both vaccines combine protection against whooping cough, tetanus and diphtheria, but they are for different age groups: Tdap: for everyone 11 years or older, including pregnant women  DTaP: for children 2 months through 52 years of age  You need the whooping cough vaccine during each of your pregnancies The recommended time to get the shot is during your 27th through 36th week of pregnancy, preferably during the earlier part of this time period. The Centers for Disease Control and Prevention (CDC) recommends that pregnant women receive the whooping cough vaccine for adolescents and adults (called Tdap vaccine) during the third trimester of each pregnancy. The recommended time to get the shot is during your 27th through 36th week of pregnancy, preferably during the earlier part of this time period. This replaces the original recommendation that pregnant women get the vaccine only if they had not previously received it. The [employer] of [employer]stetricians and Gynecologists and the [employer] support this recommendation.  You should get the whooping cough vaccine while pregnant to pass protection to your baby frame support disabled and/or not supported in this browser  Learn why Vernona Rieger decided to get the whooping cough vaccine in her 3rd trimester of pregnancy and how her baby girl was born with some protection against the disease. Also available on YouTube. After receiving the whooping cough vaccine, your body will create protective antibodies (proteins produced by the body to fight off diseases) and pass some of them to your baby before birth. These antibodies provide your baby some short-term  protection against whooping cough in early life. These antibodies can also protect your baby from some of the more serious complications that come along with whooping cough. Your protective antibodies are at their highest about 2 weeks after getting the vaccine, but it takes time to pass them to your baby. So the preferred time to get the whooping cough vaccine is early in your third trimester. The amount of whooping cough antibodies in your body decreases over time. That is why CDC recommends you get a whooping cough vaccine during each pregnancy. Doing so allows each of your babies to get the greatest number of protective antibodies from you. This means each of your babies will get the best protection possible against this disease.  Getting the whooping cough vaccine while pregnant is better than getting the vaccine after you give birth Whooping cough vaccination during pregnancy is ideal so your baby will have short-term protection as soon as he is born. This early protection is important because your baby will not start getting his whooping cough vaccines until he is 2 months old. These first few months of life are when your baby is at greatest risk for catching whooping cough. This is also when he's at greatest risk for having severe, potentially life-threating complications from the infection. To avoid that gap in protection, it is best to get a whooping cough vaccine during pregnancy. You will then pass protection to your baby before he is born. To continue protecting your baby, he should get whooping cough vaccines starting at 2 months old. You may never have gotten the Tdap vaccine before and did not get it during this pregnancy. If so, you should  make sure to get the vaccine immediately after you give birth, before leaving the hospital or birthing center. It will take about 2 weeks before your body develops protection (antibodies) in response to the vaccine. Once you have protection from the vaccine,  you are less likely to give whooping cough to your newborn while caring for him. But remember, your baby will still be at risk for catching whooping cough from others. A recent study looked to see how effective Tdap was at preventing whooping cough in babies whose mothers got the vaccine while pregnant or in the hospital after giving birth. The study found that getting Tdap between 27 through 36 weeks of pregnancy is 85% more effective at preventing whooping cough in babies younger than 2 months old. Blood tests cannot tell if you need a whooping cough vaccine There are no blood tests that can tell you if you have enough antibodies in your body to protect yourself or your baby against whooping cough. Even if you have been sick with whooping cough in the past or previously received the vaccine, you still should get the vaccine during each pregnancy. Breastfeeding may pass some protective antibodies onto your baby By breastfeeding, you may pass some antibodies you have made in response to the vaccine to your baby. When you get a whooping cough vaccine during your pregnancy, you will have antibodies in your breast milk that you can share with your baby as soon as your milk comes in. However, your baby will not get protective antibodies immediately if you wait to get the whooping cough vaccine until after delivering your baby. This is because it takes about 2 weeks for your body to create antibodies. Learn more about the health benefits of breastfeeding.   RSV Vaccination for Pregnant People  CDC recommends two ways to protect babies from getting very sick with Respiratory Syncytial Virus (RSV):  An RSV vaccination given during pregnancy  Pfizer's vaccine Verdis Frederickson) is recommended for use during pregnancy. It is given during RSV season to people who are 32 through [redacted] weeks pregnant.  Or, An RSV immunization given directly to infants and some older babies  Babies born to mothers who get RSV vaccine at  least 2 weeks before delivery will have protection and, in most cases, should not need an RSV immunization later.    When is RSV season?  In most regions of the [place] RSV season starts in the fall and peaks in the winter, but the timing and severity of RSV season can vary from place to place and year to year.   The goal of maternal RSV vaccination is to protect babies from getting very sick with RSV during their first RSV season.  In most of the [place], this means maternal RSV vaccine will be given in September through January.  Who should get the maternal RSV vaccine?  People who are 53 through [redacted] weeks pregnant during September through January should get one dose of maternal RSV vaccine to protect their babies. RSV season can vary around the country.   How is the maternal RSV vaccine administered?  Maternal RSV vaccine is given as a shot into the mother's upper arm. Only a single dose (one shot) of maternal RSV vaccine is recommended.   It is not yet known whether another dose might be needed in later pregnancies.  How well does the maternal RSV vaccine work?  When someone gets RSV vaccine, their body responds by making a protein that protects against  the virus that causes RSV. The process takes about 2 weeks. When a pregnant person gets RSV vaccine, their protective proteins (called antibodies) also pass to their baby. So, babies who are born at least 2 weeks after their mother gets RSV vaccine are protected at birth, when infants are at the highest risk of severe RSV disease.   The vaccine can reduce a baby's risk of being hospitalized from RSV by 57% in the first six months after birth.  What are the possible side effects of the maternal RSV vaccine?  In the clinical trials, the side effects most often reported by pregnant people who received the maternal RSV vaccine were pain at the injection site, headache, muscle pain, and nausea.  Although not  common, a dangerous high blood pressure condition called pre-eclampsia occurred in 1.8% of pregnant people who received the maternal RSV vaccine compared to 1.4% of pregnant people who received a placebo.  The clinical trials identified a small increase in the number of preterm births in vaccinated pregnant people. It is not clear if this is a true safety problem related to RSV vaccine or if this occurred for reasons unrelated to vaccination.  To reduce the potential risk of preterm birth and complications from RSV disease, FDA approved the maternal RSV vaccine for use during weeks 32 through 13 of pregnancy while additional studies are conducted.  FDA is requiring the manufacturer to do additional studies that will look more closely at the potential risk of preterm births and pregnancy-related high blood pressure issues in mothers, including pre-eclampsia.  Severe allergic reactions to vaccines are rare but can happen after any vaccine and can be life-threatening. If you see signs of a severe allergic reaction after vaccination (hives, swelling of the face and throat, difficulty breathing, a fast heartbeat, dizziness, or weakness), seek immediate medical care by calling 911.  As with any medicine or vaccine there is a very remote chance of the vaccine causing other serious injury or death after vaccination.  Adverse events following vaccination should be reported to the Vaccine Adverse Event Reporting System (VAERS), even if it's not clear that the vaccine caused the adverse event. You or your doctor can report an adverse event to [facility] and FDA through VAERS. If you need further assistance reporting to VAERS, please email [email or web address] [email or web address] or call [phone].  If you have any questions about side effects from the maternal RSV vaccine, talk with your healthcare provider.  Do I need a prescription for a maternal RSV vaccine?  Until the vaccine available in the office, you will need a prescription to  take to a local pharmacy that is providing the vaccine.   How do I pay for the maternal RSV vaccine?  Most private health insurance plans cover the maternal RSV vaccine, but there may be a cost to you depending on your plan.  Contact your insurer to find out.  Medicaid Beginning April 02, 2022, most people with coverage from [facility] and [employer] [facility]) will be guaranteed coverage of all vaccines recommended by the Advisory Committee on Immunization Practice at no cost to them.   Source: [facility] for Immunization and Respiratory Diseases

## 2024-07-08 NOTE — Progress Notes (Signed)
 "  PRENATAL VISIT NOTE  Subjective:  Danielle Sanchez is a 39 y.o. [phone] at [redacted]w[redacted]d being seen today for ongoing prenatal care.  She is currently monitored for the following issues for this high-risk pregnancy and has S/P biliopancreatic diversion with duodenal switch; Gestational thrombocytopenia; Rh negative state in antepartum period; Generalized anxiety disorder; Previous cesarean delivery affecting pregnancy, antepartum; Supervision of high risk pregnancy, antepartum; and History of gestational hypertension on their problem list.  Patient reports no complaints.  Contractions: Not present. Vag. Bleeding: None.  Movement: Present. Denies leaking of fluid.   The following portions of the patient's history were reviewed and updated as appropriate: allergies, current medications, past family history, past medical history, past social history, past surgical history and problem list.   Objective:   Vitals:   07/08/24 0840  BP: 94/60  Pulse: 69  Weight: 237 lb (107.5 kg)    Fetal Status:  Fetal Heart Rate (bpm): 135   Movement: Present    General: Alert, oriented and cooperative. Patient is in no acute distress.  Skin: Skin is warm and dry. No rash noted.   Cardiovascular: Normal heart rate noted  Respiratory: Normal respiratory effort, no problems with respiration noted  Abdomen: Soft, gravid, appropriate for gestational age.  Pain/Pressure: Absent     Pelvic: Cervical exam deferred        Extremities: Normal range of motion.     Mental Status: Normal mood and affect. Normal behavior. Normal judgment and thought content.      03/11/2024   10:19 AM 02/05/2024    2:17 PM 02/13/2023    9:02 AM  Depression screen PHQ 2/9  Decreased Interest 0 0 0  Down, Depressed, Hopeless 1 1 0  PHQ - 2 Score 1 1 0  Altered sleeping 1 2 3   Tired, decreased energy 2 3 2   Change in appetite 0 3 1  Feeling bad or failure about yourself  0 2 0  Trouble concentrating 0 3 1  Moving slowly or  fidgety/restless 0 3 2  Suicidal thoughts 0 0 0  PHQ-9 Score 4  17  9    Difficult doing work/chores  Somewhat difficult Somewhat difficult     Data saved with a previous flowsheet row definition        03/11/2024   10:20 AM 02/05/2024    2:17 PM 02/13/2023    9:04 AM 12/19/2022    8:59 AM  GAD 7 : Generalized Anxiety Score  Nervous, Anxious, on Edge 0 1 1 3   Control/stop worrying 0 1 1 3   Worry too much - different things 0 1 1 3   Trouble relaxing 1 1 2 3   Restless 0 1 1 3   Easily annoyed or irritable 1 2 0 2  Afraid - awful might happen 0 1 0 2  Total GAD 7 Score 2 8 6 19   Anxiety Difficulty  Somewhat difficult Not difficult at all Somewhat difficult    Assessment and Plan:  Pregnancy: [id number] at [redacted]w[redacted]d 1. Previous cesarean delivery x 2 affecting pregnancy, antepartum (Primary) Desires RCS, to be scheduled later.  2. History of gestational hypertension Stable BP  3. Benign gestational thrombocytopenia in second trimester    Latest Ref Rng & Units 06/05/2024    9:38 AM 04/03/2024    8:05 AM 03/11/2024   10:47 AM  CBC  WBC 3.4 - 10.8 x10E3/uL 9.6  6.6  7.4   Hemoglobin 11.1 - 15.9 g/dL 86.8  86.8  86.6   Hematocrit 34.0 -  46.6 % 39.9  39.0  41.5   Platelets 150 - 450 x10E3/uL 133  136  161   - CBC checked today, will follow up results and manage accordingly.  4. Obesity in pregnancy, antepartum TWG 9 lbs, doing well.  Fetal growth scan scheduled next week.  5. Rh negative state in antepartum period Rhogam given today. - rho (d) immune globulin  (RHIG/RHOPHYLAC ) injection 300 mcg  6. Need for Tdap vaccination - Tdap vaccine greater than or equal to 7yo IM given today.  7. S/P biliopancreatic diversion with duodenal switch Will avoid ASA, all NSAIDs  8. [redacted] weeks gestation of pregnancy 9. Supervision of high risk pregnancy, antepartum - CBC - Glucose Tolerance, 2 Hours w/1 Hour - HIV Antibody (routine testing w rflx) - RPR W/RFLX TO RPR TITER, TREPONEMAL AB, SCREEN  AND DIAGNOSIS - Antibody screen - Comp Met (CMET) Third trimester expectations reviewed and all questions answered. Preterm labor symptoms and general obstetric precautions including but not limited to vaginal bleeding, contractions, leaking of fluid and fetal movement were reviewed in detail with the patient. Please refer to After Visit Summary for other counseling recommendations.   Return in about 2 weeks (around 07/22/2024) for OFFICE OB VISIT (MD only).  Future Appointments  Date Time Provider Department Center  07/16/2024 11:00 AM [facility] US  1 [facility] [place]  08/04/2024  9:15 AM [facility] None  08/04/2024  9:30 AM Melanee Annah BROCKS, MD [facility] None  08/08/2024  8:40 AM Ostwalt, Janna, PA-C [facility] Gloris Hugger, MD  "

## 2024-07-09 ENCOUNTER — Ambulatory Visit: Payer: Self-pay | Admitting: Obstetrics & Gynecology

## 2024-07-09 LAB — COMPREHENSIVE METABOLIC PANEL WITH GFR
ALT: 17 IU/L (ref 0–32)
AST: 20 IU/L (ref 0–40)
Albumin: 3.4 g/dL — ABNORMAL LOW (ref 3.9–4.9)
Alkaline Phosphatase: 82 IU/L (ref 41–116)
BUN/Creatinine Ratio: 9 (ref 9–23)
BUN: 7 mg/dL (ref 6–20)
Bilirubin Total: 0.2 mg/dL (ref 0.0–1.2)
CO2: 21 mmol/L (ref 20–29)
Calcium: 8.2 mg/dL — ABNORMAL LOW (ref 8.7–10.2)
Chloride: 105 mmol/L (ref 96–106)
Creatinine, Ser: 0.74 mg/dL (ref 0.57–1.00)
Globulin, Total: 2.7 g/dL (ref 1.5–4.5)
Glucose: 70 mg/dL (ref 70–99)
Potassium: 4.1 mmol/L (ref 3.5–5.2)
Sodium: 139 mmol/L (ref 134–144)
Total Protein: 6.1 g/dL (ref 6.0–8.5)
eGFR: 107 mL/min/1.73

## 2024-07-09 LAB — CBC
Hematocrit: 40.2 % (ref 34.0–46.6)
Hemoglobin: 13.1 g/dL (ref 11.1–15.9)
MCH: 29.4 pg (ref 26.6–33.0)
MCHC: 32.6 g/dL (ref 31.5–35.7)
MCV: 90 fL (ref 79–97)
Platelets: 164 x10E3/uL (ref 150–450)
RBC: 4.45 x10E6/uL (ref 3.77–5.28)
RDW: 11.9 % (ref 11.7–15.4)
WBC: 10 x10E3/uL (ref 3.4–10.8)

## 2024-07-09 LAB — SYPHILIS: RPR W/REFLEX TO RPR TITER AND TREPONEMAL ANTIBODIES, TRADITIONAL SCREENING AND DIAGNOSIS ALGORITHM: RPR Ser Ql: NONREACTIVE

## 2024-07-09 LAB — GLUCOSE TOLERANCE, 2 HOURS W/ 1HR
Glucose, 1 hour: 77 mg/dL (ref 70–179)
Glucose, 2 hour: 54 mg/dL — ABNORMAL LOW (ref 70–152)
Glucose, Fasting: 69 mg/dL — ABNORMAL LOW (ref 70–91)

## 2024-07-09 LAB — ANTIBODY SCREEN: Antibody Screen: NEGATIVE

## 2024-07-09 LAB — HIV ANTIBODY (ROUTINE TESTING W REFLEX): HIV Screen 4th Generation wRfx: NONREACTIVE

## 2024-07-16 ENCOUNTER — Other Ambulatory Visit: Payer: Self-pay

## 2024-07-16 ENCOUNTER — Ambulatory Visit: Admitting: Maternal & Fetal Medicine

## 2024-07-16 ENCOUNTER — Ambulatory Visit

## 2024-07-16 VITALS — BP 106/68 | HR 68

## 2024-07-16 DIAGNOSIS — O34219 Maternal care for unspecified type scar from previous cesarean delivery: Secondary | ICD-10-CM

## 2024-07-16 DIAGNOSIS — Z8759 Personal history of other complications of pregnancy, childbirth and the puerperium: Secondary | ICD-10-CM

## 2024-07-16 DIAGNOSIS — D696 Thrombocytopenia, unspecified: Secondary | ICD-10-CM

## 2024-07-16 DIAGNOSIS — O0990 Supervision of high risk pregnancy, unspecified, unspecified trimester: Secondary | ICD-10-CM

## 2024-07-16 NOTE — Progress Notes (Signed)
 After review, MFM consult with provider is not indicated for today  Kizzie Nathanel Pipe, MD 07/16/2024 4:34 PM  [facility]

## 2024-07-21 ENCOUNTER — Ambulatory Visit: Admitting: Obstetrics and Gynecology

## 2024-07-21 VITALS — BP 105/65 | HR 78 | Wt 244.0 lb

## 2024-07-21 DIAGNOSIS — Z6791 Unspecified blood type, Rh negative: Secondary | ICD-10-CM | POA: Diagnosis not present

## 2024-07-21 DIAGNOSIS — O26893 Other specified pregnancy related conditions, third trimester: Secondary | ICD-10-CM

## 2024-07-21 DIAGNOSIS — Z3A28 28 weeks gestation of pregnancy: Secondary | ICD-10-CM

## 2024-07-21 DIAGNOSIS — O0990 Supervision of high risk pregnancy, unspecified, unspecified trimester: Secondary | ICD-10-CM

## 2024-07-21 DIAGNOSIS — Z9884 Bariatric surgery status: Secondary | ICD-10-CM | POA: Diagnosis not present

## 2024-07-21 DIAGNOSIS — O34219 Maternal care for unspecified type scar from previous cesarean delivery: Secondary | ICD-10-CM

## 2024-07-21 DIAGNOSIS — O0993 Supervision of high risk pregnancy, unspecified, third trimester: Secondary | ICD-10-CM

## 2024-07-21 DIAGNOSIS — O26899 Other specified pregnancy related conditions, unspecified trimester: Secondary | ICD-10-CM

## 2024-07-21 DIAGNOSIS — Z8759 Personal history of other complications of pregnancy, childbirth and the puerperium: Secondary | ICD-10-CM

## 2024-07-22 NOTE — Progress Notes (Signed)
 "  PRENATAL VISIT NOTE  Subjective:  Danielle Sanchez is a 39 y.o. [phone] at [redacted]w[redacted]d being seen today for ongoing prenatal care.  She is currently monitored for the following issues for this high-risk pregnancy and has S/P biliopancreatic diversion with duodenal switch; Gestational thrombocytopenia; Rh negative state in antepartum period; Generalized anxiety disorder; Previous cesarean delivery affecting pregnancy, antepartum; Supervision of high risk pregnancy, antepartum; and History of gestational hypertension on their problem list.  Patient reports no complaints.  Contractions: Not present. Vag. Bleeding: None.  Movement: Present. Denies leaking of fluid.   The following portions of the patient's history were reviewed and updated as appropriate: allergies, current medications, past family history, past medical history, past social history, past surgical history and problem list.   Objective:   Vitals:   07/21/24 1616  BP: 105/65  Pulse: 78  Weight: 244 lb (110.7 kg)    Fetal Status:  Fetal Heart Rate (bpm): 136   Movement: Present    General: Alert, oriented and cooperative. Patient is in no acute distress.  Skin: Skin is warm and dry. No rash noted.   Cardiovascular: Normal heart rate noted  Respiratory: Normal respiratory effort, no problems with respiration noted  Abdomen: Soft, gravid, appropriate for gestational age.  Pain/Pressure: Absent     Pelvic: Cervical exam deferred        Extremities: Normal range of motion.     Mental Status: Normal mood and affect. Normal behavior. Normal judgment and thought content.      03/11/2024   10:19 AM 02/05/2024    2:17 PM 02/13/2023    9:02 AM  Depression screen PHQ 2/9  Decreased Interest 0 0 0  Down, Depressed, Hopeless 1 1 0  PHQ - 2 Score 1 1 0  Altered sleeping 1 2 3   Tired, decreased energy 2 3 2   Change in appetite 0 3 1  Feeling bad or failure about yourself  0 2 0  Trouble concentrating 0 3 1  Moving slowly or  fidgety/restless 0 3 2  Suicidal thoughts 0 0 0  PHQ-9 Score 4  17  9    Difficult doing work/chores  Somewhat difficult Somewhat difficult     Data saved with a previous flowsheet row definition        03/11/2024   10:20 AM 02/05/2024    2:17 PM 02/13/2023    9:04 AM 12/19/2022    8:59 AM  GAD 7 : Generalized Anxiety Score  Nervous, Anxious, on Edge 0  1  1  3    Control/stop worrying 0  1  1  3    Worry too much - different things 0  1  1  3    Trouble relaxing 1  1  2  3    Restless 0  1  1  3    Easily annoyed or irritable 1  2  0  2   Afraid - awful might happen 0  1  0  2   Total GAD 7 Score 2 8 6 19   Anxiety Difficulty  Somewhat difficult Not difficult at all Somewhat difficult     Data saved with a previous flowsheet row definition    Assessment and Plan:  Pregnancy: [id number] at [redacted]w[redacted]d 1. S/P biliopancreatic diversion with duodenal switch (Primary) Avoid ASAs 1/14: efw 81%, 1369g, ac 95%, afi 19>>repeat PRN  2. Supervision of high risk pregnancy, antepartum 28wk labs wnl  3. History of gestational thrombocytopenia    Latest Ref Rng & Units 07/08/2024  8:22 AM 06/05/2024    9:38 AM 04/03/2024    8:05 AM  CBC  WBC 3.4 - 10.8 x10E3/uL 10.0  9.6  6.6   Hemoglobin 11.1 - 15.9 g/dL 86.8  86.8  86.8   Hematocrit 34.0 - 46.6 % 40.2  39.9  39.0   Platelets 150 - 450 x10E3/uL 164  133  136    4. Rh negative state in antepartum period S/p rhogam and negative Ab screen  5. Previous cesarean delivery affecting pregnancy, antepartum X2. D/w her re: tolac. Pt thought she had to have another c/s until her last visit. I d/w her re: tolac vs rpt c/s and told her I recommend another c/s but she can technically try and tolac again.   Preterm labor symptoms and general obstetric precautions including but not limited to vaginal bleeding, contractions, leaking of fluid and fetal movement were reviewed in detail with the patient. Please refer to After Visit Summary for other counseling  recommendations.   No follow-ups on file.  Future Appointments  Date Time Provider Department Center  08/04/2024  9:15 AM [facility] None  08/04/2024  9:30 AM Melanee Annah BROCKS, MD [facility] None  08/07/2024  8:35 AM Fredirick Glenys RAMAN, MD [facility]  08/08/2024  8:40 AM Dineen Channel, PA-C [facility] [place]  08/21/2024  8:35 AM Fredirick Glenys RAMAN, MD [facility]  09/03/2024  8:35 AM Emilio Delilah HERO, CNM [facility]    Bebe Furry, MD  "

## 2024-08-01 ENCOUNTER — Telehealth: Payer: Self-pay | Admitting: Oncology

## 2024-08-01 NOTE — Telephone Encounter (Signed)
 Left vm with pt about a delay in opening on Monday 2/2 and needing to reschedule her appointment to the following Monday. Appointment details were left on vm and I asked pt to call back if she had any questions or if this change did not work

## 2024-08-03 NOTE — Progress Notes (Unsigned)
 " Established patient visit  Patient: Danielle Sanchez   DOB: 02/27/1986   39 y.o. Female  MRN: [id number] Visit Date: 08/08/2024  Today's healthcare provider: Jolynn Spencer, PA-C   No chief complaint on file.  Subjective       Discussed the use of AI scribe software for clinical note transcription with the patient, who gave verbal consent to proceed.  History of Present Illness        03/11/2024   10:19 AM 02/05/2024    2:17 PM 02/13/2023    9:02 AM  Depression screen PHQ 2/9  Decreased Interest 0 0 0  Down, Depressed, Hopeless 1 1 0  PHQ - 2 Score 1 1 0  Altered sleeping 1 2 3   Tired, decreased energy 2 3 2   Change in appetite 0 3 1  Feeling bad or failure about yourself  0 2 0  Trouble concentrating 0 3 1  Moving slowly or fidgety/restless 0 3 2  Suicidal thoughts 0 0 0  PHQ-9 Score 4  17  9    Difficult doing work/chores  Somewhat difficult Somewhat difficult     Data saved with a previous flowsheet row definition      03/11/2024   10:20 AM 02/05/2024    2:17 PM 02/13/2023    9:04 AM 12/19/2022    8:59 AM  GAD 7 : Generalized Anxiety Score  Nervous, Anxious, on Edge 0  1  1  3    Control/stop worrying 0  1  1  3    Worry too much - different things 0  1  1  3    Trouble relaxing 1  1  2  3    Restless 0  1  1  3    Easily annoyed or irritable 1  2  0  2   Afraid - awful might happen 0  1  0  2   Total GAD 7 Score 2 8 6 19   Anxiety Difficulty  Somewhat difficult Not difficult at all Somewhat difficult     Data saved with a previous flowsheet row definition    Medications: Show/hide medication list[1]  Review of Systems  All other systems reviewed and are negative.  All negative Except see HPI   {Insert previous labs (optional):23779} {See past labs  Heme  Chem  Endocrine  Serology  Results Review (optional):1}   Objective    LMP 01/01/2024 (Exact Date)  {Insert last BP/Wt (optional):23777}{See vitals history (optional):1}   Physical Exam Vitals  reviewed.  Constitutional:      General: She is not in acute distress.    Appearance: Normal appearance. She is well-developed. She is not diaphoretic.  HENT:     Head: Normocephalic and atraumatic.  Eyes:     General: No scleral icterus.    Conjunctiva/sclera: Conjunctivae normal.  Neck:     Thyroid : No thyromegaly.  Cardiovascular:     Rate and Rhythm: Normal rate and regular rhythm.     Pulses: Normal pulses.     Heart sounds: Normal heart sounds. No murmur heard. Pulmonary:     Effort: Pulmonary effort is normal. No respiratory distress.     Breath sounds: Normal breath sounds. No wheezing, rhonchi or rales.  Musculoskeletal:     Cervical back: Neck supple.     Right lower leg: No edema.     Left lower leg: No edema.  Lymphadenopathy:     Cervical: No cervical adenopathy.  Skin:    General: Skin is warm and dry.  Findings: No rash.  Neurological:     Mental Status: She is alert and oriented to person, place, and time. Mental status is at baseline.  Psychiatric:        Mood and Affect: Mood normal.        Behavior: Behavior normal.     No results found for any visits on 08/08/24.      Assessment and Plan Assessment & Plan     No orders of the defined types were placed in this encounter.   No follow-ups on file.   The patient was advised to call back or seek an in-person evaluation if the symptoms worsen or if the condition fails to improve as anticipated.  I discussed the assessment and treatment plan with the patient. The patient was provided an opportunity to ask questions and all were answered. The patient agreed with the plan and demonstrated an understanding of the instructions.  I, Shree Espey, PA-C have reviewed all documentation for this visit. The documentation on 08/08/2024  for the exam, diagnosis, procedures, and orders are all accurate and complete.  Jolynn Spencer, [facility], MMS [facility] [phone] (phone) [phone]  (fax)  [facility]     [1] Outpatient Medications Prior to Visit  Medication Sig   busPIRone  (BUSPAR ) 5 MG tablet TAKE 1 TABLET BY MOUTH TWICE A DAY AS NEEDED   Calcium Citrate-Vitamin D  (CITRACAL + D PO)    Multiple Minerals-Vitamins (CAL MAG ZINC +D3 PO) Take 2 tablets by mouth in the morning, at noon, and at bedtime.   Omega-3 1000 MG CAPS    ondansetron  (ZOFRAN -ODT) 4 MG disintegrating tablet Take 1 tablet (4 mg total) by mouth every 6 (six) hours as needed for nausea. (Patient not taking: Reported on 06/05/2024)   Prenatal Vit-Fe Fumarate-FA (MULTIVITAMIN-PRENATAL) 27-0.8 MG TABS tablet Take 1 tablet by mouth daily at 12 noon.   sertraline  (ZOLOFT ) 100 MG tablet TAKE 1 TABLET BY MOUTH DAILY   No facility-administered medications prior to visit.  "

## 2024-08-04 ENCOUNTER — Inpatient Hospital Stay

## 2024-08-04 ENCOUNTER — Inpatient Hospital Stay: Admitting: Oncology

## 2024-08-07 ENCOUNTER — Ambulatory Visit: Admitting: Family Medicine

## 2024-08-07 VITALS — BP 108/76 | HR 76 | Wt 240.0 lb

## 2024-08-07 DIAGNOSIS — Z3A31 31 weeks gestation of pregnancy: Secondary | ICD-10-CM

## 2024-08-07 DIAGNOSIS — O34219 Maternal care for unspecified type scar from previous cesarean delivery: Secondary | ICD-10-CM | POA: Diagnosis not present

## 2024-08-07 DIAGNOSIS — O0993 Supervision of high risk pregnancy, unspecified, third trimester: Secondary | ICD-10-CM

## 2024-08-07 DIAGNOSIS — O99343 Other mental disorders complicating pregnancy, third trimester: Secondary | ICD-10-CM | POA: Diagnosis not present

## 2024-08-07 DIAGNOSIS — O26899 Other specified pregnancy related conditions, unspecified trimester: Secondary | ICD-10-CM

## 2024-08-07 DIAGNOSIS — D696 Thrombocytopenia, unspecified: Secondary | ICD-10-CM

## 2024-08-07 DIAGNOSIS — F411 Generalized anxiety disorder: Secondary | ICD-10-CM

## 2024-08-07 DIAGNOSIS — O26893 Other specified pregnancy related conditions, third trimester: Secondary | ICD-10-CM

## 2024-08-07 DIAGNOSIS — O99113 Other diseases of the blood and blood-forming organs and certain disorders involving the immune mechanism complicating pregnancy, third trimester: Secondary | ICD-10-CM

## 2024-08-07 DIAGNOSIS — O0990 Supervision of high risk pregnancy, unspecified, unspecified trimester: Secondary | ICD-10-CM

## 2024-08-07 DIAGNOSIS — Z6791 Unspecified blood type, Rh negative: Secondary | ICD-10-CM | POA: Diagnosis not present

## 2024-08-07 NOTE — Progress Notes (Signed)
 "  PRENATAL VISIT NOTE  Subjective:  Danielle Sanchez is a 39 y.o. [phone] at [redacted]w[redacted]d being seen today for ongoing prenatal care.  She is currently monitored for the following issues for this high-risk pregnancy and has S/P biliopancreatic diversion with duodenal switch; Gestational thrombocytopenia; Rh negative state in antepartum period; Generalized anxiety disorder; Previous cesarean delivery affecting pregnancy, antepartum; Supervision of high risk pregnancy, antepartum; and History of gestational hypertension on their problem list.  Patient reports no complaints.  Contractions: Not present. Vag. Bleeding: None.  Movement: Present. Denies leaking of fluid.   The following portions of the patient's history were reviewed and updated as appropriate: allergies, current medications, past family history, past medical history, past social history, past surgical history and problem list.   Objective:   Vitals:   08/07/24 0833  BP: 108/76  Pulse: 76  Weight: 240 lb (108.9 kg)    Fetal Status:  Fetal Heart Rate (bpm): 150 Fundal Height: 36 cm Movement: Present    General: Alert, oriented and cooperative. Patient is in no acute distress.  Skin: Skin is warm and dry. No rash noted.   Cardiovascular: Normal heart rate noted  Respiratory: Normal respiratory effort, no problems with respiration noted  Abdomen: Soft, gravid, appropriate for gestational age.  Pain/Pressure: Present     Pelvic: Cervical exam deferred        Extremities: Normal range of motion.     Mental Status: Normal mood and affect. Normal behavior. Normal judgment and thought content.      03/11/2024   10:19 AM 02/05/2024    2:17 PM 02/13/2023    9:02 AM  Depression screen PHQ 2/9  Decreased Interest 0 0 0  Down, Depressed, Hopeless 1 1 0  PHQ - 2 Score 1 1 0  Altered sleeping 1 2 3   Tired, decreased energy 2 3 2   Change in appetite 0 3 1  Feeling bad or failure about yourself  0 2 0  Trouble concentrating 0 3 1  Moving  slowly or fidgety/restless 0 3 2  Suicidal thoughts 0 0 0  PHQ-9 Score 4  17  9    Difficult doing work/chores  Somewhat difficult Somewhat difficult     Data saved with a previous flowsheet row definition        03/11/2024   10:20 AM 02/05/2024    2:17 PM 02/13/2023    9:04 AM 12/19/2022    8:59 AM  GAD 7 : Generalized Anxiety Score  Nervous, Anxious, on Edge 0  1  1  3    Control/stop worrying 0  1  1  3    Worry too much - different things 0  1  1  3    Trouble relaxing 1  1  2  3    Restless 0  1  1  3    Easily annoyed or irritable 1  2  0  2   Afraid - awful might happen 0  1  0  2   Total GAD 7 Score 2 8 6 19   Anxiety Difficulty  Somewhat difficult Not difficult at all Somewhat difficult     Data saved with a previous flowsheet row definition    Assessment and Plan:  Pregnancy: [id number] at [redacted]w[redacted]d 1. Supervision of high risk pregnancy, antepartum (Primary) Continue prenatal care.  2. Benign gestational thrombocytopenia in third trimester Seeing oncology  3. Rh negative state in antepartum period S/p rhogam and pp if indicated  4. Previous cesarean delivery affecting pregnancy, antepartum Discussed  RCS--she has had 2 c-sections for fetal indications. Got to complete last time and attempted vacuum, but they did emergent c-section due to FHR in 80s and did not attempt for long Strongly desires attempt at [facility]. Booked for RCS at due date, TOLAC consent signed if spontaneously labors. Risks of each reviewed.  - Ambulatory Referral For Surgery Scheduling  5. Generalized anxiety disorder On Zoloft  and Buspar   6. [redacted] weeks gestation of pregnancy   Preterm labor symptoms and general obstetric precautions including but not limited to vaginal bleeding, contractions, leaking of fluid and fetal movement were reviewed in detail with the patient. Please refer to After Visit Summary for other counseling recommendations.   Return in 2 weeks (on 08/21/2024).  Future Appointments  Date  Time Provider Department Center  08/08/2024  8:40 AM Dineen Channel, PA-C [facility]  08/11/2024  9:00 AM [facility] None  08/11/2024  9:15 AM Melanee Annah BROCKS, MD [facility] None  08/21/2024  8:35 AM Fredirick Glenys RAMAN, MD [facility]  09/03/2024  8:35 AM Emilio Delilah HERO, CNM [facility]    Glenys RAMAN Fredirick, MD  "

## 2024-08-08 ENCOUNTER — Encounter: Payer: Self-pay | Admitting: Physician Assistant

## 2024-08-08 ENCOUNTER — Ambulatory Visit: Admitting: Physician Assistant

## 2024-08-08 ENCOUNTER — Encounter: Payer: Self-pay | Admitting: Family Medicine

## 2024-08-08 VITALS — BP 113/72 | HR 82 | Resp 16 | Ht 64.0 in | Wt 241.0 lb

## 2024-08-08 DIAGNOSIS — Z3A31 31 weeks gestation of pregnancy: Secondary | ICD-10-CM | POA: Insufficient documentation

## 2024-08-08 DIAGNOSIS — R0981 Nasal congestion: Secondary | ICD-10-CM

## 2024-08-08 DIAGNOSIS — Z8759 Personal history of other complications of pregnancy, childbirth and the puerperium: Secondary | ICD-10-CM

## 2024-08-08 DIAGNOSIS — Z1159 Encounter for screening for other viral diseases: Secondary | ICD-10-CM

## 2024-08-08 DIAGNOSIS — D696 Thrombocytopenia, unspecified: Secondary | ICD-10-CM

## 2024-08-08 DIAGNOSIS — K130 Diseases of lips: Secondary | ICD-10-CM

## 2024-08-08 DIAGNOSIS — Z7689 Persons encountering health services in other specified circumstances: Secondary | ICD-10-CM

## 2024-08-08 DIAGNOSIS — K5909 Other constipation: Secondary | ICD-10-CM | POA: Insufficient documentation

## 2024-08-08 DIAGNOSIS — F411 Generalized anxiety disorder: Secondary | ICD-10-CM

## 2024-08-11 ENCOUNTER — Inpatient Hospital Stay: Admitting: Oncology

## 2024-08-11 ENCOUNTER — Inpatient Hospital Stay

## 2024-08-21 ENCOUNTER — Encounter: Admitting: Family Medicine

## 2024-09-03 ENCOUNTER — Encounter: Admitting: Certified Nurse Midwife

## 2024-10-07 ENCOUNTER — Inpatient Hospital Stay (HOSPITAL_COMMUNITY): Admit: 2024-10-07

## 2024-12-15 ENCOUNTER — Ambulatory Visit: Admitting: Physician Assistant
